# Patient Record
Sex: Female | Born: 1970 | Race: White | Hispanic: No | Smoking: Current every day smoker
Health system: Southern US, Community
[De-identification: ages and names within clinical notes are randomized; demographics above are authoritative.]

## PROBLEM LIST (undated history)

## (undated) DIAGNOSIS — F419 Anxiety disorder, unspecified: Secondary | ICD-10-CM

## (undated) DIAGNOSIS — R112 Nausea with vomiting, unspecified: Secondary | ICD-10-CM

## (undated) DIAGNOSIS — G43909 Migraine, unspecified, not intractable, without status migrainosus: Secondary | ICD-10-CM

## (undated) DIAGNOSIS — F32A Depression, unspecified: Secondary | ICD-10-CM

## (undated) DIAGNOSIS — Z9889 Other specified postprocedural states: Secondary | ICD-10-CM

## (undated) DIAGNOSIS — K760 Fatty (change of) liver, not elsewhere classified: Secondary | ICD-10-CM

## (undated) DIAGNOSIS — F329 Major depressive disorder, single episode, unspecified: Secondary | ICD-10-CM

## (undated) DIAGNOSIS — N920 Excessive and frequent menstruation with regular cycle: Secondary | ICD-10-CM

## (undated) DIAGNOSIS — Z923 Personal history of irradiation: Secondary | ICD-10-CM

## (undated) DIAGNOSIS — D649 Anemia, unspecified: Secondary | ICD-10-CM

## (undated) DIAGNOSIS — T8859XA Other complications of anesthesia, initial encounter: Secondary | ICD-10-CM

## (undated) DIAGNOSIS — C801 Malignant (primary) neoplasm, unspecified: Secondary | ICD-10-CM

## (undated) DIAGNOSIS — G629 Polyneuropathy, unspecified: Secondary | ICD-10-CM

## (undated) DIAGNOSIS — T4145XA Adverse effect of unspecified anesthetic, initial encounter: Secondary | ICD-10-CM

## (undated) DIAGNOSIS — R51 Headache: Secondary | ICD-10-CM

## (undated) HISTORY — DX: Personal history of irradiation: Z92.3

## (undated) HISTORY — PX: CHOLECYSTECTOMY: SHX55

## (undated) HISTORY — DX: Excessive and frequent menstruation with regular cycle: N92.0

## (undated) HISTORY — PX: TUBAL LIGATION: SHX77

## (undated) HISTORY — DX: Anemia, unspecified: D64.9

---

## 1999-12-06 ENCOUNTER — Other Ambulatory Visit: Admission: RE | Admit: 1999-12-06 | Discharge: 1999-12-06 | Payer: Self-pay | Admitting: Family Medicine

## 2000-01-04 ENCOUNTER — Emergency Department (HOSPITAL_COMMUNITY): Admission: EM | Admit: 2000-01-04 | Discharge: 2000-01-05 | Payer: Self-pay | Admitting: Emergency Medicine

## 2001-05-26 ENCOUNTER — Other Ambulatory Visit: Admission: RE | Admit: 2001-05-26 | Discharge: 2001-05-26 | Payer: Self-pay | Admitting: Specialist

## 2008-04-27 ENCOUNTER — Inpatient Hospital Stay (HOSPITAL_COMMUNITY): Admission: AD | Admit: 2008-04-27 | Discharge: 2008-04-28 | Payer: Self-pay | Admitting: Obstetrics & Gynecology

## 2010-06-20 LAB — WET PREP, GENITAL: Clue Cells Wet Prep HPF POC: NONE SEEN

## 2010-06-20 LAB — CBC
HCT: 40 % (ref 36.0–46.0)
Hemoglobin: 13.5 g/dL (ref 12.0–15.0)
MCHC: 33.8 g/dL (ref 30.0–36.0)
MCV: 87.5 fL (ref 78.0–100.0)
RDW: 13.6 % (ref 11.5–15.5)

## 2010-06-20 LAB — GC/CHLAMYDIA PROBE AMP, GENITAL
Chlamydia, DNA Probe: NEGATIVE
GC Probe Amp, Genital: NEGATIVE

## 2010-06-20 LAB — SAMPLE TO BLOOD BANK

## 2012-11-14 ENCOUNTER — Emergency Department (HOSPITAL_BASED_OUTPATIENT_CLINIC_OR_DEPARTMENT_OTHER): Payer: Self-pay

## 2012-11-14 ENCOUNTER — Emergency Department (HOSPITAL_BASED_OUTPATIENT_CLINIC_OR_DEPARTMENT_OTHER)
Admission: EM | Admit: 2012-11-14 | Discharge: 2012-11-14 | Disposition: A | Discharge status: Home / Self Care | Payer: Self-pay | Attending: Emergency Medicine | Admitting: Emergency Medicine

## 2012-11-14 ENCOUNTER — Encounter (HOSPITAL_BASED_OUTPATIENT_CLINIC_OR_DEPARTMENT_OTHER): Payer: Self-pay | Admitting: Emergency Medicine

## 2012-11-14 DIAGNOSIS — N939 Abnormal uterine and vaginal bleeding, unspecified: Secondary | ICD-10-CM | POA: Insufficient documentation

## 2012-11-14 DIAGNOSIS — Z3202 Encounter for pregnancy test, result negative: Secondary | ICD-10-CM | POA: Insufficient documentation

## 2012-11-14 DIAGNOSIS — N949 Unspecified condition associated with female genital organs and menstrual cycle: Secondary | ICD-10-CM | POA: Insufficient documentation

## 2012-11-14 DIAGNOSIS — N926 Irregular menstruation, unspecified: Secondary | ICD-10-CM | POA: Insufficient documentation

## 2012-11-14 LAB — CBC WITH DIFFERENTIAL/PLATELET
Basophils Absolute: 0 10*3/uL (ref 0.0–0.1)
Eosinophils Relative: 3 % (ref 0–5)
Lymphocytes Relative: 28 % (ref 12–46)
Monocytes Relative: 7 % (ref 3–12)
Neutrophils Relative %: 62 % (ref 43–77)
Platelets: 529 10*3/uL — ABNORMAL HIGH (ref 150–400)
RBC: 4.42 MIL/uL (ref 3.87–5.11)
RDW: 17 % — ABNORMAL HIGH (ref 11.5–15.5)
WBC: 13.4 10*3/uL — ABNORMAL HIGH (ref 4.0–10.5)

## 2012-11-14 LAB — PREGNANCY, URINE: Preg Test, Ur: NEGATIVE

## 2012-11-14 MED ORDER — MEGESTROL ACETATE 40 MG PO TABS: ORAL_TABLET | ORAL | Status: DC

## 2012-11-14 MED ORDER — ONDANSETRON HCL 4 MG/2ML IJ SOLN
INTRAMUSCULAR | Status: AC
  Administered 2012-11-14: 4 mg
  Filled 2012-11-14: qty 2

## 2012-11-14 NOTE — ED Provider Notes (Signed)
CSN: [id number]     Arrival date & time 11/14/12  1929 History   First MD Initiated Contact with Patient 11/14/12 2121     Chief Complaint  Patient presents with  . Dysmenorrhea   (Consider location/radiation/quality/duration/timing/severity/associated sxs/prior Treatment) Patient is a 42 y.o. female presenting with vaginal bleeding. The history is provided by the patient. No language interpreter was used.  Vaginal Bleeding Quality:  Bright red Severity:  Moderate Onset quality:  Sudden Timing:  Constant Progression:  Worsening Chronicity:  New Number of pads used:  1 an hour Possible pregnancy: no   Relieved by:  Nothing Worsened by:  Nothing tried Associated symptoms: no abdominal pain   Risk factors: gynecological surgery     No past medical history on file. No past surgical history on file. No family history on file. History  Substance Use Topics  . Smoking status: Never Smoker   . Smokeless tobacco: Not on file  . Alcohol Use: No   OB History   Grav Para Term Preterm Abortions TAB SAB Ect Mult Living                 Review of Systems  Gastrointestinal: Negative for abdominal pain.  Genitourinary: Positive for vaginal bleeding and vaginal pain.  All other systems reviewed and are negative.    Allergies  Review of patient's allergies indicates no known allergies.  Home Medications   Current Outpatient Rx  Name  Route  Sig  Dispense  Refill  . ibuprofen (ADVIL,MOTRIN) 200 MG tablet   Oral   Take 400 mg by mouth every 6 (six) hours as needed for pain.          BP 125/53  Pulse 65  Temp(Src) 99.3 F (37.4 C) (Oral)  Resp 16  Ht 5\' 7"  (1.702 m)  Wt 239 lb (108.41 kg)  BMI 37.42 kg/m2  SpO2 100%  LMP 11/10/2012 Physical Exam  Nursing note and vitals reviewed. Constitutional: She is oriented to person, place, and time. She appears well-developed.  HENT:  Head: Normocephalic and atraumatic.  Eyes: Pupils are equal, round, and reactive to light.   Neck: Normal range of motion.  Cardiovascular: Normal rate and normal heart sounds.   Pulmonary/Chest: Effort normal and breath sounds normal.  Abdominal: Soft. Bowel sounds are normal.  Genitourinary: Vagina normal.  Vaginal bleeding  Moderate,  Uterus firm to palpation  Musculoskeletal: Normal range of motion.  Neurological: She is alert and oriented to person, place, and time. She has normal reflexes.  Skin: Skin is warm.  Psychiatric: She has a normal mood and affect.    ED Course  Procedures (including critical care time) Labs Review Labs Reviewed  CBC WITH DIFFERENTIAL - Abnormal; Notable for the following:    WBC 13.4 (*)    Hemoglobin 9.9 (*)    HCT 32.3 (*)    MCV 73.1 (*)    MCH 22.4 (*)    RDW 17.0 (*)    Platelets 529 (*)    Neutro Abs 8.3 (*)    All other components within normal limits  PREGNANCY, URINE   Imaging Review US Transvaginal Non-ob  11/14/2012   CLINICAL DATA:  Vaginal bleeding.  EXAM: TRANSABDOMINAL AND TRANSVAGINAL ULTRASOUND OF PELVIS  TECHNIQUE: Both transabdominal and transvaginal ultrasound examinations of the pelvis were performed. Transabdominal technique was performed for global imaging of the pelvis including uterus, ovaries, adnexal regions, and pelvic cul-de-sac. It was necessary to proceed with endovaginal exam following the transabdominal exam to visualize the  uterus, endometrium, ovaries and adnexa.  COMPARISON:  04/28/2008  FINDINGS: Uterus  Measurements: 10.4 x 6.1 x 7.1 cm. Mildly prominent. No focal abnormality. Normal echotexture.  Endometrium  Thickness: Markedly thickened, 30 mm mildly heterogeneous echotexture.  Right ovary  Measurements: 3.3 x 2.5 x 2.9 cm. Normal appearance/no adnexal mass.  Left ovary  Measurements: Unable to move visualized due to overlying bowel gas. No adnexal mass seen.  Other findings  No free fluid.  IMPRESSION: Markedly thickened endometrium at 3 mm. Consider further evaluation with endometrial biopsy.    Electronically Signed   By: Charlett Nose M.D.   On: 11/14/2012 22:41   US Pelvis Complete  11/14/2012   CLINICAL DATA:  Vaginal bleeding.  EXAM: TRANSABDOMINAL AND TRANSVAGINAL ULTRASOUND OF PELVIS  TECHNIQUE: Both transabdominal and transvaginal ultrasound examinations of the pelvis were performed. Transabdominal technique was performed for global imaging of the pelvis including uterus, ovaries, adnexal regions, and pelvic cul-de-sac. It was necessary to proceed with endovaginal exam following the transabdominal exam to visualize the uterus, endometrium, ovaries and adnexa.  COMPARISON:  04/28/2008  FINDINGS: Uterus  Measurements: 10.4 x 6.1 x 7.1 cm. Mildly prominent. No focal abnormality. Normal echotexture.  Endometrium  Thickness: Markedly thickened, 30 mm mildly heterogeneous echotexture.  Right ovary  Measurements: 3.3 x 2.5 x 2.9 cm. Normal appearance/no adnexal mass.  Left ovary  Measurements: Unable to move visualized due to overlying bowel gas. No adnexal mass seen.  Other findings  No free fluid.  IMPRESSION: Markedly thickened endometrium at 3 mm. Consider further evaluation with endometrial biopsy.   Electronically Signed   By: Charlett Nose M.D.   On: 11/14/2012 22:41    MDM   1. Abnormal vaginal bleeding    I spoke to dr. Despina Hidden.   Gyn on call.  He advised Megace 40 mg tid x 5 days,  Then bid x 5 days then one a day.   Call gyn clinic for evaluation.   [facility] increases    Elson Areas, PA-C 11/14/12 2301  Lonia Skinner [place], [place] 11/14/12 2311

## 2012-11-14 NOTE — ED Provider Notes (Signed)
Medical screening examination/treatment/procedure(s) were performed by non-physician practitioner and as supervising physician I was immediately available for consultation/collaboration.   Candyce Churn, MD 11/14/12 [phone]

## 2012-11-14 NOTE — ED Notes (Signed)
Pt is nauseated.

## 2012-11-14 NOTE — ED Notes (Signed)
Pt having severely painful menstrual cycles with heavy bleeding.  Pt having blood clots, fatigue, and bloating.  Pt states it has progressively worsened over the last 5 months.

## 2012-11-21 ENCOUNTER — Inpatient Hospital Stay (HOSPITAL_COMMUNITY)
Admission: AD | Admit: 2012-11-21 | Discharge: 2012-11-21 | Disposition: A | Discharge status: Home / Self Care | Payer: Self-pay | Source: Ambulatory Visit | Attending: Obstetrics & Gynecology | Admitting: Obstetrics & Gynecology

## 2012-11-21 ENCOUNTER — Encounter (HOSPITAL_COMMUNITY): Payer: Self-pay | Admitting: *Deleted

## 2012-11-21 DIAGNOSIS — N938 Other specified abnormal uterine and vaginal bleeding: Secondary | ICD-10-CM | POA: Insufficient documentation

## 2012-11-21 DIAGNOSIS — N949 Unspecified condition associated with female genital organs and menstrual cycle: Secondary | ICD-10-CM | POA: Insufficient documentation

## 2012-11-21 DIAGNOSIS — R9389 Abnormal findings on diagnostic imaging of other specified body structures: Secondary | ICD-10-CM | POA: Insufficient documentation

## 2012-11-21 DIAGNOSIS — R109 Unspecified abdominal pain: Secondary | ICD-10-CM | POA: Insufficient documentation

## 2012-11-21 DIAGNOSIS — N920 Excessive and frequent menstruation with regular cycle: Secondary | ICD-10-CM

## 2012-11-21 LAB — CBC
HCT: 26.2 % — ABNORMAL LOW (ref 36.0–46.0)
Hemoglobin: 8.2 g/dL — ABNORMAL LOW (ref 12.0–15.0)
WBC: 9.6 10*3/uL (ref 4.0–10.5)

## 2012-11-21 MED ORDER — OXYCODONE-ACETAMINOPHEN 5-325 MG PO TABS
1.0000 | ORAL_TABLET | Freq: Once | ORAL | Status: AC
  Administered 2012-11-21: 1 via ORAL
  Filled 2012-11-21: qty 1

## 2012-11-21 MED ORDER — MEGESTROL ACETATE 40 MG PO TABS: ORAL_TABLET | ORAL | Status: DC

## 2012-11-21 MED ORDER — OXYCODONE-ACETAMINOPHEN 5-325 MG PO TABS: 1.0000 | ORAL_TABLET | ORAL | Status: DC | PRN

## 2012-11-21 MED ORDER — PROMETHAZINE HCL 25 MG PO TABS: 25.0000 mg | ORAL_TABLET | Freq: Four times a day (QID) | ORAL | Status: DC | PRN

## 2012-11-21 MED ORDER — ONDANSETRON 8 MG PO TBDP
8.0000 mg | ORAL_TABLET | Freq: Once | ORAL | Status: AC
  Administered 2012-11-21: 8 mg via ORAL
  Filled 2012-11-21: qty 1

## 2012-11-21 NOTE — MAU Note (Signed)
Using public restroom when called for triage.

## 2012-11-21 NOTE — MAU Note (Signed)
Pt seen @ [facility] one week ago for bleeding, was given Megace to take for 5 days.  Pt finished med, but has still been bleeding.  Pt states her bleeding is very heavy, passing clots.  Has bled for past 10 days.

## 2012-11-21 NOTE — MAU Provider Note (Signed)
History     CSN: [id number]  Arrival date and time: 11/21/12 1542   First Provider Initiated Contact with Patient 11/21/12 1645      Chief Complaint  Patient presents with  . Vaginal Bleeding   HPI Ms. METTE SOUTHGATE is a 41 y.o. G2P2 who presents to MAU today with complaint of vaginal bleeding x 10 days. The patient states that she was seen at [facility] on 11/14/12 and given Megace TID x 5 days. This slowed the bleeding for 1-2 days, but it has since resumed. She states that she is changing her pad q 1.5-2 hours. She is feeling weak, dizzy, tired and has nausea, headaches and back pain. She is also having lower abdominal cramping rated at 6-7/10. She has taken ibuprofen without relief.    OB History   Grav Para Term Preterm Abortions TAB SAB Ect Mult Living   2 2        2       Past Medical History  Diagnosis Date  . Medical history non-contributory     Past Surgical History  Procedure Laterality Date  . Cesarean section    . Tubal ligation      History reviewed. No pertinent family history.  History  Substance Use Topics  . Smoking status: Current Every Day Smoker -- 0.50 packs/day    Types: Cigarettes  . Smokeless tobacco: Not on file  . Alcohol Use: No    Allergies: No Known Allergies  No prescriptions prior to admission    Review of Systems  Constitutional: Negative for fever and malaise/fatigue.  Gastrointestinal: Positive for nausea and abdominal pain. Negative for vomiting, diarrhea and constipation.  Genitourinary:       + vaginal bleeding  Neurological: Positive for dizziness. Negative for loss of consciousness.   Physical Exam   Blood pressure 131/71, pulse 90, temperature 97.9 F (36.6 C), temperature source Oral, resp. rate 20, last menstrual period 11/12/2012.  Physical Exam  Constitutional: She is oriented to person, place, and time. She appears well-developed and well-nourished. No distress.  HENT:  Head: Normocephalic and atraumatic.   Cardiovascular: Normal rate, regular rhythm and normal heart sounds.   Respiratory: Effort normal and breath sounds normal. No respiratory distress.  GI: Soft. Bowel sounds are normal. She exhibits no distension and no mass. There is no tenderness. There is no rebound and no guarding.  Genitourinary: Uterus is tender. Uterus is not enlarged (exam limited by maternal body habitus). Cervix exhibits no motion tenderness, no discharge and no friability. Right adnexum displays no mass and no tenderness. Left adnexum displays no mass and no tenderness. There is bleeding (small amount of bleeding noted) around the vagina. No vaginal discharge found.  Neurological: She is alert and oriented to person, place, and time.  Skin: Skin is warm and dry. No erythema.  Psychiatric: She has a normal mood and affect.   Results for orders placed during the hospital encounter of 11/21/12 (from the past 24 hour(s))  CBC     Status: Abnormal   Collection Time    11/21/12  6:20 PM      Result Value Range   WBC 9.6  4.0 - 10.5 K/uL   RBC 3.68 (*) 3.87 - 5.11 MIL/uL   Hemoglobin 8.2 (*) 12.0 - 15.0 g/dL   HCT 40.9 (*) 81.1 - 91.4 %   MCV 71.2 (*) 78.0 - 100.0 fL   MCH 22.3 (*) 26.0 - 34.0 pg   MCHC 31.3  30.0 - 36.0 g/dL  RDW 17.2 (*) 11.5 - 15.5 %   Platelets 541 (*) 150 - 400 K/uL    MAU Course  Procedures None  MDM [place] from 11/14/12 shows significant endometrial thickening Patient has appointment in [facility] clinic on 12/25/12. Will send inbasket message to move up date of appointment Discussed patient with Dr. Emelda Fear. Start on Megace TID x 7 days and then daily until clinic follow-up. Will need endometrial biopsy at that appointment.   Assessment and Plan  A: DUB  P: Discharge home Rx for Megace, Percocet and Phenergan given to the patient Patient will be contacted by [facility] clinic with new appointment date/time Bleeding precautions discussed Patient advised to continue taking previously prescribed iron  supplements Patient may return to MAU as needed or if her condition were to change or worsen  Freddi Starr, PA-C  11/21/2012, 7:14 PM

## 2012-11-22 NOTE — MAU Provider Note (Signed)
Attestation of Attending Supervision of Advanced Practitioner: Evaluation and management procedures were performed by the PA/NP/CNM/OB Fellow under my supervision/collaboration. Chart reviewed and agree with management and plan.  Kenesha Moshier V 11/22/2012 12:26 AM

## 2012-11-28 ENCOUNTER — Other Ambulatory Visit (HOSPITAL_COMMUNITY)
Admission: RE | Admit: 2012-11-28 | Discharge: 2012-11-28 | Disposition: A | Discharge status: Home / Self Care | Payer: Self-pay | Source: Ambulatory Visit | Attending: Obstetrics & Gynecology | Admitting: Obstetrics & Gynecology

## 2012-11-28 ENCOUNTER — Ambulatory Visit (INDEPENDENT_AMBULATORY_CARE_PROVIDER_SITE_OTHER): Payer: Self-pay | Admitting: Obstetrics & Gynecology

## 2012-11-28 ENCOUNTER — Encounter: Payer: Self-pay | Admitting: Obstetrics & Gynecology

## 2012-11-28 VITALS — BP 110/64 | HR 69 | Temp 97.4°F | Ht 67.0 in | Wt 248.2 lb

## 2012-11-28 DIAGNOSIS — R9389 Abnormal findings on diagnostic imaging of other specified body structures: Secondary | ICD-10-CM | POA: Insufficient documentation

## 2012-11-28 DIAGNOSIS — N920 Excessive and frequent menstruation with regular cycle: Secondary | ICD-10-CM | POA: Insufficient documentation

## 2012-11-28 DIAGNOSIS — Z01812 Encounter for preprocedural laboratory examination: Secondary | ICD-10-CM

## 2012-11-28 LAB — POCT PREGNANCY, URINE: Preg Test, Ur: NEGATIVE

## 2012-11-28 MED ORDER — AMITRIPTYLINE HCL 25 MG PO TABS: 25.0000 mg | ORAL_TABLET | Freq: Every day | ORAL | Status: DC

## 2012-11-28 NOTE — Progress Notes (Signed)
Here for menorrhagia, states still taking megace , but still bleeding a lot. Also reports no energy, and fighting off depression, and having nightmares and poor sleep.

## 2012-11-28 NOTE — Patient Instructions (Addendum)
Menorrhagia Dysfunctional uterine bleeding is different from a normal menstrual period. When periods are heavy or there is more bleeding than is usual for you, it is called menorrhagia. It may be caused by hormonal imbalance, or physical, metabolic, or other problems. Examination is necessary in order that your caregiver may treat treatable causes. If this is a continuing problem, a D&C may be needed. That means that the cervix (the opening of the uterus or womb) is dilated (stretched larger) and the lining of the uterus is scraped out. The tissue scraped out is then examined under a microscope by a specialist (pathologist) to make sure there is nothing of concern that needs further or more extensive treatment. HOME CARE INSTRUCTIONS   If medications were prescribed, take exactly as directed. Do not change or switch medications without consulting your caregiver.  Long term heavy bleeding may result in iron deficiency. Your caregiver may have prescribed iron pills. They help replace the iron your body lost from heavy bleeding. Take exactly as directed. Iron may cause constipation. If this becomes a problem, increase the bran, fruits, and roughage in your diet.  Do not take aspirin or medicines that contain aspirin one week before or during your menstrual period. Aspirin may make the bleeding worse.  If you need to change your sanitary pad or tampon more than once every 2 hours, stay in bed and rest as much as possible until the bleeding stops.  Eat well-balanced meals. Eat foods high in iron. Examples are leafy green vegetables, meat, liver, eggs, and whole grain breads and cereals. Do not try to lose weight until the abnormal bleeding has stopped and your blood iron level is back to normal. SEEK MEDICAL CARE IF:   You need to change your sanitary pad or tampon more than once an hour.  You develop nausea (feeling sick to your stomach) and vomiting, dizziness, or diarrhea while you are taking your  medicine.  You have any problems that may be related to the medicine you are taking. SEEK IMMEDIATE MEDICAL CARE IF:   You have a fever.  You develop chills.  You develop severe bleeding or start to pass blood clots.  You feel dizzy or faint. MAKE SURE YOU:   Understand these instructions.  Will watch your condition.  Will get help right away if you are not doing well or get worse. Document Released: 02/19/2005 Document Revised: 05/14/2011 Document Reviewed: 10/10/2007 [facility] Patient Information 2014 [place], [place]. Endometrial Ablation Endometrial ablation removes the lining of the uterus (endometrium). It is usually a same day, outpatient treatment. Ablation helps avoid major surgery (such as a hysterectomy). A hysterectomy is removal of the cervix and uterus. Endometrial ablation has less risk and complications, has a shorter recovery period and is less expensive. After endometrial ablation, most women will have little or no menstrual bleeding. You may not keep your fertility. Pregnancy is no longer likely after this procedure but if you are pre-menopausal, you still need to use a reliable method of birth control following the procedure because pregnancy can occur. REASONS TO HAVE THE PROCEDURE MAY INCLUDE:  Heavy periods.  Bleeding that is causing anemia.  Anovulatory bleeding, very irregular, bleeding.  Bleeding submucous fibroids (on the lining inside the uterus) if they are smaller than 3 centimeters. REASONS NOT TO HAVE THE PROCEDURE MAY INCLUDE:  You wish to have more children.  You have a pre-cancerous or cancerous problem. The cause of any abnormal bleeding must be diagnosed before having the procedure.  You  have pain coming from the uterus.  You have a submucus fibroid larger than 3 centimeters.  You recently had a baby.  You recently had an infection in the uterus.  You have a severe retro-flexed, tipped uterus and cannot insert the instrument to do the  ablation.  You had a Cesarean section or deep major surgery on the uterus.  The inner cavity of the uterus is too large for the endometrial ablation instrument. RISKS AND COMPLICATIONS   Perforation of the uterus.  Bleeding.  Infection of the uterus, bladder or vagina.  Injury to surrounding organs.  Cutting the cervix.  An air bubble to the lung (air embolus).  Pregnancy following the procedure.  Failure of the procedure to help the problem requiring hysterectomy.  Decreased ability to diagnose cancer in the lining of the uterus. BEFORE THE PROCEDURE  The lining of the uterus must be tested to make sure there is no pre-cancerous or cancer cells present.  Medications may be given to make the lining of the uterus thinner.  Ultrasound may be used to evaluate the size and look for abnormalities of the uterus.  Future pregnancy is not desired. PROCEDURE  There are different ways to destroy the lining of the uterus.   Resectoscope - radio frequency-alternating electric current is the most common one used.  Cryotherapy - freezing the lining of the uterus.  Heated Free Liquid - heated salt (saline) solution inserted into the uterus.  Microwave - uses high energy microwaves in the uterus.  Thermal Balloon - a catheter with a balloon tip is inserted into the uterus and filled with heated fluid. Your caregiver will talk with you about the method used in this clinic. They will also instruct you on the pros and cons of the procedure. Endometrial ablation is performed along with a procedure called operative hysteroscopy. A narrow viewing tube is inserted through the birth canal (vagina) and through the cervix into the uterus. A tiny camera attached to the viewing tube (hysteroscope) allows the uterine cavity to be shown on a TV monitor during surgery. Your uterus is filled with a harmless liquid to make the procedure easier. The lining of the uterus is then removed. The lining can  also be removed with a resectoscope which allows your surgeon to cut away the lining of the uterus under direct vision. Usually, you will be able to go home within an hour after the procedure. HOME CARE INSTRUCTIONS   Do not drive for 24 hours.  No tampons, douching or intercourse for 2 weeks or until your caregiver approves.  Rest at home for 24 to 48 hours. You may then resume normal activities unless told differently by your caregiver.  Take your temperature two times a day for 4 days, and record it.  Take any medications your caregiver has ordered, as directed.  Use some form of contraception if you are pre-menopausal and do not want to get pregnant. Bleeding after the procedure is normal. It varies from light spotting and mildly watery to bloody discharge for 4 to 6 weeks. You may also have mild cramping. Only take over-the-counter or prescription medicines for pain, discomfort, or fever as directed by your caregiver. Do not use aspirin, as this may aggravate bleeding. Frequent urination during the first 24 hours is normal. You will not know how effective your surgery is until at least 3 months after the surgery. SEEK IMMEDIATE MEDICAL CARE IF:   Bleeding is heavier than a normal menstrual cycle.  An oral  temperature above 102 F (38.9 C) develops.  You have increasing cramps or pains not relieved with medication or develop belly (abdominal) pain which does not seem to be related to the same area of earlier cramping and pain.  You are light headed, weak or have fainting episodes.  You develop pain in the shoulder strap areas.  You have chest or leg pain.  You have abnormal vaginal discharge.  You have painful urination. Document Released: 12/30/2003 Document Revised: 05/14/2011 Document Reviewed: 03/29/2007 [facility] Patient Information 2014 [place], [place]. Levonorgestrel intrauterine device (IUD) What is this medicine? LEVONORGESTREL IUD (LEE voe nor jes trel) is a  contraceptive (birth control) device. The device is placed inside the uterus by a healthcare professional. It is used to prevent pregnancy and can also be used to treat heavy bleeding that occurs during your period. Depending on the device, it can be used for 3 to 5 years. This medicine may be used for other purposes; ask your health care provider or pharmacist if you have questions. What should I tell my health care provider before I take this medicine? They need to know if you have any of these conditions: -abnormal Pap smear -cancer of the breast, uterus, or cervix -diabetes -endometritis -genital or pelvic infection now or in the past -have more than one sexual partner or your partner has more than one partner -heart disease -history of an ectopic or tubal pregnancy -immune system problems -IUD in place -liver disease or tumor -problems with blood clots or take blood-thinners -use intravenous drugs -uterus of unusual shape -vaginal bleeding that has not been explained -an unusual or allergic reaction to levonorgestrel, other hormones, silicone, or polyethylene, medicines, foods, dyes, or preservatives -pregnant or trying to get pregnant -breast-feeding How should I use this medicine? This device is placed inside the uterus by a health care professional. Talk to your pediatrician regarding the use of this medicine in children. Special care may be needed. Overdosage: If you think you have taken too much of this medicine contact a poison control center or emergency room at once. NOTE: This medicine is only for you. Do not share this medicine with others. What if I miss a dose? This does not apply. What may interact with this medicine? Do not take this medicine with any of the following medications: -amprenavir -bosentan -fosamprenavir This medicine may also interact with the following medications: -aprepitant -barbiturate medicines for inducing sleep or treating  seizures -bexarotene -griseofulvin -medicines to treat seizures like carbamazepine, ethotoin, felbamate, oxcarbazepine, phenytoin, topiramate -modafinil -pioglitazone -rifabutin -rifampin -rifapentine -some medicines to treat HIV infection like atazanavir, indinavir, lopinavir, nelfinavir, tipranavir, ritonavir -St. John's wort -warfarin This list may not describe all possible interactions. Give your health care provider a list of all the medicines, herbs, non-prescription drugs, or dietary supplements you use. Also tell them if you smoke, drink alcohol, or use illegal drugs. Some items may interact with your medicine. What should I watch for while using this medicine? Visit your doctor or health care professional for regular check ups. See your doctor if you or your partner has sexual contact with others, becomes HIV positive, or gets a sexual transmitted disease. This product does not protect you against HIV infection (AIDS) or other sexually transmitted diseases. You can check the placement of the IUD yourself by reaching up to the top of your vagina with clean fingers to feel the threads. Do not pull on the threads. It is a good habit to check placement after each menstrual period.  Call your doctor right away if you feel more of the IUD than just the threads or if you cannot feel the threads at all. The IUD may come out by itself. You may become pregnant if the device comes out. If you notice that the IUD has come out use a backup birth control method like condoms and call your health care provider. Using tampons will not change the position of the IUD and are okay to use during your period. What side effects may I notice from receiving this medicine? Side effects that you should report to your doctor or health care professional as soon as possible: -allergic reactions like skin rash, itching or hives, swelling of the face, lips, or tongue -fever, flu-like symptoms -genital sores -high  blood pressure -no menstrual period for 6 weeks during use -pain, swelling, warmth in the leg -pelvic pain or tenderness -severe or sudden headache -signs of pregnancy -stomach cramping -sudden shortness of breath -trouble with balance, talking, or walking -unusual vaginal bleeding, discharge -yellowing of the eyes or skin Side effects that usually do not require medical attention (report to your doctor or health care professional if they continue or are bothersome): -acne -breast pain -change in sex drive or performance -changes in weight -cramping, dizziness, or faintness while the device is being inserted -headache -irregular menstrual bleeding within first 3 to 6 months of use -nausea This list may not describe all possible side effects. Call your doctor for medical advice about side effects. You may report side effects to FDA at 1-800-FDA-1088. Where should I keep my medicine? This does not apply. NOTE: This sheet is a summary. It may not cover all possible information. If you have questions about this medicine, talk to your doctor, pharmacist, or health care provider.  2013, [employer]. (03/22/2011 1:54:04 PM)

## 2012-11-28 NOTE — Progress Notes (Signed)
Patient ID: Sheryl Mathis, female   DOB: [birth year], 42 y.o.   MRN: [id number]  Chief Complaint  Patient presents with  . Dysmenorrhea  . Menorrhagia    HPI Sheryl Mathis is a 42 y.o. female.  [id number] Patient's last menstrual period was 11/12/2012. Increasing menstrual bleeding and cramps over last few months, seen in ED twice and last 2 weeks, now on Megace taper with some improvement but still bleeding. Tearful and bad dreams since taking Megace.  HPI  Past Medical History  Diagnosis Date  . Medical history non-contributory   . Anemia   . Menorrhagia     Past Surgical History  Procedure Laterality Date  . Cesarean section    . Tubal ligation      Family History  Problem Relation Age of Onset  . Diabetes Mother     Social History History  Substance Use Topics  . Smoking status: Current Every Day Smoker -- 0.50 packs/day    Types: Cigarettes  . Smokeless tobacco: Not on file  . Alcohol Use: No    No Known Allergies  Current Outpatient Prescriptions  Medication Sig Dispense Refill  . Ferrous Sulfate (IRON) 28 MG TABS Take 1 tablet by mouth daily. 256 mg of ferrous gluconate      . ibuprofen (ADVIL,MOTRIN) 200 MG tablet Take 400 mg by mouth every 6 (six) hours as needed for pain.      . megestrol (MEGACE) 40 MG tablet Take 1 tab (40 mg) TID x 7 days and then 1 tab (40 mg) daily  60 tablet  0  . Multiple Vitamins-Minerals (ALIVE WOMENS ENERGY) TABS Take 1 tablet by mouth daily.      Marland Kitchen oxyCODONE-acetaminophen (PERCOCET/ROXICET) 5-325 MG per tablet Take 1 tablet by mouth every 4 (four) hours as needed for pain.  20 tablet  0  . promethazine (PHENERGAN) 25 MG tablet Take 1 tablet (25 mg total) by mouth every 6 (six) hours as needed for nausea.  30 tablet  0   No current facility-administered medications for this visit.    Review of Systems Review of Systems  Constitutional: Positive for fever.  Genitourinary: Positive for vaginal bleeding and pelvic pain (cramps).  Negative for dysuria, frequency and vaginal pain.  Psychiatric/Behavioral: Positive for sleep disturbance and dysphoric mood.    Blood pressure 110/64, pulse 69, temperature 97.4 F (36.3 C), height 5\' 7"  (1.702 m), weight 248 lb 3.2 oz (112.583 kg), last menstrual period 11/12/2012.  Physical Exam Physical Exam  Constitutional: She is oriented to person, place, and time. She appears well-developed. No distress.  obese  Pulmonary/Chest: Effort normal. No respiratory distress.  Abdominal: Soft. There is tenderness.  Genitourinary: Vagina normal and uterus normal.  Moderate bleeding, cervix normal, no mass  Neurological: She is alert and oriented to person, place, and time.  Skin: Skin is warm and dry.  Psychiatric: She has a normal mood and affect. Her behavior is normal.   Patient given informed consent, signed copy in the chart, time out was performed. Appropriate time out taken. . The patient was placed in the lithotomy position and the cervix brought into view with sterile speculum.  Portio of cervix cleansed x 2 with betadine swabs.  A tenaculum was placed in the anterior lip of the cervix.  The uterus was sounded for depth of 10 cm. A pipelle was introduced to into the uterus, suction created,  and an endometrial sample was obtained, with blood. All equipment was removed and accounted for.  The patient tolerated the procedure well.    Patient given post procedure instructions. The patient will return in 2 weeks for results. Data Reviewed   AssesCLINICAL DATA: Vaginal bleeding.  EXAM:  TRANSABDOMINAL AND TRANSVAGINAL ULTRASOUND OF PELVIS  TECHNIQUE:  Both transabdominal and transvaginal ultrasound examinations of the  pelvis were performed. Transabdominal technique was performed for  global imaging of the pelvis including uterus, ovaries, adnexal  regions, and pelvic cul-de-sac. It was necessary to proceed with  endovaginal exam following the transabdominal exam to visualize the   uterus, endometrium, ovaries and adnexa.  COMPARISON: 04/28/2008  FINDINGS:  Uterus  Measurements: 10.4 x 6.1 x 7.1 cm. Mildly prominent. No focal  abnormality. Normal echotexture.  Endometrium  Thickness: Markedly thickened, 30 mm mildly heterogeneous  echotexture.  Right ovary  Measurements: 3.3 x 2.5 x 2.9 cm. Normal appearance/no adnexal mass.  Left ovary  Measurements: Unable to move visualized due to overlying bowel gas.  No adnexal mass seen.  Other findings  No free fluid.  IMPRESSION:  Markedly thickened endometrium at 3 mm. Consider further evaluation  with endometrial biopsy.  Electronically Signed  By: Charlett Nose M.D.  On: 11/14/2012 22:41     Thickened endometrium, menorrhagia   Mood side-effect from progestin  Plan    RTC for EMB result, consider Mirena, ablation Amitriptyline 25 mg po hs sleep       ARNOLD,JAMES 11/28/2012, 10:16 AM

## 2012-12-01 ENCOUNTER — Encounter: Payer: Self-pay | Admitting: Obstetrics and Gynecology

## 2012-12-09 ENCOUNTER — Telehealth: Payer: Self-pay | Admitting: *Deleted

## 2012-12-09 NOTE — Telephone Encounter (Addendum)
Message copied by Jill Side on Tue Dec 09, 2012 11:47 AM ------      Message from: Adam Phenix      Created: Fri Dec 05, 2012 11:50 AM       RTC for result, benign endometrium ------ Called pt and left message that Dr. Debroah Loop would like her to call and schedule f/u appt in clinic to discuss test results and formulate plan of care. Please call the appt line to schedule the appt.

## 2012-12-15 ENCOUNTER — Inpatient Hospital Stay (HOSPITAL_COMMUNITY)
Admission: AD | Admit: 2012-12-15 | Discharge: 2012-12-15 | Disposition: A | Discharge status: Home / Self Care | Payer: Self-pay | Source: Ambulatory Visit | Attending: Obstetrics & Gynecology | Admitting: Obstetrics & Gynecology

## 2012-12-15 ENCOUNTER — Encounter (HOSPITAL_COMMUNITY): Payer: Self-pay | Admitting: *Deleted

## 2012-12-15 DIAGNOSIS — N938 Other specified abnormal uterine and vaginal bleeding: Secondary | ICD-10-CM | POA: Insufficient documentation

## 2012-12-15 DIAGNOSIS — R51 Headache: Secondary | ICD-10-CM | POA: Insufficient documentation

## 2012-12-15 DIAGNOSIS — N920 Excessive and frequent menstruation with regular cycle: Secondary | ICD-10-CM

## 2012-12-15 DIAGNOSIS — N949 Unspecified condition associated with female genital organs and menstrual cycle: Secondary | ICD-10-CM | POA: Insufficient documentation

## 2012-12-15 DIAGNOSIS — R112 Nausea with vomiting, unspecified: Secondary | ICD-10-CM | POA: Insufficient documentation

## 2012-12-15 DIAGNOSIS — N970 Female infertility associated with anovulation: Secondary | ICD-10-CM

## 2012-12-15 HISTORY — DX: Major depressive disorder, single episode, unspecified: F32.9

## 2012-12-15 HISTORY — DX: Depression, unspecified: F32.A

## 2012-12-15 HISTORY — DX: Headache: R51

## 2012-12-15 LAB — CBC WITH DIFFERENTIAL/PLATELET
Basophils Absolute: 0.1 10*3/uL (ref 0.0–0.1)
Basophils Relative: 1 % (ref 0–1)
HCT: 25.5 % — ABNORMAL LOW (ref 36.0–46.0)
Hemoglobin: 7.6 g/dL — ABNORMAL LOW (ref 12.0–15.0)
Lymphocytes Relative: 30 % (ref 12–46)
MCHC: 29.8 g/dL — ABNORMAL LOW (ref 30.0–36.0)
Monocytes Absolute: 0.8 10*3/uL (ref 0.1–1.0)
Neutro Abs: 5.9 10*3/uL (ref 1.7–7.7)
Neutrophils Relative %: 58 % (ref 43–77)
RDW: 17.2 % — ABNORMAL HIGH (ref 11.5–15.5)
WBC: 10.2 10*3/uL (ref 4.0–10.5)

## 2012-12-15 LAB — URINALYSIS, ROUTINE W REFLEX MICROSCOPIC
Glucose, UA: NEGATIVE mg/dL
Ketones, ur: NEGATIVE mg/dL
Nitrite: NEGATIVE
Protein, ur: NEGATIVE mg/dL
pH: 6 (ref 5.0–8.0)

## 2012-12-15 LAB — URINE MICROSCOPIC-ADD ON

## 2012-12-15 MED ORDER — BUTALBITAL-APAP-CAFFEINE 50-325-40 MG PO TABS: 1.0000 | ORAL_TABLET | Freq: Four times a day (QID) | ORAL | Status: DC | PRN

## 2012-12-15 MED ORDER — NORETHINDRONE-ETH ESTRADIOL 0.4-35 MG-MCG PO TABS: 1.0000 | ORAL_TABLET | Freq: Every day | ORAL | Status: DC

## 2012-12-15 MED ORDER — IRON 28 MG PO TABS: 1.0000 | ORAL_TABLET | Freq: Two times a day (BID) | ORAL | Status: DC

## 2012-12-15 MED ORDER — BUTALBITAL-APAP-CAFFEINE 50-325-40 MG PO TABS
1.0000 | ORAL_TABLET | Freq: Once | ORAL | Status: AC
  Administered 2012-12-15: 1 via ORAL
  Filled 2012-12-15: qty 1

## 2012-12-15 NOTE — MAU Note (Signed)
"  same as before"; headache, nausea, passing clots, fatigue. meds make her sleepy.  Makes it hard to work. Going on for over 34days.

## 2012-12-15 NOTE — MAU Provider Note (Signed)
History     CSN: [id number]  Arrival date and time: 12/15/12 1435   None     Chief Complaint  Patient presents with  . Headache  . Nausea  . Fatigue  . Vaginal Bleeding   HPI [name] is a 42 y.o female that presents for HA, N/V, and vaginal bleeding. She says she has large clots that come out periodically. She says that she normally goes through in a day is 10. She denies any other discharge. She was prescribed megace for vaginal bleeding on 11/14/12. She says that helped the bleeding some but has not resolved it. She was biopsied in clinic but she has not gotten the results yet. The headaches come on progressively during the day. She is positive for light sensitivity but not sensitive for sound. She complains of nausea and vomiting with the headaches. She denies aura. She takes Advil PRN for the HA but it does not work. She denies previous diagnosis or FH of migraines.    OB History   Grav Para Term Preterm Abortions TAB SAB Ect Mult Living   2 2 2       2       Past Medical History  Diagnosis Date  . Medical history non-contributory   . Anemia   . Menorrhagia   . Headache(784.0)   . Depression     Past Surgical History  Procedure Laterality Date  . Cesarean section    . Tubal ligation      Family History  Problem Relation Age of Onset  . Diabetes Mother     History  Substance Use Topics  . Smoking status: Current Every Day Smoker -- 0.50 packs/day    Types: Cigarettes  . Smokeless tobacco: Not on file  . Alcohol Use: No    Allergies: No Known Allergies  Prescriptions prior to admission  Medication Sig Dispense Refill  . amitriptyline (ELAVIL) 25 MG tablet Take 1 tablet (25 mg total) by mouth at bedtime.  20 tablet  1  . Ferrous Sulfate (IRON) 28 MG TABS Take 1 tablet by mouth daily. 256 mg of ferrous gluconate      . ibuprofen (ADVIL,MOTRIN) 200 MG tablet Take 400 mg by mouth every 6 (six) hours as needed for pain.      . megestrol (MEGACE) 40 MG tablet  Take 1 tab (40 mg) TID x 7 days and then 1 tab (40 mg) daily  60 tablet  0  . Multiple Vitamins-Minerals (ALIVE WOMENS ENERGY) TABS Take 1 tablet by mouth daily.      Marland Kitchen oxyCODONE-acetaminophen (PERCOCET/ROXICET) 5-325 MG per tablet Take 1 tablet by mouth every 4 (four) hours as needed for pain.  20 tablet  0  . promethazine (PHENERGAN) 25 MG tablet Take 1 tablet (25 mg total) by mouth every 6 (six) hours as needed for nausea.  30 tablet  0    Review of Systems  Constitutional: Negative for fever and chills.  HENT: Negative for congestion and sore throat.   Respiratory: Negative for cough, shortness of breath and wheezing.   Cardiovascular: Negative for chest pain and palpitations.  Gastrointestinal: Positive for nausea, vomiting and abdominal pain. Negative for heartburn, diarrhea and constipation.  Genitourinary: Negative for dysuria, urgency and frequency.  Skin: Negative.  Negative for itching and rash.  Neurological: Positive for headaches.   Physical Exam   Blood pressure 128/84, pulse 87, temperature 98.9 F (37.2 C), temperature source Oral, resp. rate 20, height 5' 6.5" (1.689 m), weight 111.585  kg (246 lb), last menstrual period 11/12/2012.  Physical Exam  Constitutional: She is oriented to person, place, and time. She appears well-developed and well-nourished. No distress.  HENT:  Head: Normocephalic and atraumatic.  Cardiovascular: Normal rate, regular rhythm, normal heart sounds and intact distal pulses.   Respiratory: Effort normal and breath sounds normal.  GI: Soft. Bowel sounds are normal.  Neurological: She is alert and oriented to person, place, and time.  Skin: Skin is warm and dry. She is not diaphoretic.  Psychiatric: She has a normal mood and affect.    MAU Course  Procedures   Assessment and Plan   Patient is a 42 y.o female that present for vaginal bleeding and HA. Collect   Bing Plume 12/15/2012, 4:44 PM   CBC    Component Value Date/Time    WBC 10.2 12/15/2012 1703   RBC 3.57* 12/15/2012 1703   HGB 7.6* 12/15/2012 1703   HCT 25.5* 12/15/2012 1703   PLT 367 12/15/2012 1703   MCV 71.4* 12/15/2012 1703   MCH 21.3* 12/15/2012 1703   MCHC 29.8* 12/15/2012 1703   RDW 17.2* 12/15/2012 1703   LYMPHSABS 3.1 12/15/2012 1703   MONOABS 0.8 12/15/2012 1703   EOSABS 0.3 12/15/2012 1703   BASOSABS 0.1 12/15/2012 1703    Pt slowly down trending but hemodynamically stable. Megace not stopping the bleeding. Reviewed EMB, no hyperplasia or malignancy. Will start ovcon35 taper for treatment. Will also give fioricet for headache. Pt states understanding. -ovcon 35 3tabs for 3days 2 tabs for 3days 1 tab daily to complete pack and next pack. May take sugar pills and continue next pack. - fioricet for headaches.  F/u for worsening symptoms. Scheduled to see Dr. Debroah Loop on 30October for definitive treatment.  Tawana Scale, MD OB Fellow

## 2012-12-15 NOTE — MAU Note (Signed)
Pt tearful with unrelieved headache ; took 400mg  advil this morning .

## 2012-12-23 NOTE — MAU Provider Note (Signed)
Pt should not take placebo pills during the first pack cut can take them for the second pack.    Agree with above note.  Brookie Wayment H. 12/23/2012 9:04 AM

## 2012-12-25 ENCOUNTER — Encounter: Payer: Self-pay | Admitting: Medical

## 2012-12-31 ENCOUNTER — Encounter (HOSPITAL_COMMUNITY): Payer: Self-pay | Admitting: *Deleted

## 2012-12-31 ENCOUNTER — Inpatient Hospital Stay (HOSPITAL_COMMUNITY)
Admission: AD | Admit: 2012-12-31 | Discharge: 2013-01-01 | DRG: 744 | Disposition: A | Discharge status: Home / Self Care | Payer: Self-pay | Source: Ambulatory Visit | Attending: Obstetrics & Gynecology | Admitting: Obstetrics & Gynecology

## 2012-12-31 DIAGNOSIS — N926 Irregular menstruation, unspecified: Secondary | ICD-10-CM | POA: Diagnosis present

## 2012-12-31 DIAGNOSIS — N920 Excessive and frequent menstruation with regular cycle: Secondary | ICD-10-CM

## 2012-12-31 DIAGNOSIS — N390 Urinary tract infection, site not specified: Secondary | ICD-10-CM | POA: Diagnosis present

## 2012-12-31 DIAGNOSIS — D500 Iron deficiency anemia secondary to blood loss (chronic): Secondary | ICD-10-CM | POA: Diagnosis present

## 2012-12-31 DIAGNOSIS — R9389 Abnormal findings on diagnostic imaging of other specified body structures: Secondary | ICD-10-CM | POA: Diagnosis present

## 2012-12-31 DIAGNOSIS — E039 Hypothyroidism, unspecified: Secondary | ICD-10-CM | POA: Diagnosis present

## 2012-12-31 DIAGNOSIS — N921 Excessive and frequent menstruation with irregular cycle: Secondary | ICD-10-CM

## 2012-12-31 LAB — CBC
HCT: 22.8 % — ABNORMAL LOW (ref 36.0–46.0)
Hemoglobin: 9 g/dL — ABNORMAL LOW (ref 12.0–15.0)
MCHC: 29.8 g/dL — ABNORMAL LOW (ref 30.0–36.0)
MCV: 74.5 fL — ABNORMAL LOW (ref 78.0–100.0)
MCV: 77 fL — ABNORMAL LOW (ref 78.0–100.0)
Platelets: 370 10*3/uL (ref 150–400)
Platelets: 302 10*3/uL (ref 150–400)
RBC: 3.06 MIL/uL — ABNORMAL LOW (ref 3.87–5.11)
RBC: 3.65 MIL/uL — ABNORMAL LOW (ref 3.87–5.11)
RDW: 19.9 % — ABNORMAL HIGH (ref 11.5–15.5)
WBC: 11.8 10*3/uL — ABNORMAL HIGH (ref 4.0–10.5)
WBC: 8.4 10*3/uL (ref 4.0–10.5)

## 2012-12-31 LAB — URINALYSIS, ROUTINE W REFLEX MICROSCOPIC
Bilirubin Urine: NEGATIVE
Glucose, UA: NEGATIVE mg/dL
Nitrite: POSITIVE — AB
Specific Gravity, Urine: >1.03 — ABNORMAL HIGH (ref 1.005–1.030)
Urobilinogen, UA: 0.2 mg/dL (ref 0.0–1.0)
pH: 7 (ref 5.0–8.0)

## 2012-12-31 LAB — URINE MICROSCOPIC-ADD ON

## 2012-12-31 LAB — TSH: TSH: 7.378 u[IU]/mL — ABNORMAL HIGH (ref 0.350–4.500)

## 2012-12-31 LAB — POCT PREGNANCY, URINE: Preg Test, Ur: NEGATIVE

## 2012-12-31 LAB — PREPARE RBC (CROSSMATCH)

## 2012-12-31 MED ORDER — KETOROLAC TROMETHAMINE 60 MG/2ML IM SOLN
60.0000 mg | Freq: Once | INTRAMUSCULAR | Status: AC
  Administered 2012-12-31: 60 mg via INTRAMUSCULAR
  Filled 2012-12-31: qty 2

## 2012-12-31 MED ORDER — IBUPROFEN 600 MG PO TABS
600.0000 mg | ORAL_TABLET | Freq: Four times a day (QID) | ORAL | Status: DC | PRN
  Administered 2012-12-31 (×3): 600 mg via ORAL
  Filled 2012-12-31 (×3): qty 1

## 2012-12-31 MED ORDER — PROMETHAZINE HCL 25 MG PO TABS
25.0000 mg | ORAL_TABLET | Freq: Four times a day (QID) | ORAL | Status: DC | PRN
  Administered 2013-01-01: 25 mg via ORAL
  Filled 2012-12-31: qty 1

## 2012-12-31 MED ORDER — ACETAMINOPHEN 325 MG PO TABS
650.0000 mg | ORAL_TABLET | Freq: Once | ORAL | Status: AC
  Administered 2012-12-31: 650 mg via ORAL
  Filled 2012-12-31: qty 2

## 2012-12-31 MED ORDER — DIPHENHYDRAMINE HCL 25 MG PO CAPS
25.0000 mg | ORAL_CAPSULE | Freq: Once | ORAL | Status: AC
  Administered 2012-12-31: 25 mg via ORAL
  Filled 2012-12-31: qty 1

## 2012-12-31 MED ORDER — PROMETHAZINE HCL 25 MG/ML IJ SOLN
12.5000 mg | Freq: Four times a day (QID) | INTRAMUSCULAR | Status: DC | PRN
  Administered 2013-01-01: 12.5 mg via INTRAVENOUS
  Filled 2012-12-31: qty 1

## 2012-12-31 MED ORDER — AMITRIPTYLINE HCL 25 MG PO TABS
25.0000 mg | ORAL_TABLET | Freq: Every day | ORAL | Status: DC
  Administered 2013-01-01: 25 mg via ORAL
  Filled 2012-12-31 (×2): qty 1

## 2012-12-31 MED ORDER — INFLUENZA VAC SPLIT QUAD 0.5 ML IM SUSP
0.5000 mL | INTRAMUSCULAR | Status: AC
  Administered 2012-12-31: 0.5 mL via INTRAMUSCULAR
  Filled 2012-12-31: qty 0.5

## 2012-12-31 MED ORDER — OXYCODONE-ACETAMINOPHEN 5-325 MG PO TABS
1.0000 | ORAL_TABLET | ORAL | Status: DC | PRN
  Administered 2012-12-31 (×2): 2 via ORAL
  Administered 2013-01-01: 1 via ORAL
  Filled 2012-12-31 (×2): qty 2
  Filled 2012-12-31: qty 1
  Filled 2012-12-31: qty 2

## 2012-12-31 MED ORDER — PNEUMOCOCCAL VAC POLYVALENT 25 MCG/0.5ML IJ INJ
0.5000 mL | INJECTION | INTRAMUSCULAR | Status: AC
  Administered 2012-12-31: 0.5 mL via INTRAMUSCULAR
  Filled 2012-12-31 (×2): qty 0.5

## 2012-12-31 MED ORDER — ESTROGENS CONJUGATED 25 MG IJ SOLR
25.0000 mg | Freq: Four times a day (QID) | INTRAMUSCULAR | Status: DC
  Administered 2012-12-31 – 2013-01-01 (×2): 25 mg via INTRAVENOUS
  Filled 2012-12-31 (×7): qty 25

## 2012-12-31 MED ORDER — SODIUM CHLORIDE 0.9 % IV SOLN: Freq: Once | INTRAVENOUS | Status: DC

## 2012-12-31 MED ORDER — SODIUM CHLORIDE 0.9 % IV SOLN
INTRAVENOUS | Status: DC
  Administered 2013-01-01: via INTRAVENOUS

## 2012-12-31 MED ORDER — CEFAZOLIN SODIUM 1-5 GM-% IV SOLN
1.0000 g | Freq: Three times a day (TID) | INTRAVENOUS | Status: DC
  Administered 2012-12-31 – 2013-01-01 (×3): 1 g via INTRAVENOUS
  Filled 2012-12-31 (×4): qty 50

## 2012-12-31 MED ORDER — MEDROXYPROGESTERONE ACETATE 10 MG PO TABS
10.0000 mg | ORAL_TABLET | Freq: Two times a day (BID) | ORAL | Status: DC
  Administered 2012-12-31 (×2): 10 mg via ORAL
  Filled 2012-12-31 (×3): qty 1

## 2012-12-31 NOTE — Progress Notes (Signed)
Lysteda unavailable, will start i.v. Premarin 25 mg iv q 6h.

## 2012-12-31 NOTE — Progress Notes (Signed)
Subjective: Patient reports continued passage of large clots with pain.  Toilet bowl has several large clots up to 5 cm, passed in last 2 hours. Still cramping.Marland Kitchen   Has received 3 units prbc currently.  Also , pt having uti sx of frequency.  Urinalysis    Component Value Date/Time   COLORURINE RED* 12/31/2012 0307   APPEARANCEUR CLOUDY* 12/31/2012 0307   LABSPEC >1.030* 12/31/2012 0307   PHURINE 7.0 12/31/2012 0307   GLUCOSEU NEGATIVE 12/31/2012 0307   HGBUR LARGE* 12/31/2012 0307   BILIRUBINUR NEGATIVE 12/31/2012 0307   KETONESUR NEGATIVE 12/31/2012 0307   PROTEINUR >300* 12/31/2012 0307   UROBILINOGEN 0.2 12/31/2012 0307   NITRITE POSITIVE* 12/31/2012 0307   LEUKOCYTESUR TRACE* 12/31/2012 0307       Objective: I have reviewed patient's vital signs, medications, labs, pathology and clots passed. CBC    Component Value Date/Time   WBC 8.4 12/31/2012 2205   RBC 3.65* 12/31/2012 2205   HGB 9.0* 12/31/2012 2205   HCT 28.1* 12/31/2012 2205   PLT 302 12/31/2012 2205   MCV 77.0* 12/31/2012 2205   MCH 24.7* 12/31/2012 2205   MCHC 32.0 12/31/2012 2205   RDW 18.2* 12/31/2012 2205   LYMPHSABS 3.1 12/15/2012 1703   MONOABS 0.8 12/15/2012 1703   EOSABS 0.3 12/15/2012 1703   BASOSABS 0.1 12/15/2012 1703     General: alert, cooperative, mild distress and uncomfortable with cramping Resp: unlabored breathing GI: soft, non-tender; bowel sounds normal; no masses,  no organomegaly Vaginal Bleeding: moderate and heavy   Assessment/Plan:  Persistent menorrhagia, still actively passing clots. UTI, warranting tx.  I feel pt should be kept overnight and given additional agents, will add lysteda, keep pt NPO in am, and consider proceeding with hysteroscopy, D&C, and Endometrial Ablation as previously planned per Dr Debroah Loop. I am concerned that if pt sent home with persistent bleeding at this level, further transfusion to be required. Will place  NPO p MN.    LOS: 0 days     Carolan Avedisian V 12/31/2012, 11:00 PM

## 2012-12-31 NOTE — Progress Notes (Signed)
Ur chart review completed.  

## 2012-12-31 NOTE — MAU Note (Signed)
Pt reports bleeding since 09/12. But today bleeding has increased. Pt has an appointment w/ Dr. Debroah Loop on Thursday.

## 2012-12-31 NOTE — MAU Provider Note (Signed)
Agree with note  James G Arnold, MD . 

## 2012-12-31 NOTE — Progress Notes (Signed)
Admission nutrition screen triggered for weight loss. Patients chart reviewed and assessed  for nutritional risk. Medical record indicates a 20 lb weight gain since 11/12/12. Patient is determined to be at low nutrition  risk.   Elisabeth Cara M.Odis Luster LDN Neonatal Nutrition Support Specialist Pager [phone]

## 2012-12-31 NOTE — MAU Note (Signed)
CRITICAL VALUE ALERT  Critical value received:  Hemoglobin 6.8  Date of notification:  10/29  Time of notification:  0414  Critical value read back:yes  Nurse who received alert:  Judeth Horn [facility]  Dorathy Kinsman CNM notified at [phone]

## 2012-12-31 NOTE — MAU Provider Note (Signed)
Chief Complaint: Vaginal Bleeding   First Provider Initiated Contact with Patient 12/31/12 0413     SUBJECTIVE HPI: Sheryl Mathis is a 42 y.o. [id number] female who presents with heavy vaginal bleeding with clots, low abdominal cramping and passing clots, dizziness, generalized weakness and generalized headaches. Started bleeding 11/14/2012. Seen in ED in MAU 3 times total for same. Seen in [facility] hospital outpatient clinic. Endometrial biopsy negative for malignancy. Has followup visit in clinic with Dr. Debroah Loop tomorrow to discuss definitive management, but had significant worsening of symptoms tonight and felt that she needed emergent care.  Was initially placed on Megace for symptoms. Experienced poor control of bleeding and side effects of depression and interference with sleep. Was placed on OCP taper 2 weeks ago also with little improvement bleeding and has struggled with significant headaches and nausea on the multiple pill days. Has continued on one pill per day and skipped sugar pills as instructed, but bleeding has worsened.   Hemoglobin ~13 when patient not experiencing menorrhagia. Has trended down from 9's-7's over the past 6 weeks.  Past Medical History  Diagnosis Date  . Anemia   . Menorrhagia   . Headache(784.0)   . Depression    OB History  Gravida Para Term Preterm AB SAB TAB Ectopic Multiple Living  2 2 2       2     # Outcome Date GA Lbr Len/2nd Weight Sex Delivery Anes PTL Lv  2 TRM 1995     CS        Comments: breech  1 TRM 1990     CS        Comments: breech     Past Surgical History  Procedure Laterality Date  . Cesarean section    . Tubal ligation     History   Social History  . Marital Status: Legally Separated    Spouse Name: N/A    Number of Children: N/A  . Years of Education: N/A   Occupational History  . Not on file.   Social History Main Topics  . Smoking status: Current Every Day Smoker -- 0.50 packs/day    Types: Cigarettes  . Smokeless  tobacco: Not on file  . Alcohol Use: No  . Drug Use: No  . Sexual Activity: Not Currently    Birth Control/ Protection: Surgical   Other Topics Concern  . Not on file   Social History Narrative  . No narrative on file   No current facility-administered medications on file prior to encounter.   Current Outpatient Prescriptions on File Prior to Encounter  Medication Sig Dispense Refill  . amitriptyline (ELAVIL) 25 MG tablet Take 1 tablet (25 mg total) by mouth at bedtime.  20 tablet  1  . butalbital-acetaminophen-caffeine (FIORICET) 50-325-40 MG per tablet Take 1-2 tablets by mouth every 6 (six) hours as needed for headache.  20 tablet  0  . Ferrous Sulfate (IRON) 28 MG TABS Take 1 tablet (28 mg total) by mouth 2 (two) times daily. 256 mg of ferrous gluconate  30 each  2  . ibuprofen (ADVIL,MOTRIN) 200 MG tablet Take 400 mg by mouth every 6 (six) hours as needed for pain.      . Multiple Vitamins-Minerals (ALIVE WOMENS ENERGY) TABS Take 1 tablet by mouth daily.      . norethindrone-ethinyl estradiol (OVCON-35, 28,) 0.4-35 MG-MCG tablet Take 1 tablet by mouth daily.  1 Package  3  . oxyCODONE-acetaminophen (PERCOCET/ROXICET) 5-325 MG per tablet Take 1 tablet by  mouth every 4 (four) hours as needed for pain.  20 tablet  0  . promethazine (PHENERGAN) 25 MG tablet Take 1 tablet (25 mg total) by mouth every 6 (six) hours as needed for nausea.  30 tablet  0   No Known Allergies  ROS: Pertinent positive items in HPI. In addition, the patient reports vulvar irritation that she attributes to long-term maxi pad use. Negative for fever, chills, syncope, neck stiffness, difficulties with speech or gait urinary complaints or GI complaints. Denies any other history of heavy bleeding.  OBJECTIVE Blood pressure 122/68, pulse 87, temperature 99.1 F (37.3 C), temperature source Oral, resp. rate 18, SpO2 100.00%.  Patient Vitals for the past 24 hrs:  BP Temp Temp src Pulse Resp SpO2  12/31/12 0359  120/66 mmHg - - 98 - -  12/31/12 0358 122/68 mmHg - - 87 - -  12/31/12 0357 127/59 mmHg - - 84 - -  12/31/12 0335 - - - - - 100 %  12/31/12 0326 120/46 mmHg 99.1 F (37.3 C) Oral 89 18 -    GENERAL: Well-developed, well-nourished female in no acute distress. Pale. HEENT: Normocephalic HEART: normal rate and rhythm. RESP: normal effort, clear to auscultation bilaterally. ABDOMEN: Soft, non-tender EXTREMITIES: Nontender, no edema NEURO: Alert and oriented SPECULUM EXAM: NEFG, moderate amount of bright red blood and 5 cm clot noted, cervix clean. No cervical polyps. BIMANUAL: cervix closed; uterus size difficult to assess due to body habitus. Nontender. No adnexal tenderness or masses. No cervical motion tenderness.  LAB RESULTS Results for orders placed during the hospital encounter of 12/31/12 (from the past 24 hour(s))  CBC     Status: Abnormal   Collection Time    12/31/12  3:50 AM      Result Value Range   WBC 11.8 (*) 4.0 - 10.5 K/uL   RBC 3.06 (*) 3.87 - 5.11 MIL/uL   Hemoglobin 6.8 (*) 12.0 - 15.0 g/dL   HCT 28.4 (*) 13.2 - 44.0 %   MCV 74.5 (*) 78.0 - 100.0 fL   MCH 22.2 (*) 26.0 - 34.0 pg   MCHC 29.8 (*) 30.0 - 36.0 g/dL   RDW 10.2 (*) 72.5 - 36.6 %   Platelets 370  150 - 400 K/uL  POCT PREGNANCY, URINE     Status: None   Collection Time    12/31/12  4:39 AM      Result Value Range   Preg Test, Ur NEGATIVE  NEGATIVE    IMAGING 11/14/2012: Ultrasound showed markedly thickened endometrium at 3 mm.  MAU COURSE Discussed emergent versus long-term management of menorrhagia and anemia. Patient stated multiple times that she is feeling worse and worse despite taking Megace, OCPs and iron supplements as directed. Discussed IV fluids and increasing iron supplementation versus transfusion for symptoms of anemia and risks and benefits of each including risk of infection or transfusion reaction from transfusion. Patient requesting transfusion.   Discussed Depo-Provera, Mirena  and ablation for long-term management. Patient very interested in ablation. Encourage patient to discuss this with Dr. Debroah Loop of her appointment tomorrow as this cannot be done as an emergent procedure.  Discussed patient's symptoms, history, hemoglobin and vital signs with Dr. Debroah Loop and informed him of her strong desire for transfusion. Will admit for observation on third floor and administer 3 units of packed red blood cells and start Provera by mouth.  Toradol given for cramping.  ASSESSMENT 1. Menorrhagia with irregular cycle   2. Anemia due to chronic blood loss  3. UTI (urinary tract infection), uncomplicated    PLAN Admit for observation on third floor for transfusion. Provera  Cipro Dr. Debroah Loop to follow. TSH pending.  Dorathy Kinsman, CNM 12/31/2012  4:00 AM

## 2012-12-31 NOTE — H&P (Signed)
Chief Complaint: Vaginal Bleeding   First Provider Initiated Contact with Patient 12/31/12 0413     SUBJECTIVE HPI: Sheryl Mathis is a 42 y.o. [id number] female who presents with heavy vaginal bleeding with clots, low abdominal cramping and passing clots, dizziness, generalized weakness and generalized headaches. Started bleeding 11/14/2012. Seen in ED in MAU 3 times total for same. Seen in [facility] hospital outpatient clinic. Endometrial biopsy negative for malignancy. Has followup visit in clinic with Dr. Debroah Loop tomorrow to discuss definitive management, but had significant worsening of symptoms tonight and felt that she needed emergent care.  Was initially placed on Megace for symptoms. Experienced poor control of bleeding and side effects of depression and interference with sleep. Was placed on OCP taper 2 weeks ago also with little improvement bleeding and has struggled with significant headaches and nausea on the multiple pill days. Has continued on one pill per day and skipped sugar pills as instructed, but bleeding has worsened.   Hemoglobin ~13 when patient not experiencing menorrhagia. Has trended down from 9's-7's over the past 6 weeks.  Past Medical History  Diagnosis Date  . Anemia   . Menorrhagia   . Headache(784.0)   . Depression    OB History  Gravida Para Term Preterm AB SAB TAB Ectopic Multiple Living  2 2 2       2     # Outcome Date GA Lbr Len/2nd Weight Sex Delivery Anes PTL Lv  2 TRM 1995     CS        Comments: breech  1 TRM 1990     CS        Comments: breech     Past Surgical History  Procedure Laterality Date  . Cesarean section    . Tubal ligation     History   Social History  . Marital Status: Legally Separated    Spouse Name: N/A    Number of Children: N/A  . Years of Education: N/A   Occupational History  . Not on file.   Social History Main Topics  . Smoking status: Current Every Day Smoker -- 0.50 packs/day    Types: Cigarettes  . Smokeless  tobacco: Not on file  . Alcohol Use: No  . Drug Use: No  . Sexual Activity: Not Currently    Birth Control/ Protection: Surgical   Other Topics Concern  . Not on file   Social History Narrative  . No narrative on file   No current facility-administered medications on file prior to encounter.   Current Outpatient Prescriptions on File Prior to Encounter  Medication Sig Dispense Refill  . amitriptyline (ELAVIL) 25 MG tablet Take 1 tablet (25 mg total) by mouth at bedtime.  20 tablet  1  . butalbital-acetaminophen-caffeine (FIORICET) 50-325-40 MG per tablet Take 1-2 tablets by mouth every 6 (six) hours as needed for headache.  20 tablet  0  . Ferrous Sulfate (IRON) 28 MG TABS Take 1 tablet (28 mg total) by mouth 2 (two) times daily. 256 mg of ferrous gluconate  30 each  2  . ibuprofen (ADVIL,MOTRIN) 200 MG tablet Take 400 mg by mouth every 6 (six) hours as needed for pain.      . Multiple Vitamins-Minerals (ALIVE WOMENS ENERGY) TABS Take 1 tablet by mouth daily.      . norethindrone-ethinyl estradiol (OVCON-35, 28,) 0.4-35 MG-MCG tablet Take 1 tablet by mouth daily.  1 Package  3  . oxyCODONE-acetaminophen (PERCOCET/ROXICET) 5-325 MG per tablet Take 1 tablet by  mouth every 4 (four) hours as needed for pain.  20 tablet  0  . promethazine (PHENERGAN) 25 MG tablet Take 1 tablet (25 mg total) by mouth every 6 (six) hours as needed for nausea.  30 tablet  0   No Known Allergies  ROS: Pertinent positive items in HPI. In addition, the patient reports vulvar irritation that she attributes to long-term maxi pad use. Negative for fever, chills, syncope, neck stiffness, difficulties with speech or gait urinary complaints or GI complaints. Denies any other history of heavy bleeding.  OBJECTIVE Blood pressure 122/68, pulse 87, temperature 99.1 F (37.3 C), temperature source Oral, resp. rate 18, SpO2 100.00%.  Patient Vitals for the past 24 hrs:  BP Temp Temp src Pulse Resp SpO2  12/31/12 0359  120/66 mmHg - - 98 - -  12/31/12 0358 122/68 mmHg - - 87 - -  12/31/12 0357 127/59 mmHg - - 84 - -  12/31/12 0335 - - - - - 100 %  12/31/12 0326 120/46 mmHg 99.1 F (37.3 C) Oral 89 18 -    GENERAL: Well-developed, well-nourished female in no acute distress. Pale. HEENT: Normocephalic HEART: normal rate and rhythm. RESP: normal effort, clear to auscultation bilaterally. ABDOMEN: Soft, non-tender EXTREMITIES: Nontender, no edema NEURO: Alert and oriented SPECULUM EXAM: NEFG, moderate amount of bright red blood and 5 cm clot noted, cervix clean. No cervical polyps. BIMANUAL: cervix closed; uterus size difficult to assess due to body habitus. Nontender. No adnexal tenderness or masses. No cervical motion tenderness.  LAB RESULTS Results for orders placed during the hospital encounter of 12/31/12 (from the past 24 hour(s))  CBC     Status: Abnormal   Collection Time    12/31/12  3:50 AM      Result Value Range   WBC 11.8 (*) 4.0 - 10.5 K/uL   RBC 3.06 (*) 3.87 - 5.11 MIL/uL   Hemoglobin 6.8 (*) 12.0 - 15.0 g/dL   HCT 86.5 (*) 78.4 - 69.6 %   MCV 74.5 (*) 78.0 - 100.0 fL   MCH 22.2 (*) 26.0 - 34.0 pg   MCHC 29.8 (*) 30.0 - 36.0 g/dL   RDW 29.5 (*) 28.4 - 13.2 %   Platelets 370  150 - 400 K/uL  POCT PREGNANCY, URINE     Status: None   Collection Time    12/31/12  4:39 AM      Result Value Range   Preg Test, Ur NEGATIVE  NEGATIVE    IMAGING 11/14/2012: Ultrasound showed markedly thickened endometrium at 3 mm.  MAU COURSE Discussed emergent versus long-term management of menorrhagia and anemia. Patient stated multiple times that she is feeling worse and worse despite taking Megace, OCPs and iron supplements as directed. Discussed IV fluids and increasing iron supplementation versus transfusion for symptoms of anemia and risks and benefits of each including risk of infection or transfusion reaction from transfusion. Patient requesting transfusion.   Discussed Depo-Provera, Mirena  and ablation for long-term management. Patient very interested in ablation. Encourage patient to discuss this with Dr. Debroah Loop of her appointment tomorrow as this cannot be done as an emergent procedure.  Discussed patient's symptoms, history, hemoglobin and vital signs with Dr. Debroah Loop and informed him of her strong desire for transfusion. Will admit for observation on third floor and administer 3 units of packed red blood cells and start Provera by mouth.  Toradol given for cramping.  ASSESSMENT 1. Menorrhagia with irregular cycle   2. Anemia due to chronic blood loss  3.  Uncomplicated UTI  PLAN Admit for observation on third floor for transfusion. Provera Cipro Dr. Debroah Loop to follow. TSH pending.  Dorathy Kinsman, CNM 12/31/2012  4:00 AM

## 2013-01-01 ENCOUNTER — Inpatient Hospital Stay (HOSPITAL_COMMUNITY): Payer: No Typology Code available for payment source | Admitting: Anesthesiology

## 2013-01-01 ENCOUNTER — Ambulatory Visit: Payer: Self-pay | Admitting: Obstetrics & Gynecology

## 2013-01-01 ENCOUNTER — Encounter (HOSPITAL_COMMUNITY)
Admission: AD | Disposition: A | Discharge status: Home / Self Care | Payer: Self-pay | Source: Ambulatory Visit | Attending: Obstetrics & Gynecology

## 2013-01-01 ENCOUNTER — Encounter (HOSPITAL_COMMUNITY): Payer: Self-pay | Admitting: Registered Nurse

## 2013-01-01 ENCOUNTER — Encounter (HOSPITAL_COMMUNITY): Payer: Self-pay | Admitting: Anesthesiology

## 2013-01-01 DIAGNOSIS — R9389 Abnormal findings on diagnostic imaging of other specified body structures: Secondary | ICD-10-CM

## 2013-01-01 DIAGNOSIS — N920 Excessive and frequent menstruation with regular cycle: Principal | ICD-10-CM

## 2013-01-01 HISTORY — PX: DILITATION & CURRETTAGE/HYSTROSCOPY WITH HYDROTHERMAL ABLATION: SHX5570

## 2013-01-01 LAB — CBC
HCT: 25.4 % — ABNORMAL LOW (ref 36.0–46.0)
Hemoglobin: 8.2 g/dL — ABNORMAL LOW (ref 12.0–15.0)
MCH: 25.8 pg — ABNORMAL LOW (ref 26.0–34.0)
MCHC: 33.4 g/dL (ref 30.0–36.0)
MCV: 76.7 fL — ABNORMAL LOW (ref 78.0–100.0)
MCV: 77.1 fL — ABNORMAL LOW (ref 78.0–100.0)
Platelets: 324 10*3/uL (ref 150–400)
RDW: 18.1 % — ABNORMAL HIGH (ref 11.5–15.5)
WBC: 10.7 10*3/uL — ABNORMAL HIGH (ref 4.0–10.5)

## 2013-01-01 LAB — BASIC METABOLIC PANEL
BUN: 12 mg/dL (ref 6–23)
Chloride: 105 mEq/L (ref 96–112)
GFR calc Af Amer: >90 mL/min (ref >90)
Glucose, Bld: 93 mg/dL (ref 70–99)
Potassium: 4.2 mEq/L (ref 3.5–5.1)
Sodium: 135 mEq/L (ref 135–145)

## 2013-01-01 SURGERY — DILATATION & CURETTAGE/HYSTEROSCOPY WITH HYDROTHERMAL ABLATION
Anesthesia: General | Site: Vagina | Wound class: Clean Contaminated

## 2013-01-01 MED ORDER — SODIUM CHLORIDE 0.9 % IR SOLN
Status: DC | PRN
  Administered 2013-01-01: 3000 mL

## 2013-01-01 MED ORDER — DEXAMETHASONE SODIUM PHOSPHATE 10 MG/ML IJ SOLN
INTRAMUSCULAR | Status: DC | PRN
  Administered 2013-01-01: 10 mg via INTRAVENOUS

## 2013-01-01 MED ORDER — IBUPROFEN 600 MG PO TABS: 600.0000 mg | ORAL_TABLET | Freq: Four times a day (QID) | ORAL | Status: DC | PRN

## 2013-01-01 MED ORDER — KETOROLAC TROMETHAMINE 30 MG/ML IJ SOLN
INTRAMUSCULAR | Status: DC | PRN
  Administered 2013-01-01: 30 mg via INTRAVENOUS

## 2013-01-01 MED ORDER — ONDANSETRON HCL 4 MG/2ML IJ SOLN
INTRAMUSCULAR | Status: DC | PRN
  Administered 2013-01-01: 4 mg via INTRAVENOUS

## 2013-01-01 MED ORDER — DEXAMETHASONE SODIUM PHOSPHATE 10 MG/ML IJ SOLN
INTRAMUSCULAR | Status: AC
  Filled 2013-01-01: qty 1

## 2013-01-01 MED ORDER — FENTANYL CITRATE 0.05 MG/ML IJ SOLN
25.0000 ug | INTRAMUSCULAR | Status: DC | PRN
  Administered 2013-01-01: 50 ug via INTRAVENOUS

## 2013-01-01 MED ORDER — LACTATED RINGERS IV SOLN
INTRAVENOUS | Status: DC
  Administered 2013-01-01: 125 mL/h via INTRAVENOUS
  Administered 2013-01-01 (×2): via INTRAVENOUS

## 2013-01-01 MED ORDER — ONDANSETRON HCL 4 MG/2ML IJ SOLN
INTRAMUSCULAR | Status: AC
  Filled 2013-01-01: qty 2

## 2013-01-01 MED ORDER — DOCUSATE SODIUM 100 MG PO CAPS: 100.0000 mg | ORAL_CAPSULE | Freq: Two times a day (BID) | ORAL | Status: DC | PRN

## 2013-01-01 MED ORDER — PROPOFOL 10 MG/ML IV EMUL
INTRAVENOUS | Status: AC
  Filled 2013-01-01: qty 20

## 2013-01-01 MED ORDER — FENTANYL CITRATE 0.05 MG/ML IJ SOLN
INTRAMUSCULAR | Status: DC | PRN
  Administered 2013-01-01 (×2): 50 ug via INTRAVENOUS

## 2013-01-01 MED ORDER — LIDOCAINE HCL (CARDIAC) 20 MG/ML IV SOLN
INTRAVENOUS | Status: DC | PRN
  Administered 2013-01-01: 80 mg via INTRAVENOUS

## 2013-01-01 MED ORDER — BUPIVACAINE HCL (PF) 0.5 % IJ SOLN
INTRAMUSCULAR | Status: AC
  Filled 2013-01-01: qty 30

## 2013-01-01 MED ORDER — MEPERIDINE HCL 25 MG/ML IJ SOLN: 6.2500 mg | INTRAMUSCULAR | Status: DC | PRN

## 2013-01-01 MED ORDER — MIDAZOLAM HCL 2 MG/2ML IJ SOLN
INTRAMUSCULAR | Status: AC
  Filled 2013-01-01: qty 2

## 2013-01-01 MED ORDER — BUPIVACAINE HCL 0.5 % IJ SOLN
INTRAMUSCULAR | Status: DC | PRN
  Administered 2013-01-01: 30 mL

## 2013-01-01 MED ORDER — FENTANYL CITRATE 0.05 MG/ML IJ SOLN
INTRAMUSCULAR | Status: AC
  Filled 2013-01-01: qty 2

## 2013-01-01 MED ORDER — DEXTROSE 5 % IV SOLN
INTRAVENOUS | Status: DC | PRN
  Administered 2013-01-01: 250 mL

## 2013-01-01 MED ORDER — PROPOFOL 10 MG/ML IV BOLUS
INTRAVENOUS | Status: DC | PRN
  Administered 2013-01-01: 200 mg via INTRAVENOUS

## 2013-01-01 MED ORDER — PROMETHAZINE HCL 25 MG/ML IJ SOLN: 6.2500 mg | INTRAMUSCULAR | Status: DC | PRN

## 2013-01-01 MED ORDER — KETOROLAC TROMETHAMINE 30 MG/ML IJ SOLN: 15.0000 mg | Freq: Once | INTRAMUSCULAR | Status: DC | PRN

## 2013-01-01 MED ORDER — OXYCODONE-ACETAMINOPHEN 5-325 MG PO TABS: 1.0000 | ORAL_TABLET | ORAL | Status: DC | PRN

## 2013-01-01 MED ORDER — KETOROLAC TROMETHAMINE 30 MG/ML IJ SOLN
INTRAMUSCULAR | Status: AC
  Filled 2013-01-01: qty 1

## 2013-01-01 MED ORDER — MIDAZOLAM HCL 2 MG/2ML IJ SOLN: 0.5000 mg | Freq: Once | INTRAMUSCULAR | Status: DC | PRN

## 2013-01-01 MED ORDER — LIDOCAINE HCL (CARDIAC) 20 MG/ML IV SOLN
INTRAVENOUS | Status: AC
  Filled 2013-01-01: qty 5

## 2013-01-01 MED ORDER — MIDAZOLAM HCL 5 MG/5ML IJ SOLN
INTRAMUSCULAR | Status: DC | PRN
  Administered 2013-01-01: 2 mg via INTRAVENOUS

## 2013-01-01 SURGICAL SUPPLY — 15 items
CATH ROBINSON RED A/P 16FR (CATHETERS) ×2 IMPLANT
CLOTH BEACON ORANGE TIMEOUT ST (SAFETY) ×2 IMPLANT
CONTAINER PREFILL 10% NBF 60ML (FORM) ×4 IMPLANT
DRAPE HYSTEROSCOPY (DRAPE) ×2 IMPLANT
DRESSING TELFA 8X3 (GAUZE/BANDAGES/DRESSINGS) ×2 IMPLANT
GLOVE BIO SURGEON STRL SZ7 (GLOVE) ×2 IMPLANT
GOWN STRL REIN XL XLG (GOWN DISPOSABLE) ×6 IMPLANT
NEEDLE SPNL 20GX3.5 QUINCKE YW (NEEDLE) ×2 IMPLANT
NEEDLE SPNL 22GX3.5 QUINCKE BK (NEEDLE) ×2 IMPLANT
PACK VAGINAL MINOR WOMEN LF (CUSTOM PROCEDURE TRAY) ×2 IMPLANT
PAD OB MATERNITY 4.3X12.25 (PERSONAL CARE ITEMS) ×2 IMPLANT
SET GENESYS HTA PROCERVA (MISCELLANEOUS) ×2 IMPLANT
SYR CONTROL 10ML LL (SYRINGE) ×2 IMPLANT
TOWEL OR 17X24 6PK STRL BLUE (TOWEL DISPOSABLE) ×4 IMPLANT
WATER STERILE IRR 1000ML POUR (IV SOLUTION) ×2 IMPLANT

## 2013-01-01 NOTE — Preoperative (Signed)
Beta Blockers   Reason not to administer Beta Blockers:Not Applicable 

## 2013-01-01 NOTE — Interval H&P Note (Signed)
History and Physical Interval Note 01/01/2013  1:08 PM  Sheryl Mathis  has presented today for surgery, with the diagnosis of abnormal uterine bleeding  The various methods of treatment have been discussed with the patient and family. After consideration of risks, benefits and other options for treatment, the patient has consented to: DILATATION & CURETTAGE/HYSTEROSCOPY WITH HYDROTHERMAL ABLATION  as a surgical intervention.  The patient's history has been reviewed, patient examined, no change in status, stable for surgery.  I have reviewed the patient's chart and labs.  Questions were answered to the patient's satisfaction.  To OR when ready.  Jaynie Collins, MD, FACOG Attending Obstetrician & Gynecologist Faculty Practice, [facility] [place]

## 2013-01-01 NOTE — Anesthesia Postprocedure Evaluation (Signed)
  Anesthesia Post Note  Patient: Sheryl Mathis  Procedure(s) Performed: Procedure(s) (LRB): DILATATION & CURETTAGE/HYSTEROSCOPY WITH ATTEMPTED HYDROTHERMAL ABLATION: Change over to THERMACHOICE ABLATION @ 1505 (N/A)  Anesthesia type: GA  Patient location: PACU  Post pain: Pain level controlled  Post assessment: Post-op Vital signs reviewed  Last Vitals:  Filed Vitals:   01/01/13 1645  BP:   Pulse:   Temp:   Resp: 24    Post vital signs: Reviewed  Level of consciousness: sedated  Complications: No apparent anesthesia complications

## 2013-01-01 NOTE — Op Note (Signed)
PREOPERATIVE DIAGNOSIS:  Abnormal uterine bleeding POSTOPERATIVE DIAGNOSIS: The same PROCEDURE: Dilation and Curettage, Hysteroscopy, Failed Hydrothermal andThermachoice Endometrial Ablation SURGEON:  Dr. Jaynie Collins  INDICATIONS: 42 y.o. [id number] here for scheduled surgery for abnormal uterine bleeding. Risks of surgery were discussed with the patient including but not limited to: bleeding; infection which may require antibiotics; injury to uterus leading to risk of injury to surrounding intraperitoneal organs and discontinuation of endometrial ablation, burn injury to vagina or other organs, need for additional procedures including laparoscopy or laparotomy, and other postoperative/anesthesia complications. Patient was informed that there is a high likelihood of success of controlling her symptoms; however about 5% of patients may require further intervention.  Written informed consent was obtained.    FINDINGS:  A 10 week size uterus.  Diffuse proliferative endometrium.  Normal ostia bilaterally.  No uterine perforation noted on hysteroscopy.    ANESTHESIA:   General, paracervical block with 30 ml of 0.5% Marcaine INTRAVENOUS FLUIDS:  600 ml of LR ESTIMATED BLOOD LOSS:  25 ml  SPECIMENS: Endometrial curettings sent to pathology COMPLICATIONS:  Likely occult uterine rupture not visualized on hysteroscopy; two endometrial ablation modalities could not proceed.  PROCEDURE DETAILS:  The patient was then taken to the operating room where general anesthesia was administered and was found to be adequate.  After an adequate timeout was performed, she was placed in the dorsal lithotomy position and examined; then prepped and draped in the sterile manner.   Her bladder was catheterized for clear, yellow urine. A speculum was then placed in the patient's vagina and a single tooth tenaculum was applied to the anterior lip of the cervix.   A paracervical block using 30 ml of 0.5% Marcaine was administered.   The cervix was sounded to 9 cm and dilated manually with metal dilators to accommodate the hydrothermal ablation hysteroscopic apparatus.  Once the cervix was dilated, a sharp curettage was then performed to obtain a moderate amount of endometrial curettings.  The hysteroscope was inserted under direct visualization using normal saline as a suspension medium.  The uterine cavity was carefully examined, both ostia were recognized, and diffusely proliferative endometrium was noted.   The hydrothermal ablation was attempted to be carried out but did not proceed due to possible uterine rupture.  Daignostic hysteroscopy was done and no rupture was visualized. However, the hydrothermal abaltion procedure still did not proceed even after placed tenaculum on the posterior lip of cervix and occluding the cervical canal.  The procedure was aborted and the decision was made to try another endometrial ablation ablation modality.  The Thermachoice apparatus was primed as per protocol, and the ballon was inserted into the endometrial cavity.  The balloon was inflated with D5W as instructed but the ablation did not proceed after inflation.  This raised concern for an occult uterine perforation, so endometrial ablation was aborted.  No other complications were observed.  The tenaculums were removed from the cervix, and the vaginal speculum was removed after noting good hemostasis.  The patient tolerated the procedure well and was taken to the recovery area awake, extubated and in stable condition.  Patient and her family will be informed of failure of endometrial ablation.  A repeat endometrial ablation procedure can be attempted in about 4 weeks, or patient will be given an option to proceed with definitive surgical management with hysterectomy.  Her uterus and cervix exhibited good descensus; she is a great candidate for vaginal hysterectomy if desired.    Jaynie Collins, MD, Evern Core  Attending Obstetrician &  Gynecologist Faculty Practice, [facility] [place]

## 2013-01-01 NOTE — H&P (View-Only) (Signed)
Subjective: Patient reports bleeding has improved since  [name]. Premarin started. C/o nausea, receiving [name] phenergan with premarin [name]. .    Objective: I have reviewed patient's vital signs, intake and output and labs. CBC    Component Value Date/Time   WBC 10.7* 01/01/2013 0530   RBC 3.31* 01/01/2013 0530   HGB 8.2* 01/01/2013 0530   HCT 25.4* 01/01/2013 0530   PLT 266 01/01/2013 0530   MCV 76.7* 01/01/2013 0530   MCH 24.8* 01/01/2013 0530   MCHC 32.3 01/01/2013 0530   RDW 18.2* 01/01/2013 0530   LYMPHSABS 3.1 12/15/2012 1703   MONOABS 0.8 12/15/2012 1703   EOSABS 0.3 12/15/2012 1703   BASOSABS 0.1 12/15/2012 1703    tsh elevated at 7.378  General: alert and no distress GI: soft, non-tender; bowel sounds normal; no masses,  no organomegaly Extremities: Homans sign is negative, no sign of DVT Vaginal Bleeding: minimal Pathology of Endometrial Biopsy 9/26:  No evidence of malignancy or hyperplasia, Fragments of endometrioid type tissue consistent with hormonal effect  Assessment/Plan: Menorrhagia with irregular cycle, Anemia due to menorrhagia, partially compensated s/p 3 units prbc UTI Hypothyroidism   Plan: Transfuse 4th unit prbc         NPO, will attempt to proceed with hysteroscopy d&C and endometrial ablation later today, Dr Macon Large aware of case. Patient informed that if bleeding controlled and OR schedule remains busy today, may need to reschedule D&C. Keep NPO for now.     LOS: 1 day    Sheryl Mathis 01/01/2013, 8:43 AM

## 2013-01-01 NOTE — Transfer of Care (Signed)
Immediate Anesthesia Transfer of Care Note  Patient: Sheryl Mathis  Procedure(s) Performed: Procedure(s): DILATATION & CURETTAGE/HYSTEROSCOPY WITH ATTEMPTED HYDROTHERMAL ABLATION: Change over to THERMACHOICE ABLATION @ 1505 (N/A)  Patient Location: PACU  Anesthesia Type:General  Level of Consciousness: awake, alert  and oriented  Airway & Oxygen Therapy: Patient Spontanous Breathing and Patient connected to nasal cannula oxygen  Post-op Assessment: Report given to PACU RN, Post -op Vital signs reviewed and stable and Patient moving all extremities  Post vital signs: Reviewed and stable  Complications: No apparent anesthesia complications

## 2013-01-01 NOTE — Discharge Summary (Signed)
Gynecology Physician Discharge Summary  Patient ID: Sheryl Mathis MRN: [id number] DOB/AGE: [birth year] 42 y.o.  Admit date: 12/31/2012 Discharge date: 01/01/2013  Preoperative Diagnoses: Menorrhagia; 3 cm thickened endometrium  Procedure(s) (LRB): DILATATION & CURETTAGE/HYSTEROSCOPY WITH FAILED ATTEMPTS OF HYDROTHERMAL ABLATION ANDTHERMACHOICE ABLATION  Consults: None  Significant Diagnostic Studies:   Recent Labs Lab 12/31/12 2205 01/01/13 0530 01/01/13 1808  WBC 8.4 10.7* 14.1*  HGB 9.0* 8.2* 10.6*  HCT 28.1* 25.4* 31.7*  PLT 302 266 324     Hospital Course:  TATIANNA Mathis is a 42 y.o. [id number]  admitted for bleeding on 12/31/12.  Initial Hgb was 6.8, she received a total of 4 units of pRBCs, discharge Hgb was 10.6.  She was treated with IV Premarin and scheduled for endometrial ablation.  She underwent the procedures as mentioned above, her operation was complicated by possible occult uterine perforation which prohibited endometrial ablation. For further details about surgery, please refer to the operative report. Patient had an uncomplicated postoperative course. By time of discharge, her pain was controlled on oral pain medications; she was ambulating, voiding without difficulty, tolerating regular diet and passing flatus. She was deemed stable for discharge to home.   Of note, patient desires hysterectomy, this will be scheduled soon and patient will be contacted with the scheduling information.  Discharged Condition: Stable  Disposition: 01-Home or Self Care     Medication List    STOP taking these medications       ALIVE WOMENS ENERGY Tabs      TAKE these medications       amitriptyline 25 MG tablet  Commonly known as:  ELAVIL  Take 1 tablet (25 mg total) by mouth at bedtime.     butalbital-acetaminophen-caffeine 50-325-40 MG per tablet  Commonly known as:  FIORICET  Take 1-2 tablets by mouth every 6 (six) hours as needed for headache.     docusate sodium 100  MG capsule  Commonly known as:  COLACE  Take 1 capsule (100 mg total) by mouth 2 (two) times daily as needed for constipation.     ibuprofen 600 MG tablet  Commonly known as:  ADVIL,MOTRIN  Take 1 tablet (600 mg total) by mouth every 6 (six) hours as needed.     Iron 28 MG Tabs  Take 1 tablet (28 mg total) by mouth 2 (two) times daily. 256 mg of ferrous gluconate     norethindrone-ethinyl estradiol 0.4-35 MG-MCG tablet  Commonly known as:  OVCON-35 (28)  Take 1 tablet by mouth daily.     oxyCODONE-acetaminophen 5-325 MG per tablet  Commonly known as:  PERCOCET/ROXICET  Take 1-2 tablets by mouth every 4 (four) hours as needed for pain.     promethazine 25 MG tablet  Commonly known as:  PHENERGAN  Take 1 tablet (25 mg total) by mouth every 6 (six) hours as needed for nausea.       Follow-up Information   Follow up with Tereso Newcomer, MD On 01/15/2013. (3pm at the [facility])    Specialty:  Obstetrics and Gynecology   Contact information:   [address] [place] [address] [phone]       Signed:  Jaynie Collins, MD, FACOG Attending Obstetrician & Gynecologist Faculty Practice, [facility] [place]

## 2013-01-01 NOTE — Transfer of Care (Deleted)
Immediate Anesthesia Transfer of Care Note  Patient: Sheryl Mathis  Procedure(s) Performed: Procedure(s): DILATATION & CURETTAGE/HYSTEROSCOPY WITH ATTEMPTED HYDROTHERMAL ABLATION: Change over to THERMACHOICE ABLATION @ 1505 (N/A)  Patient Location: PACU  Anesthesia Type:MAC  Level of Consciousness: sedated  Airway & Oxygen Therapy: Patient Spontanous Breathing and Patient connected to nasal cannula oxygen  Post-op Assessment: Report given to PACU RN and Post -op Vital signs reviewed and stable  Post vital signs: Reviewed and stable  Complications: No apparent anesthesia complications

## 2013-01-01 NOTE — Anesthesia Preprocedure Evaluation (Signed)
Anesthesia Evaluation  Patient identified by MRN, date of birth, ID band Patient awake    Reviewed: Allergy & Precautions, H&P , Patient's Chart, lab work & pertinent test results, reviewed documented beta blocker date and time   History of Anesthesia Complications Negative for: history of anesthetic complications  Airway Mallampati: III TM Distance: >3 FB Neck ROM: full    Dental no notable dental hx.    Pulmonary neg pulmonary ROS,  breath sounds clear to auscultation  Pulmonary exam normal       Cardiovascular Exercise Tolerance: Good negative cardio ROS  Rhythm:regular Rate:Normal     Neuro/Psych  Headaches, PSYCHIATRIC DISORDERS negative neurological ROS  negative psych ROS   GI/Hepatic negative GI ROS, Neg liver ROS,   Endo/Other  negative endocrine ROSMorbid obesity  Renal/GU negative Renal ROS     Musculoskeletal   Abdominal   Peds  Hematology negative hematology ROS (+) anemia ,   Anesthesia Other Findings   Reproductive/Obstetrics negative OB ROS                           Anesthesia Physical Anesthesia Plan  ASA: III  Anesthesia Plan: General LMA   Post-op Pain Management:    Induction:   Airway Management Planned:   Additional Equipment:   Intra-op Plan:   Post-operative Plan:   Informed Consent: I have reviewed the patients History and Physical, chart, labs and discussed the procedure including the risks, benefits and alternatives for the proposed anesthesia with the patient or authorized representative who has indicated his/her understanding and acceptance.   Dental Advisory Given  Plan Discussed with: CRNA, Surgeon and Anesthesiologist  Anesthesia Plan Comments:         Anesthesia Quick Evaluation

## 2013-01-01 NOTE — Progress Notes (Signed)
Subjective: Patient reports bleeding has improved since  Sheryl Mathis. Premarin started. C/o nausea, receiving Sheryl Mathis phenergan with premarin Sheryl Mathis. .    Objective: I have reviewed patient's vital signs, intake and output and labs. CBC    Component Value Date/Time   WBC 10.7* 01/01/2013 0530   RBC 3.31* 01/01/2013 0530   HGB 8.2* 01/01/2013 0530   HCT 25.4* 01/01/2013 0530   PLT 266 01/01/2013 0530   MCV 76.7* 01/01/2013 0530   MCH 24.8* 01/01/2013 0530   MCHC 32.3 01/01/2013 0530   RDW 18.2* 01/01/2013 0530   LYMPHSABS 3.1 12/15/2012 1703   MONOABS 0.8 12/15/2012 1703   EOSABS 0.3 12/15/2012 1703   BASOSABS 0.1 12/15/2012 1703    tsh elevated at 7.378  General: alert and no distress GI: soft, non-tender; bowel sounds normal; no masses,  no organomegaly Extremities: Homans sign is negative, no sign of DVT Vaginal Bleeding: minimal Pathology of Endometrial Biopsy 9/26:  No evidence of malignancy or hyperplasia, Fragments of endometrioid type tissue consistent with hormonal effect  Assessment/Plan: Menorrhagia with irregular cycle, Anemia due to menorrhagia, partially compensated s/p 3 units prbc UTI Hypothyroidism   Plan: Transfuse 4th unit prbc         NPO, will attempt to proceed with hysteroscopy d&C and endometrial ablation later today, Dr Anyanwu aware of case. Patient informed that if bleeding controlled and OR schedule remains busy today, may need to reschedule D&C. Keep NPO for now.     LOS: 1 day    Sheryl Mathis 01/01/2013, 8:43 AM     

## 2013-01-02 ENCOUNTER — Telehealth: Payer: Self-pay | Admitting: *Deleted

## 2013-01-02 ENCOUNTER — Encounter (HOSPITAL_COMMUNITY): Payer: Self-pay | Admitting: Obstetrics & Gynecology

## 2013-01-02 DIAGNOSIS — N390 Urinary tract infection, site not specified: Secondary | ICD-10-CM

## 2013-01-02 LAB — TYPE AND SCREEN
ABO/RH(D): A NEG
Antibody Screen: NEGATIVE
Unit division: 0
Unit division: 0
Unit division: 0

## 2013-01-02 NOTE — Telephone Encounter (Signed)
Patient called and stated that she was discharged from the hospital last night and Dr. Macon Large told her that she would send in an rx for uti but did not. Patient would like rx to be sent to her pharmacy. Will route note to Dr. Macon Large for order.

## 2013-01-05 ENCOUNTER — Telehealth: Payer: Self-pay

## 2013-01-05 MED ORDER — CIPROFLOXACIN HCL 500 MG PO TABS: 500.0000 mg | ORAL_TABLET | Freq: Two times a day (BID) | ORAL | Status: DC

## 2013-01-05 NOTE — Telephone Encounter (Signed)
Message copied by Faythe Casa on Mon Jan 05, 2013  4:46 PM ------      Message from: Jaynie Collins A      Created: Mon Jan 05, 2013  3:15 PM       Benign endometrial biopsy. Please call to inform patient of results. ------

## 2013-01-05 NOTE — Telephone Encounter (Signed)
Ciprofloxacin prescribed. Please call and tell patient to pick up prescription.

## 2013-01-05 NOTE — Telephone Encounter (Signed)
Called pt and left message that the procedure she had came back normal and if she has any questions she give the clinics a call.

## 2013-01-05 NOTE — Telephone Encounter (Signed)
Pt informed

## 2013-01-13 ENCOUNTER — Telehealth: Payer: Self-pay

## 2013-01-13 DIAGNOSIS — B373 Candidiasis of vulva and vagina: Secondary | ICD-10-CM

## 2013-01-13 DIAGNOSIS — B379 Candidiasis, unspecified: Secondary | ICD-10-CM

## 2013-01-13 MED ORDER — FLUCONAZOLE 150 MG PO TABS: 150.0000 mg | ORAL_TABLET | Freq: Every day | ORAL | Status: DC

## 2013-01-13 NOTE — Telephone Encounter (Signed)
Pt. Called nurse line on 01/13/13 at 1:13pm stating she had been taking cipro and has now broken out with a yeast infection. Describes symptoms of white, itchy discharge as well as white bumps in mouth and tongue and stated she knew it was thrush because she had her dentist (whom had come into her work) take a look at it. Pt. Was requesting medication for yeast infection.   Called pt. And stated I would order Diflucan 150mg  tab for her; pt. Requested medication be sent to [facility] pharmacy on [address] in [place]. Prescription sent. Pt. Verbalized understanding and gratitude and stated she had o other needs or questions.

## 2013-01-15 ENCOUNTER — Ambulatory Visit (INDEPENDENT_AMBULATORY_CARE_PROVIDER_SITE_OTHER): Payer: Self-pay | Admitting: Obstetrics & Gynecology

## 2013-01-15 ENCOUNTER — Encounter (HOSPITAL_COMMUNITY)
Admission: RE | Admit: 2013-01-15 | Discharge: 2013-01-15 | Disposition: A | Discharge status: Home / Self Care | Payer: No Typology Code available for payment source | Source: Ambulatory Visit | Attending: Obstetrics & Gynecology | Admitting: Obstetrics & Gynecology

## 2013-01-15 ENCOUNTER — Encounter: Payer: Self-pay | Admitting: Obstetrics & Gynecology

## 2013-01-15 ENCOUNTER — Encounter (HOSPITAL_COMMUNITY): Payer: Self-pay

## 2013-01-15 VITALS — BP 161/95 | Temp 97.3°F | Resp 65 | Ht 67.0 in | Wt 248.6 lb

## 2013-01-15 DIAGNOSIS — Z01818 Encounter for other preprocedural examination: Secondary | ICD-10-CM

## 2013-01-15 DIAGNOSIS — N920 Excessive and frequent menstruation with regular cycle: Secondary | ICD-10-CM

## 2013-01-15 DIAGNOSIS — R9389 Abnormal findings on diagnostic imaging of other specified body structures: Secondary | ICD-10-CM

## 2013-01-15 DIAGNOSIS — F419 Anxiety disorder, unspecified: Secondary | ICD-10-CM

## 2013-01-15 DIAGNOSIS — Z01812 Encounter for preprocedural laboratory examination: Secondary | ICD-10-CM

## 2013-01-15 DIAGNOSIS — F411 Generalized anxiety disorder: Secondary | ICD-10-CM

## 2013-01-15 LAB — CBC
HCT: 32.3 % — ABNORMAL LOW (ref 36.0–46.0)
MCHC: 31.9 g/dL (ref 30.0–36.0)
RDW: 17 % — ABNORMAL HIGH (ref 11.5–15.5)

## 2013-01-15 MED ORDER — LORAZEPAM 1 MG PO TABS: 1.0000 mg | ORAL_TABLET | Freq: Four times a day (QID) | ORAL | Status: DC | PRN

## 2013-01-15 MED ORDER — AMITRIPTYLINE HCL 25 MG PO TABS: 25.0000 mg | ORAL_TABLET | Freq: Every day | ORAL | Status: DC

## 2013-01-15 NOTE — Patient Instructions (Signed)
20 Sheryl Mathis  01/15/2013   Your procedure is scheduled on:  01/22/13  Enter through the Main Entrance of [facility] at 8 AM.  Pick up the phone at the desk and dial [phone].   Call this number if you have problems the morning of surgery: [phone]   Remember:   Do not eat food:After Midnight.  Do not drink clear liquids: After Midnight.  Take these medicines the morning of surgery with A SIP OF WATER: NA   Do not wear jewelry, make-up or nail polish.  Do not wear lotions, powders, or perfumes. You may wear deodorant.  Do not shave 48 hours prior to surgery.  Do not bring valuables to the hospital.  [facility] is not   responsible for any belongings or valuables brought to the hospital.  Contacts, dentures or bridgework may not be worn into surgery.  Leave suitcase in the car. After surgery it may be brought to your room.  For patients admitted to the hospital, checkout time is 11:00 AM the day of              discharge.   Patients discharged the day of surgery will not be allowed to drive             home.  Name and phone number of your driver: NA  Special Instructions:   Shower using CHG 2 nights before surgery and the night before surgery.  If you shower the day of surgery use CHG.  Use special wash - you have one bottle of CHG for all showers.  You should use approximately 1/3 of the bottle for each shower.   Please read over the following fact sheets that you were given:   Surgical Site Infection Prevention

## 2013-01-15 NOTE — Patient Instructions (Signed)
Hysterectomy Information  A hysterectomy is a procedure where your uterus is surgically removed. It will no longer be possible to have menstrual periods or to become pregnant. The tubes and ovaries can be removed (bilateral salpingo-oopherectomy) during this surgery as well.  REASONS FOR A HYSTERECTOMY  Persistent, abnormal bleeding.  Lasting (chronic) pelvic pain or infection.  The lining of the uterus (endometrium) starts growing outside the uterus (endometriosis).  The endometrium starts growing in the muscle of the uterus (adenomyosis).  The uterus falls down into the vagina (pelvic organ prolapse).  Symptomatic uterine fibroids.  Precancerous cells.  Cervical cancer or uterine cancer. TYPES OF HYSTERECTOMIES  Supracervical hysterectomy. This type removes the top part of the uterus, but not the cervix.  Total hysterectomy. This type removes the uterus and cervix.  Radical hysterectomy. This type removes the uterus, cervix, and the fibrous tissue that holds the uterus in place in the pelvis (parametrium). WAYS A HYSTERECTOMY CAN BE PERFORMED  Abdominal hysterectomy. A large surgical cut (incision) is made in the abdomen. The uterus is removed through this incision.  Vaginal hysterectomy. An incision is made in the vagina. The uterus is removed through this incision. There are no abdominal incisions.  Conventional laparoscopic hysterectomy. A thin, lighted tube with a camera (laparoscope) is inserted into 3 or 4 small incisions in the abdomen. The uterus is cut into small pieces. The small pieces are removed through the incisions, or they are removed through the vagina.  Laparoscopic assisted vaginal hysterectomy (LAVH). Three or four small incisions are made in the abdomen. Part of the surgery is performed laparoscopically and part vaginally. The uterus is removed through the vagina.  Robot-assisted laparoscopic hysterectomy. A laparoscope is inserted into 3 or 4 small  incisions in the abdomen. A computer-controlled device is used to give the surgeon a 3D image. This allows for more precise movements of surgical instruments. The uterus is cut into small pieces and removed through the incisions or removed through the vagina. RISKS OF HYSTERECTOMY   Bleeding and risk of blood transfusion. Tell your caregiver if you do not want to receive any blood products.  Blood clots in the legs or lung.  Infection.  Injury to surrounding organs.  Anesthesia problems or side effects.  Conversion to an abdominal hysterectomy. WHAT TO EXPECT AFTER A HYSTERECTOMY  You will be given pain medicine.  You will need to have someone with you for the first 3 to 5 days after you go home.  You will need to follow up with your surgeon in 2 to 4 weeks after surgery to evaluate your progress.  You may have early menopause symptoms like hot flashes, night sweats, and insomnia.  If you had a hysterectomy for a problem that was not a cancer or a condition that could lead to cancer, then you no longer need Pap tests. However, even if you no longer need a Pap test, a regular exam is a good idea to make sure no other problems are starting. Document Released: 08/15/2000 Document Revised: 05/14/2011 Document Reviewed: 09/30/2010 Exit[employer] Patient Information 2014 [employer].  

## 2013-01-16 NOTE — Progress Notes (Signed)
GYNECOLOGY CLINIC ENCOUNTER NOTE  History:  41 y.o. G2P2002 (LTCS x 2) here today for preoperative visit; she is scheduled for TVH on 01/22/13.  Patient still reports having abnormal bleeding.  Also feels depressed and desires refill of her Elavil; denies any HI or SI.  Of note, patient underwent a hysteroscopy, D&C and failed attempt of endometrial ablation on 01/01/13 which led to her deciding on proceeding with definitive surgical management.    The following portions of the patient's history were reviewed and updated as appropriate: allergies, current medications, past family history, past medical history, past social history, past surgical history and problem list. Normal pap smears as per patient.  Review of Systems:  Pertinent items are noted in HPI.  Objective:  Physical Exam BP 161/95  Temp(Src) 97.3 F (36.3 C)  Resp 65  Ht 5' 7" (1.702 m)  Wt 248 lb 9.6 oz (112.764 kg)  BMI 38.93 kg/m2  LMP 11/03/2012 Gen: NAD Abd: Soft, nontender and nondistended Pelvic: Deferred  01/01/13 Endometrium, curettage - FRAGMENTS SUGGESTIVE OF ENDOMETRIOID TYPE POLYP(S) WITH DECIDUALIZATION OF THE STROMA, CONSISTENT WITH HORMONE EFFECT. - BENIGN ENDOCERVICAL TYPE MUCOSA. - THERE IS NO EVIDENCE OF HYPERPLASIA OR MALIGNANCY.  Assessment & Plan:  Elavil refilled as per patient's request.   The risks of TVH were discussed in detail with the patient including but not limited to: bleeding which may require transfusion or reoperation; infection which may require antibiotics; injury to bowel, bladder, ureters or other surrounding organs; need for additional procedures including laparotomy; thromboembolic phenomenon, incisional problems and other postoperative/anesthesia complications.  Patient was also advised that she will remain in house for 1 night and expected recovery time after a hysterectomy is 6-8 weeks.  Likelihood of success in alleviating the patient's symptoms was discussed. Routine  postoperative instructions will be reviewed with the patient and her family in detail after surgery.  She was told that she should have nothing to eat or drink after midnight on the day prior to surgery, and also arrive to the hospital 1 1/2 hours prior to her time of surgery.  Patient has her preoperative visit in Short Stay today.  In the meantime, she will continue Megace as prescribed; bleeding precautions were reviewed. Printed patient education handouts about the procedure was given to the patient to review at home.   Kenneth Lax, MD, FACOG Attending Obstetrician & Gynecologist Faculty Practice, [facility] 

## 2013-01-19 ENCOUNTER — Encounter (HOSPITAL_COMMUNITY): Payer: Self-pay

## 2013-01-21 MED ORDER — CEFAZOLIN SODIUM-DEXTROSE 2-3 GM-% IV SOLR: 2.0000 g | INTRAVENOUS | Status: DC

## 2013-01-22 ENCOUNTER — Encounter (HOSPITAL_COMMUNITY)
Admission: RE | Disposition: A | Discharge status: Home / Self Care | Payer: Self-pay | Source: Ambulatory Visit | Attending: Obstetrics & Gynecology

## 2013-01-22 ENCOUNTER — Encounter (HOSPITAL_COMMUNITY): Payer: MEDICAID | Admitting: Anesthesiology

## 2013-01-22 ENCOUNTER — Ambulatory Visit: Payer: Self-pay | Admitting: Obstetrics & Gynecology

## 2013-01-22 ENCOUNTER — Encounter (HOSPITAL_COMMUNITY): Payer: Self-pay | Admitting: Anesthesiology

## 2013-01-22 ENCOUNTER — Observation Stay (HOSPITAL_COMMUNITY)
Admission: RE | Admit: 2013-01-22 | Discharge: 2013-01-23 | Disposition: A | Discharge status: Home / Self Care | Payer: MEDICAID | Source: Ambulatory Visit | Attending: Obstetrics & Gynecology | Admitting: Obstetrics & Gynecology

## 2013-01-22 ENCOUNTER — Other Ambulatory Visit (HOSPITAL_COMMUNITY): Payer: Self-pay

## 2013-01-22 ENCOUNTER — Ambulatory Visit (HOSPITAL_COMMUNITY): Payer: No Typology Code available for payment source | Admitting: Anesthesiology

## 2013-01-22 DIAGNOSIS — Z90710 Acquired absence of both cervix and uterus: Secondary | ICD-10-CM | POA: Diagnosis not present

## 2013-01-22 DIAGNOSIS — N920 Excessive and frequent menstruation with regular cycle: Secondary | ICD-10-CM

## 2013-01-22 DIAGNOSIS — N949 Unspecified condition associated with female genital organs and menstrual cycle: Secondary | ICD-10-CM | POA: Insufficient documentation

## 2013-01-22 DIAGNOSIS — N938 Other specified abnormal uterine and vaginal bleeding: Principal | ICD-10-CM | POA: Insufficient documentation

## 2013-01-22 HISTORY — PX: LAPAROSCOPIC ASSISTED VAGINAL HYSTERECTOMY: SHX5398

## 2013-01-22 LAB — TYPE AND SCREEN: Antibody Screen: NEGATIVE

## 2013-01-22 SURGERY — HYSTERECTOMY, VAGINAL, LAPAROSCOPY-ASSISTED
Anesthesia: General | Site: Abdomen | Wound class: Clean Contaminated

## 2013-01-22 MED ORDER — MIDAZOLAM HCL 2 MG/2ML IJ SOLN
INTRAMUSCULAR | Status: AC
  Filled 2013-01-22: qty 2

## 2013-01-22 MED ORDER — FENTANYL CITRATE 0.05 MG/ML IJ SOLN
INTRAMUSCULAR | Status: DC | PRN
  Administered 2013-01-22: 100 ug via INTRAVENOUS

## 2013-01-22 MED ORDER — OXYCODONE-ACETAMINOPHEN 5-325 MG PO TABS
1.0000 | ORAL_TABLET | ORAL | Status: DC | PRN
  Administered 2013-01-22 – 2013-01-23 (×4): 2 via ORAL
  Filled 2013-01-22 (×4): qty 2

## 2013-01-22 MED ORDER — CEFAZOLIN SODIUM-DEXTROSE 2-3 GM-% IV SOLR
INTRAVENOUS | Status: AC
  Filled 2013-01-22: qty 50

## 2013-01-22 MED ORDER — ATROPINE SULFATE 0.4 MG/ML IJ SOLN
INTRAMUSCULAR | Status: AC
  Filled 2013-01-22: qty 1

## 2013-01-22 MED ORDER — NEOSTIGMINE METHYLSULFATE 1 MG/ML IJ SOLN
INTRAMUSCULAR | Status: DC | PRN
  Administered 2013-01-22: 3 mg via INTRAVENOUS

## 2013-01-22 MED ORDER — HYDROMORPHONE HCL PF 1 MG/ML IJ SOLN
INTRAMUSCULAR | Status: AC
  Filled 2013-01-22: qty 1

## 2013-01-22 MED ORDER — DOCUSATE SODIUM 100 MG PO CAPS
100.0000 mg | ORAL_CAPSULE | Freq: Two times a day (BID) | ORAL | Status: DC | PRN
  Administered 2013-01-23: 100 mg via ORAL
  Filled 2013-01-22: qty 1

## 2013-01-22 MED ORDER — LIDOCAINE HCL (CARDIAC) 20 MG/ML IV SOLN
INTRAVENOUS | Status: AC
  Filled 2013-01-22: qty 5

## 2013-01-22 MED ORDER — ESTRADIOL 0.1 MG/GM VA CREA
TOPICAL_CREAM | VAGINAL | Status: AC
  Filled 2013-01-22: qty 42.5

## 2013-01-22 MED ORDER — DEXAMETHASONE SODIUM PHOSPHATE 10 MG/ML IJ SOLN
INTRAMUSCULAR | Status: AC
  Filled 2013-01-22: qty 1

## 2013-01-22 MED ORDER — LACTATED RINGERS IV SOLN
INTRAVENOUS | Status: DC
  Administered 2013-01-22 (×3): via INTRAVENOUS

## 2013-01-22 MED ORDER — FENTANYL CITRATE 0.05 MG/ML IJ SOLN
INTRAMUSCULAR | Status: DC | PRN
  Administered 2013-01-22 (×2): 50 ug via INTRAVENOUS
  Administered 2013-01-22: 100 ug via INTRAVENOUS

## 2013-01-22 MED ORDER — AMITRIPTYLINE HCL 25 MG PO TABS
25.0000 mg | ORAL_TABLET | Freq: Every day | ORAL | Status: DC
  Administered 2013-01-22: 25 mg via ORAL
  Filled 2013-01-22 (×2): qty 1

## 2013-01-22 MED ORDER — GLYCOPYRROLATE 0.2 MG/ML IJ SOLN
INTRAMUSCULAR | Status: AC
  Filled 2013-01-22: qty 2

## 2013-01-22 MED ORDER — SIMETHICONE 80 MG PO CHEW
80.0000 mg | CHEWABLE_TABLET | Freq: Four times a day (QID) | ORAL | Status: DC | PRN
  Filled 2013-01-22: qty 1

## 2013-01-22 MED ORDER — ONDANSETRON HCL 4 MG/2ML IJ SOLN
INTRAMUSCULAR | Status: AC
  Filled 2013-01-22: qty 2

## 2013-01-22 MED ORDER — ONDANSETRON HCL 4 MG/2ML IJ SOLN
INTRAMUSCULAR | Status: DC | PRN
  Administered 2013-01-22: 4 mg via INTRAVENOUS

## 2013-01-22 MED ORDER — ROCURONIUM BROMIDE 100 MG/10ML IV SOLN
INTRAVENOUS | Status: DC | PRN
  Administered 2013-01-22 (×9): 10 mg via INTRAVENOUS
  Administered 2013-01-22: 50 mg via INTRAVENOUS

## 2013-01-22 MED ORDER — KETOROLAC TROMETHAMINE 30 MG/ML IJ SOLN
INTRAMUSCULAR | Status: DC | PRN
  Administered 2013-01-22: 30 mg via INTRAVENOUS

## 2013-01-22 MED ORDER — ONDANSETRON HCL 4 MG/2ML IJ SOLN
4.0000 mg | Freq: Once | INTRAMUSCULAR | Status: AC
  Administered 2013-01-22: 4 mg via INTRAVENOUS

## 2013-01-22 MED ORDER — BUPIVACAINE HCL (PF) 0.5 % IJ SOLN
INTRAMUSCULAR | Status: DC | PRN
  Administered 2013-01-22: 10 mL

## 2013-01-22 MED ORDER — ONDANSETRON HCL 4 MG/2ML IJ SOLN
4.0000 mg | Freq: Four times a day (QID) | INTRAMUSCULAR | Status: DC | PRN
  Administered 2013-01-22 – 2013-01-23 (×2): 4 mg via INTRAVENOUS
  Filled 2013-01-22 (×2): qty 2

## 2013-01-22 MED ORDER — KETOROLAC TROMETHAMINE 30 MG/ML IJ SOLN: 30.0000 mg | Freq: Once | INTRAMUSCULAR | Status: DC

## 2013-01-22 MED ORDER — ROCURONIUM BROMIDE 100 MG/10ML IV SOLN
INTRAVENOUS | Status: AC
  Filled 2013-01-22: qty 1

## 2013-01-22 MED ORDER — BUPIVACAINE-EPINEPHRINE 0.5% -1:200000 IJ SOLN
INTRAMUSCULAR | Status: DC | PRN
  Administered 2013-01-22: 20 mL

## 2013-01-22 MED ORDER — PROPOFOL 10 MG/ML IV EMUL
INTRAVENOUS | Status: AC
  Filled 2013-01-22: qty 20

## 2013-01-22 MED ORDER — BUPIVACAINE-EPINEPHRINE (PF) 0.5% -1:200000 IJ SOLN
INTRAMUSCULAR | Status: AC
  Filled 2013-01-22: qty 10

## 2013-01-22 MED ORDER — DOCUSATE SODIUM 100 MG PO CAPS
100.0000 mg | ORAL_CAPSULE | Freq: Two times a day (BID) | ORAL | Status: DC
  Filled 2013-01-22 (×5): qty 1

## 2013-01-22 MED ORDER — LIDOCAINE HCL (CARDIAC) 20 MG/ML IV SOLN
INTRAVENOUS | Status: DC | PRN
  Administered 2013-01-22: 80 mg via INTRAVENOUS

## 2013-01-22 MED ORDER — NEOSTIGMINE METHYLSULFATE 1 MG/ML IJ SOLN
INTRAMUSCULAR | Status: AC
  Filled 2013-01-22: qty 1

## 2013-01-22 MED ORDER — ONDANSETRON HCL 4 MG PO TABS: 4.0000 mg | ORAL_TABLET | Freq: Four times a day (QID) | ORAL | Status: DC | PRN

## 2013-01-22 MED ORDER — MENTHOL 3 MG MT LOZG
1.0000 | LOZENGE | OROMUCOSAL | Status: DC | PRN
  Administered 2013-01-23: 3 mg via ORAL
  Filled 2013-01-22 (×2): qty 9

## 2013-01-22 MED ORDER — ATROPINE SULFATE 0.4 MG/ML IJ SOLN
INTRAMUSCULAR | Status: DC | PRN
  Administered 2013-01-22: 0.4 mg via INTRAVENOUS

## 2013-01-22 MED ORDER — HYDROMORPHONE HCL PF 1 MG/ML IJ SOLN
INTRAMUSCULAR | Status: DC | PRN
  Administered 2013-01-22 (×2): 1 mg via INTRAVENOUS

## 2013-01-22 MED ORDER — LACTATED RINGERS IV SOLN: INTRAVENOUS | Status: DC

## 2013-01-22 MED ORDER — LACTATED RINGERS IR SOLN
Status: DC | PRN
  Administered 2013-01-22 (×2): 3000 mL

## 2013-01-22 MED ORDER — IBUPROFEN 600 MG PO TABS
600.0000 mg | ORAL_TABLET | Freq: Four times a day (QID) | ORAL | Status: DC | PRN
  Administered 2013-01-23 (×2): 600 mg via ORAL
  Filled 2013-01-22 (×2): qty 1

## 2013-01-22 MED ORDER — PANTOPRAZOLE SODIUM 40 MG PO TBEC
40.0000 mg | DELAYED_RELEASE_TABLET | Freq: Every day | ORAL | Status: DC
  Filled 2013-01-22 (×2): qty 1

## 2013-01-22 MED ORDER — KETOROLAC TROMETHAMINE 30 MG/ML IJ SOLN
INTRAMUSCULAR | Status: AC
  Filled 2013-01-22: qty 1

## 2013-01-22 MED ORDER — CEFAZOLIN SODIUM-DEXTROSE 2-3 GM-% IV SOLR
2.0000 g | INTRAVENOUS | Status: AC
  Administered 2013-01-22: 2 g via INTRAVENOUS

## 2013-01-22 MED ORDER — GLYCOPYRROLATE 0.2 MG/ML IJ SOLN
INTRAMUSCULAR | Status: DC | PRN
  Administered 2013-01-22: .4 mg via INTRAVENOUS

## 2013-01-22 MED ORDER — HYDROMORPHONE HCL PF 1 MG/ML IJ SOLN
INTRAMUSCULAR | Status: AC
  Administered 2013-01-22: 0.5 mg via INTRAVENOUS
  Filled 2013-01-22: qty 1

## 2013-01-22 MED ORDER — FENTANYL CITRATE 0.05 MG/ML IJ SOLN
INTRAMUSCULAR | Status: AC
  Filled 2013-01-22: qty 5

## 2013-01-22 MED ORDER — FENTANYL CITRATE 0.05 MG/ML IJ SOLN
INTRAMUSCULAR | Status: AC
  Filled 2013-01-22: qty 2

## 2013-01-22 MED ORDER — BUPIVACAINE HCL (PF) 0.5 % IJ SOLN
INTRAMUSCULAR | Status: AC
  Filled 2013-01-22: qty 30

## 2013-01-22 MED ORDER — DEXAMETHASONE SODIUM PHOSPHATE 10 MG/ML IJ SOLN
INTRAMUSCULAR | Status: DC | PRN
  Administered 2013-01-22: 5 mg via INTRAVENOUS

## 2013-01-22 MED ORDER — HYDROMORPHONE HCL PF 1 MG/ML IJ SOLN
0.2000 mg | INTRAMUSCULAR | Status: DC | PRN
  Administered 2013-01-22: 0.6 mg via INTRAVENOUS
  Filled 2013-01-22: qty 1

## 2013-01-22 MED ORDER — PROPOFOL 10 MG/ML IV BOLUS
INTRAVENOUS | Status: DC | PRN
  Administered 2013-01-22: 200 mg via INTRAVENOUS

## 2013-01-22 MED ORDER — LORAZEPAM 1 MG PO TABS: 1.0000 mg | ORAL_TABLET | Freq: Four times a day (QID) | ORAL | Status: DC | PRN

## 2013-01-22 MED ORDER — SODIUM CHLORIDE 0.9 % IJ SOLN
INTRAMUSCULAR | Status: AC
  Filled 2013-01-22: qty 50

## 2013-01-22 MED ORDER — LACTATED RINGERS IV SOLN
INTRAVENOUS | Status: DC
  Administered 2013-01-22 – 2013-01-23 (×2): via INTRAVENOUS

## 2013-01-22 MED ORDER — HYDROMORPHONE HCL PF 1 MG/ML IJ SOLN
0.2500 mg | INTRAMUSCULAR | Status: DC | PRN
  Administered 2013-01-22 (×6): 0.5 mg via INTRAVENOUS

## 2013-01-22 MED ORDER — ZOLPIDEM TARTRATE 5 MG PO TABS: 5.0000 mg | ORAL_TABLET | Freq: Every evening | ORAL | Status: DC | PRN

## 2013-01-22 SURGICAL SUPPLY — 43 items
APPLICATOR COTTON TIP 6IN STRL (MISCELLANEOUS) ×2 IMPLANT
CABLE HIGH FREQUENCY MONO STRZ (ELECTRODE) IMPLANT
CANISTER SUCT 3000ML (MISCELLANEOUS) ×2 IMPLANT
CHLORAPREP W/TINT 26ML (MISCELLANEOUS) ×2 IMPLANT
CLOTH BEACON ORANGE TIMEOUT ST (SAFETY) ×2 IMPLANT
CONT PATH 16OZ SNAP LID 3702 (MISCELLANEOUS) ×2 IMPLANT
COUNTER NEEDLE 1200 MAGNETIC (NEEDLE) IMPLANT
COVER TABLE BACK 60X90 (DRAPES) ×2 IMPLANT
DECANTER SPIKE VIAL GLASS SM (MISCELLANEOUS) ×4 IMPLANT
DERMABOND ADVANCED (GAUZE/BANDAGES/DRESSINGS) ×1
DERMABOND ADVANCED .7 DNX12 (GAUZE/BANDAGES/DRESSINGS) ×1 IMPLANT
ELECT REM PT RETURN 9FT ADLT (ELECTROSURGICAL)
ELECTRODE REM PT RTRN 9FT ADLT (ELECTROSURGICAL) IMPLANT
EVACUATOR SMOKE 8.L (FILTER) ×2 IMPLANT
GLOVE BIO SURGEON STRL SZ7 (GLOVE) ×2 IMPLANT
GLOVE BIOGEL PI IND STRL 6.5 (GLOVE) ×1 IMPLANT
GLOVE BIOGEL PI IND STRL 7.0 (GLOVE) ×1 IMPLANT
GLOVE BIOGEL PI INDICATOR 6.5 (GLOVE) ×1
GLOVE BIOGEL PI INDICATOR 7.0 (GLOVE) ×1
GOWN STRL REIN XL XLG (GOWN DISPOSABLE) ×8 IMPLANT
NEEDLE HYPO 22GX1.5 SAFETY (NEEDLE) IMPLANT
NEEDLE INSUFFLATION 120MM (ENDOMECHANICALS) ×2 IMPLANT
NEEDLE MAYO .5 CIRCLE (NEEDLE) ×2 IMPLANT
NS IRRIG 1000ML POUR BTL (IV SOLUTION) ×2 IMPLANT
PACK LAVH (CUSTOM PROCEDURE TRAY) ×2 IMPLANT
PROTECTOR NERVE ULNAR (MISCELLANEOUS) ×2 IMPLANT
SCALPEL HARMONIC ACE (MISCELLANEOUS) IMPLANT
SEALER TISSUE G2 CVD JAW 35 (ENDOMECHANICALS) IMPLANT
SEALER TISSUE G2 CVD JAW 45CM (ENDOMECHANICALS) IMPLANT
SET IRRIG TUBING LAPAROSCOPIC (IRRIGATION / IRRIGATOR) IMPLANT
SUT VIC AB 0 CT1 18XCR BRD8 (SUTURE) ×3 IMPLANT
SUT VIC AB 0 CT1 36 (SUTURE) ×2 IMPLANT
SUT VIC AB 0 CT1 8-18 (SUTURE) ×3
SUT VICRYL 0 ENDOLOOP (SUTURE) IMPLANT
SUT VICRYL 0 TIES 12 18 (SUTURE) ×2 IMPLANT
SUT VICRYL 0 UR6 27IN ABS (SUTURE) ×4 IMPLANT
SUT VICRYL 4-0 PS2 18IN ABS (SUTURE) ×4 IMPLANT
TOWEL OR 17X24 6PK STRL BLUE (TOWEL DISPOSABLE) ×6 IMPLANT
TRAY FOLEY CATH 14FR (SET/KITS/TRAYS/PACK) ×2 IMPLANT
TROCAR XCEL NON-BLD 11X100MML (ENDOMECHANICALS) IMPLANT
TROCAR XCEL NON-BLD 5MMX100MML (ENDOMECHANICALS) ×4 IMPLANT
WARMER LAPAROSCOPE (MISCELLANEOUS) ×2 IMPLANT
WATER STERILE IRR 1000ML POUR (IV SOLUTION) ×2 IMPLANT

## 2013-01-22 NOTE — Transfer of Care (Signed)
Immediate Anesthesia Transfer of Care Note  Patient: Sheryl Mathis  Procedure(s) Performed: Procedure(s): LAPAROSCOPIC ASSISTED VAGINAL HYSTERECTOMY with Bilateral Fallopian Tubes and Ovaries (N/A)  Patient Location: PACU  Anesthesia Type:General  Level of Consciousness: awake, alert  and oriented  Airway & Oxygen Therapy: Patient Spontanous Breathing and Patient connected to nasal cannula oxygen  Post-op Assessment: Report given to PACU RN and Post -op Vital signs reviewed and stable  Post vital signs: stable  Complications: No apparent anesthesia complications

## 2013-01-22 NOTE — Anesthesia Preprocedure Evaluation (Signed)
Anesthesia Evaluation  Patient identified by MRN, date of birth, ID band Patient awake    Reviewed: Allergy & Precautions, H&P , Patient's Chart, lab work & pertinent test results, reviewed documented beta blocker date and time   Airway Mallampati: II TM Distance: >3 FB Neck ROM: full    Dental no notable dental hx.    Pulmonary Current Smoker,  breath sounds clear to auscultation  Pulmonary exam normal       Cardiovascular Rhythm:regular Rate:Normal     Neuro/Psych    GI/Hepatic   Endo/Other  Morbid obesity  Renal/GU      Musculoskeletal   Abdominal   Peds  Hematology   Anesthesia Other Findings   Reproductive/Obstetrics                           Anesthesia Physical Anesthesia Plan  ASA: III  Anesthesia Plan: General   Post-op Pain Management:    Induction: Intravenous  Airway Management Planned: Oral ETT  Additional Equipment:   Intra-op Plan:   Post-operative Plan: Extubation in OR  Informed Consent: I have reviewed the patients History and Physical, chart, labs and discussed the procedure including the risks, benefits and alternatives for the proposed anesthesia with the patient or authorized representative who has indicated his/her understanding and acceptance.   Dental Advisory Given and Dental advisory given  Plan Discussed with: CRNA and Surgeon  Anesthesia Plan Comments: (  Discussed general anesthesia, including possible nausea, instrumentation of airway, sore throat,pulmonary aspiration, etc. I asked if the were any outstanding questions, or  concerns before we proceeded. )        Anesthesia Quick Evaluation  

## 2013-01-22 NOTE — Interval H&P Note (Signed)
History and Physical Interval Note  Sheryl Mathis  has presented today for surgery, with the diagnosis of menorrhagia  The various methods of treatment have been discussed with the patient and family. After consideration of risks, benefits and other options for treatment, the patient has consented to LAPAROSCOPIC ASSISTED VAGINAL HYSTERECTOMY as a surgical intervention.   Laparoscopy assistance is part of the surgery given her history of two previous cesarean sections; this is needed for evaluation of possible adhesions from prior surgeries.  The patient's history has been reviewed, patient examined, no change in status, stable for surgery.  I have reviewed the patient's chart and labs.  Questions were answered to the patient's satisfaction.  To OR when ready.   Tereso Newcomer, MD 01/22/2013 9:43 AM

## 2013-01-22 NOTE — H&P (View-Only) (Signed)
GYNECOLOGY CLINIC ENCOUNTER NOTE  History:  42 y.o. G2P2002 (LTCS x 2) here today for preoperative visit; she is scheduled for [facility] on 01/22/13.  Patient still reports having abnormal bleeding.  Also feels depressed and desires refill of her Elavil; denies any HI or SI.  Of note, patient underwent a hysteroscopy, D&C and failed attempt of endometrial ablation on 01/01/13 which led to her deciding on proceeding with definitive surgical management.    The following portions of the patient's history were reviewed and updated as appropriate: allergies, current medications, past family history, past medical history, past social history, past surgical history and problem list. Normal pap smears as per patient.  Review of Systems:  Pertinent items are noted in HPI.  Objective:  Physical Exam BP 161/95  Temp(Src) 97.3 F (36.3 C)  Resp 65  Ht 5\' 7"  (1.702 m)  Wt 248 lb 9.6 oz (112.764 kg)  BMI 38.93 kg/m2  LMP 11/03/2012 Gen: NAD Abd: Soft, nontender and nondistended Pelvic: Deferred  01/01/13 Endometrium, curettage - FRAGMENTS SUGGESTIVE OF ENDOMETRIOID TYPE POLYP(S) WITH DECIDUALIZATION OF THE STROMA, CONSISTENT WITH HORMONE EFFECT. - BENIGN ENDOCERVICAL TYPE MUCOSA. - THERE IS NO EVIDENCE OF HYPERPLASIA OR MALIGNANCY.  Assessment & Plan:  Elavil refilled as per patient's request.   The risks of TVH were discussed in detail with the patient including but not limited to: bleeding which may require transfusion or reoperation; infection which may require antibiotics; injury to bowel, bladder, ureters or other surrounding organs; need for additional procedures including laparotomy; thromboembolic phenomenon, incisional problems and other postoperative/anesthesia complications.  Patient was also advised that she will remain in house for 1 night and expected recovery time after a hysterectomy is 6-8 weeks.  Likelihood of success in alleviating the patient's symptoms was discussed. Routine  postoperative instructions will be reviewed with the patient and her family in detail after surgery.  She was told that she should have nothing to eat or drink after midnight on the day prior to surgery, and also arrive to the hospital 1 1/2 hours prior to her time of surgery.  Patient has her preoperative visit in Short Stay today.  In the meantime, she will continue Megace as prescribed; bleeding precautions were reviewed. Printed patient education handouts about the procedure was given to the patient to review at home.   Jaynie Collins, MD, FACOG Attending Obstetrician & Gynecologist Faculty Practice, [facility] [place]

## 2013-01-22 NOTE — Op Note (Signed)
Sheryl Mathis PROCEDURE DATE: 01/22/2013   PREOPERATIVE DIAGNOSIS: Abnormal uterine bleeding POSTOPERATIVE DIAGNOSIS: The same PROCEDURE: Laparoscopic assisted vaginal hysterectomy, bilateral salpingectomy, lysis of adhesions SURGEON:  Dr. Jaynie Collins ASSISTANT: Dr. Willodean Rosenthal    INDICATIONS: 42 y.o. [id number] with aforementioned preoprative diagnosis here today for definitive surgical management.   Risks of surgery were discussed with the patient including but not limited to: bleeding which may require transfusion or reoperation; infection which may require antibiotics; injury to bowel, bladder, ureters or other surrounding organs; need for additional procedures including laparotomy; thromboembolic phenomenon, incisional problems and other postoperative/anesthesia complications. Written informed consent was obtained.    FINDINGS:  10 week sized globular uterus, normal adnexa bilaterally.  Broad adhesions involving the lower uterine segment, vesicouterine peritoneum and anterior abdominal wall which were lysed using the Harmonic device and blunt methods, and resulted in significant blood loss. No injury to bladder was noted. No evidence of any other abdominal/pelvic abnormality.  Normal upper abdomen.  ANESTHESIA:    General INTRAVENOUS FLUIDS: 2200 ml ESTIMATED BLOOD LOSS: 300 ml SPECIMENS: Uterus, cervix, bilateral fallopian tubes. COMPLICATIONS: None immediate  PROCEDURE IN DETAIL:  The patient received intravenous antibiotics and had sequential compression devices applied to her lower extremities while in the preoperative area.  She was then taken to the operating room where general anesthesia was administered and was found to be adequate.  She was placed in the dorsal lithotomy position, and was prepped and draped in a sterile manner.  A Foley catheter was inserted into her bladder and attached to constant drainage and a uterine manipulator was then advanced into the uterus .   After an adequate timeout was performed, attention was then turned to the patient's abdomen where a 5-mm skin incision was made in the umbilical fold.  The Veress needle was carefully introduced into the peritoneal cavity through the abdominal wall.  Intraperitoneal placement was confirmed by drop in intraabdominal pressure with insufflation of carbon dioxide gas.  Adequate pneumoperitoneum was obtained, and the 5-mm trocar and sleeve were then advanced without difficulty into the abdomen where intraabdominal placement was confirmed by the laparoscope. A survey of the patient's pelvis and abdomen revealed the findings as above.   Bilateral 5-mm lower quadrant ports were then placed under direct visualization.  The pelvis was then carefully examined.  The round ligaments were then clamped and transected with the Harmonic device.  The fallopian tubes were freed from the underlying mesosalpinx and the uterine attachments.  The fallopian tubes were then removed from the abdomen.   Attention was then turned to the broad adhesions as noted above.  These were painstakingly lysed using the Harmonic device and blunt dissection.  There was significant bleeding noted; no bladder injury was noted.  The bladder flap was successfully created. The broad ligaments and uterine vessels were then serially clamped, cut, and suture ligated on both sides using the Harmonic device.  Excellent hemostasis was noted, and the decision was made to leave the trocars in place and proceed with completing the hysterectomy via the vaginal route .  Attention was then turned to her pelvis.  A weighted speculum was then placed in the vagina, and the anterior and posterior lips of the cervix were grasped bilaterally with tenaculums.  The cervix was then injected circumferentially with 0.5% Marcaine solution.  The cervix was then circumferentially incised, and the anterior cul-de-sac was entered sharply without difficulty and a retractor was placed.   The same procedure was performed posteriorly and the  posterior cul-de-sac was entered sharply without difficulty.  A long weighted speculum was inserted into the posterior cul-de-sac.  The Heaney clamp was then used to clamp the uterosacral ligaments on either side.  They were then cut and sutured ligated with 0 Vicryl, and were held with a tag for later identification. Of note, all sutures used in this case were 0 Vicryl unless otherwise noted.   The cardinal ligaments were then clamped, cut and ligated bilaterally.  The uterus was noted to be freed from all ligaments and was then delivered and sent to pathology.  Excellent hemostasis was noted at this point.  The uterus was noted to be freed from all ligaments and was then delivered and sent to pathology.  Of note, some morcellation was done to be able to deliver the uterus vaginally after there hysterectomy was completed.   After completion of the hysterectomy, all pedicles from the uterosacral ligament to the cornua were examined hemostasis was confirmed.  The uterosacral pedicles were then affixed to the ipsilateral vaginal cuff angles using 0 Vicryl sutures.  The vaginal cuff was then closed in a running locked fashion with 0 Vicryl with care given to incorporate the uterosacral pedicles bilaterally.  All instruments were then removed from the pelvis, and a vaginal packing was placed.   Attention was then returned to her abdomen which was insufflated again with carbon dioxide gas.  The laparoscope was used to survey the operative site, and it was found to be hemostatic.   No intraoperative injury to other surrounding organs was noted.  The abdomen was desufflated and all instruments were then removed from the patient's abdomen.  All skin incisions were closed with 4-0 Vicryl stitches and Dermabond. The patient tolerated the procedures well.  All instruments, needles, and sponge counts were correct x 2. The patient was taken to the recovery room awake,  extubated and in stable condition.   Jaynie Collins, MD, FACOG Attending Obstetrician & Gynecologist Faculty Practice, [facility] [place]

## 2013-01-22 NOTE — Anesthesia Postprocedure Evaluation (Signed)
  Anesthesia Post-op Note  Patient: Sheryl Mathis  Procedure(s) Performed: Procedure(s): LAPAROSCOPIC ASSISTED VAGINAL HYSTERECTOMY with Bilateral Fallopian Tubes and Ovaries (N/A)  Patient Location: PACU and Women's Unit  Anesthesia Type:General  Level of Consciousness: awake, alert , oriented and patient cooperative  Airway and Oxygen Therapy: Patient Spontanous Breathing  Post-op Pain: mild  Post-op Assessment: Post-op Vital signs reviewed, Patient's Cardiovascular Status Stable, Respiratory Function Stable and NAUSEA AND VOMITING PRESENT  Post-op Vital Signs: Reviewed and stable  Complications: No apparent anesthesia complications

## 2013-01-22 NOTE — Anesthesia Postprocedure Evaluation (Signed)
  Anesthesia Post-op Note  Patient: Sheryl Mathis  Procedure(s) Performed: Procedure(s): LAPAROSCOPIC ASSISTED VAGINAL HYSTERECTOMY with Bilateral Fallopian Tubes and Ovaries (N/A) Patient is awake and responsive. Pain and nausea are reasonably well controlled. Vital signs are stable and clinically acceptable. Oxygen saturation is clinically acceptable. There are no apparent anesthetic complications at this time. Patient is ready for discharge.

## 2013-01-23 ENCOUNTER — Encounter (HOSPITAL_COMMUNITY): Payer: Self-pay | Admitting: Obstetrics & Gynecology

## 2013-01-23 LAB — CBC
MCHC: 32.6 g/dL (ref 30.0–36.0)
MCV: 78.1 fL (ref 78.0–100.0)
Platelets: 372 10*3/uL (ref 150–400)
RDW: 16.5 % — ABNORMAL HIGH (ref 11.5–15.5)
WBC: 10.9 10*3/uL — ABNORMAL HIGH (ref 4.0–10.5)

## 2013-01-23 MED ORDER — AMITRIPTYLINE HCL 25 MG PO TABS: 25.0000 mg | ORAL_TABLET | Freq: Every day | ORAL | Status: DC

## 2013-01-23 MED ORDER — IBUPROFEN 600 MG PO TABS: 600.0000 mg | ORAL_TABLET | Freq: Four times a day (QID) | ORAL | Status: DC | PRN

## 2013-01-23 MED ORDER — DSS 100 MG PO CAPS: 100.0000 mg | ORAL_CAPSULE | Freq: Two times a day (BID) | ORAL | Status: DC | PRN

## 2013-01-23 MED ORDER — METOCLOPRAMIDE HCL 10 MG PO TABS: 10.0000 mg | ORAL_TABLET | Freq: Four times a day (QID) | ORAL | Status: DC | PRN

## 2013-01-23 MED ORDER — OXYCODONE-ACETAMINOPHEN 5-325 MG PO TABS: 1.0000 | ORAL_TABLET | Freq: Four times a day (QID) | ORAL | Status: DC | PRN

## 2013-01-23 NOTE — Discharge Summary (Signed)
Gynecology Physician Discharge Summary  Patient ID: [name] MRN: [id number] DOB/AGE: [birth year] 42 y.o.  Admit date: 01/22/2013 Discharge date: 01/23/2013  Preoperative Diagnoses: Abnormal uterine bleeding  Procedures:  Laparoscopic assisted vaginal hysterectomy, bilateral salpingectomy, lysis of adhesions  Results for orders placed during the hospital encounter of 01/22/13 (from the past 336 hour(s))  PREGNANCY, URINE   Collection Time    01/22/13  8:50 AM      Result Value Range   Preg Test, Ur NEGATIVE  NEGATIVE  TYPE AND SCREEN   Collection Time    01/22/13  9:00 AM      Result Value Range   ABO/RH(D) A NEG     Antibody Screen NEG     Sample Expiration 01/25/2013    CBC   Collection Time    01/23/13  5:20 AM      Result Value Range   WBC 10.9 (*) 4.0 - 10.5 K/uL   RBC 3.06 (*) 3.87 - 5.11 MIL/uL   Hemoglobin 7.8 (*) 12.0 - 15.0 g/dL   HCT 40.9 (*) 81.1 - 91.4 %   MCV 78.1  78.0 - 100.0 fL   MCH 25.5 (*) 26.0 - 34.0 pg   MCHC 32.6  30.0 - 36.0 g/dL   RDW 78.2 (*) 95.6 - 21.3 %   Platelets 372  150 - 400 K/uL  Results for orders placed during the hospital encounter of 01/15/13 (from the past 336 hour(s))  CBC   Collection Time    01/15/13  3:09 PM      Result Value Range   WBC 8.9  4.0 - 10.5 K/uL   RBC 4.11  3.87 - 5.11 MIL/uL   Hemoglobin 10.3 (*) 12.0 - 15.0 g/dL   HCT 08.6 (*) 57.8 - 46.9 %   MCV 78.6  78.0 - 100.0 fL   MCH 25.1 (*) 26.0 - 34.0 pg   MCHC 31.9  30.0 - 36.0 g/dL   RDW 62.9 (*) 52.8 - 41.3 %   Platelets 466 (*) 150 - 400 K/uL    Hospital Course:  Sheryl Mathis is a 42 y.o. [id number]  admitted for scheduled surgery.  She underwent the procedures as mentioned above, her operation was uncomplicated. For further details about surgery, please refer to the operative report. Patient had an uncomplicated postoperative course. By time of discharge on POD#1, her pain was controlled on oral pain medications; she was ambulating, voiding without  difficulty, tolerating regular diet and passing flatus. She was deemed stable for discharge to home.  Of note, patient was advised to take iron supplements for her asymptomatic anemia.  Discharge Exam: Blood pressure 100/69, pulse 74, temperature 97.8 F (36.6 C), temperature source Oral, resp. rate 16, height 5\' 7"  (1.702 m), weight 248 lb (112.492 kg), SpO2 99.00%. General appearance: alert and no distress Resp: clear to auscultation bilaterally Cardio: RRR GI: soft, non-tender; bowel sounds normal; no masses,  no organomegaly.   Incisions: C/D/I with Dermabond in place Pelvic: scant blood on pad Extremities: extremities normal, atraumatic, no cyanosis or edema and Homans sign is negative, no sign of DVT Discharged Condition: Stable  Disposition: 01-Home or Self Care       Future Appointments Provider Department Dept Phone   02/23/2013 11:00 AM Tereso Newcomer, MD [facility] [phone]       Medication List    STOP taking these medications       ciprofloxacin 500 MG tablet  Commonly known as:  CIPRO  fluconazole 150 MG tablet  Commonly known as:  DIFLUCAN     megestrol 40 MG tablet  Commonly known as:  MEGACE     promethazine 25 MG tablet  Commonly known as:  PHENERGAN      TAKE these medications       amitriptyline 25 MG tablet  Commonly known as:  ELAVIL  Take 1 tablet (25 mg total) by mouth at bedtime.     amitriptyline 25 MG tablet  Commonly known as:  ELAVIL  Take 1 tablet (25 mg total) by mouth at bedtime.     butalbital-acetaminophen-caffeine 50-325-40 MG per tablet  Commonly known as:  FIORICET  Take 1-2 tablets by mouth every 6 (six) hours as needed for headache.     docusate sodium 100 MG capsule  Commonly known as:  COLACE  Take 1 capsule (100 mg total) by mouth 2 (two) times daily as needed for constipation.     DSS 100 MG Caps  Take 100 mg by mouth 2 (two) times daily as needed for mild constipation or moderate constipation.      ibuprofen 600 MG tablet  Commonly known as:  ADVIL,MOTRIN  Take 1 tablet (600 mg total) by mouth every 6 (six) hours as needed.     ibuprofen 600 MG tablet  Commonly known as:  ADVIL,MOTRIN  Take 1 tablet (600 mg total) by mouth every 6 (six) hours as needed (mild pain).     Iron 28 MG Tabs  Take 1 tablet (28 mg total) by mouth 2 (two) times daily. 256 mg of ferrous gluconate     LORazepam 1 MG tablet  Commonly known as:  ATIVAN  Take 1 tablet (1 mg total) by mouth every 6 (six) hours as needed for anxiety.     metoCLOPramide 10 MG tablet  Commonly known as:  REGLAN  Take 1 tablet (10 mg total) by mouth every 6 (six) hours as needed for nausea or vomiting.     oxyCODONE-acetaminophen 5-325 MG per tablet  Commonly known as:  PERCOCET/ROXICET  Take 1-2 tablets by mouth every 4 (four) hours as needed for pain.     oxyCODONE-acetaminophen 5-325 MG per tablet  Commonly known as:  PERCOCET/ROXICET  Take 1-2 tablets by mouth every 6 (six) hours as needed for moderate pain or severe pain (moderate to severe pain (when tolerating fluids)).       Follow-up Information   Follow up with Tereso Newcomer, MD On 02/23/2013. (11am for postoperative appointment.  Call clinic or go to [facility] for any postoperative concerns.)    Specialty:  Obstetrics and Gynecology   Contact information:   [address] [place] [address] [phone]       Signed:  Jaynie Collins, MD, FACOG Attending Obstetrician & Gynecologist Faculty Practice, [facility] [place]

## 2013-01-23 NOTE — Progress Notes (Signed)
Patient's vaginal packing removed. Scant drainage, no clots present. Patient tolerated well. 

## 2013-01-23 NOTE — Progress Notes (Signed)
Pt is discharged in the care of R.N. Per  Wheelchair., Discharge instructions  With Rx were given to pt. Questions were   Asked and answered Abdominal incisions are clean and dry. Stable.No new orders/

## 2013-01-26 ENCOUNTER — Encounter: Payer: Self-pay | Admitting: *Deleted

## 2013-01-26 NOTE — OR Nursing (Signed)
PROCEDURE AS RECORDED BY O.R. R.N. INCORRECT FOR SURGERY ON 01/22/13. PRODEDURE SHOULD READ  LAPAROSCOPIC ASSISTED VAGINAL HYSTERECTOMY WITH BILATERAL SALPINGECTOMY AND LYSIS OF ADHESIONS. CORRECTED ON 01/26/13 BY Thalia Bloodgood RN.  UNABLE TO OPEN CHART AT THIS TIME TO CORRECT . ADDENDUM WRITTEN.  Ester Rink  RN

## 2013-02-02 ENCOUNTER — Encounter: Payer: Self-pay | Admitting: Obstetrics & Gynecology

## 2013-02-02 ENCOUNTER — Telehealth: Payer: Self-pay | Admitting: Obstetrics & Gynecology

## 2013-02-02 ENCOUNTER — Ambulatory Visit (INDEPENDENT_AMBULATORY_CARE_PROVIDER_SITE_OTHER): Payer: Self-pay | Admitting: Obstetrics & Gynecology

## 2013-02-02 DIAGNOSIS — Z90710 Acquired absence of both cervix and uterus: Secondary | ICD-10-CM

## 2013-02-02 DIAGNOSIS — C541 Malignant neoplasm of endometrium: Secondary | ICD-10-CM

## 2013-02-02 DIAGNOSIS — Z712 Person consulting for explanation of examination or test findings: Secondary | ICD-10-CM

## 2013-02-02 DIAGNOSIS — Z7189 Other specified counseling: Secondary | ICD-10-CM

## 2013-02-02 DIAGNOSIS — Z8542 Personal history of malignant neoplasm of other parts of uterus: Secondary | ICD-10-CM | POA: Insufficient documentation

## 2013-02-02 DIAGNOSIS — C549 Malignant neoplasm of corpus uteri, unspecified: Secondary | ICD-10-CM

## 2013-02-02 NOTE — Patient Instructions (Signed)
Appointment with Dr. Nelly Rout, GYN Oncology at [facility] on 02/12/13 at 12:15pm; arrive at 11:45 am Appointment with Dr. Jaynie Collins at [facility] on 02/23/13 at 11am

## 2013-02-02 NOTE — Progress Notes (Signed)
GYNECOLOGY CLINIC ENCOUNTER NOTE  42 y.o. [id number] here today for discussion of pathology results after recent LAVH, BS on 01/22/13.  Patient has no postoperative concerns.  She is accompanied by her boyfriend.  Physical exam was deferred.  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 - CERVIX: BENIGN SQUAMOUS MUCOSA AND ENDOCERVICAL MUCOSA, NO DYSPLASIA OR MALIGNANCY. - FALLOPIAN TUBE: BENIGN FALLOPIAN TUBAL TISSUE, NO EVIDENCE OF ENDOMETRIOSIS, ATYPIA OR MALIGNANCY.  Diagnosis discussed with patient and her boyfriend.  Appropriate support was given to them.  She was informed of an upcoming appointment with Dr. Laurette Schimke (Gynecologic Oncologist) at the [facility] on 02/12/13 at 12:15 pm; she will need to arrive 30 minutes early for the new patient encounter.  Patient understands that Dr. Nelly Rout will discuss any further surgery/treatment options with her but I will continue to follow along.  All questions were answered.  Will follow up on Dr. Forrestine Him recommendations.  Patient has a postoperative appointment with me here in clinic on 02/23/13 at 11 am.  Jaynie Collins, MD, FACOG Attending Obstetrician & Gynecologist Faculty Practice, [facility] of [place]

## 2013-02-02 NOTE — Telephone Encounter (Signed)
Faculty Practice OB/GYN Attending Phone Call Documentation  I placed a call to Sheryl Mathis and informed her of need for a clinic appointment to discuss pathology results.  Patient said she will be coming with her boyfriend after 1 pm today. Clinic staff notified.     Jaynie Collins, MD, FACOG Attending Obstetrician & Gynecologist Faculty Practice, [facility] of [place]

## 2013-02-12 ENCOUNTER — Ambulatory Visit: Payer: No Typology Code available for payment source | Attending: Gynecologic Oncology | Admitting: Gynecologic Oncology

## 2013-02-12 ENCOUNTER — Encounter: Payer: Self-pay | Admitting: Gynecologic Oncology

## 2013-02-12 VITALS — BP 148/84 | HR 85 | Temp 98.7°F | Resp 16 | Ht 67.24 in | Wt 247.3 lb

## 2013-02-12 DIAGNOSIS — Z90710 Acquired absence of both cervix and uterus: Secondary | ICD-10-CM | POA: Insufficient documentation

## 2013-02-12 DIAGNOSIS — Z79899 Other long term (current) drug therapy: Secondary | ICD-10-CM | POA: Insufficient documentation

## 2013-02-12 DIAGNOSIS — C549 Malignant neoplasm of corpus uteri, unspecified: Secondary | ICD-10-CM | POA: Insufficient documentation

## 2013-02-12 DIAGNOSIS — C541 Malignant neoplasm of endometrium: Secondary | ICD-10-CM

## 2013-02-12 NOTE — Patient Instructions (Addendum)
Uterine Cancer Uterine cancer is an abnormal growth of tissue (tumor) in the uterus that is cancerous (malignant). Unlike noncancerous (benign) tumors, malignant tumors can spread to other parts of your body. The wall of the uterus has two layers of tissue. The inner layer is the endometrium. The outer layer of muscle tissue is the myometrium. The most common type of uterine cancer begins in the endometrium. This is called endometrial cancer. Cancer that begins in the myometrium is called uterine sarcoma, which is very rare.  RISK FACTORS  Although the exact cause of uterine cancer is unknown, there are a number of risk factors that can increase your chances of getting uterine cancer. They include:  Your age. Uterine cancer occurs mostly in women older than 50 years.   Having an enlarged endometrium (endometrial hyperplasia).   Using hormone therapy.   Obesity.   Taking the drug tamoxifen.   White race.   Infertility.   Never being pregnant.   Beginning menstrual periods at an age younger than 12 years.   Having menstrual periods at an age older than 52 years.   Personal history of ovarian, intestinal, or colorectal cancer.   Having a family history of uterine cancer.   Having a family history of hereditary nonpolyposis colon cancer (HNPCC).   Having diabetes, high blood pressure, thyroid disease, or gallbladder disease.   Long-term use of high-dose birth control pills.   Exposure to radiation.   Smoking.  SIGNS AND SYMPTOMS   Abnormal vaginal bleeding or discharge. Bleeding may start as a watery, blood-streaked flow that gradually contains more blood.   Any vaginal bleeding after menopause.   Difficult or painful urination.   Pain during intercourse.   Pain in the pelvic area.  Mass in the vagina.  Pain or fullness in the abdomen.  Frequent urination.  Bleeding between periods.  Growth of the stomach.   Unexplained weight loss.   Uterine cancer usually occurs after menopause. However, it may also occur around the time that menopause begins. Abnormal vaginal bleeding is the most common symptom of uterine cancer. Women should not assume that abnormal vaginal bleeding is part of menopause. DIAGNOSIS  Your health care provider will ask about your medical history. He or she may also perform a number of procedures, such as:  A physical and pelvic exam. Your health care provider will feel your pelvis for any lumps.   Blood and urine tests.   X-rays.   Imaging tests, such as CT scans, ultrasonography, or MRIs.   A hysteroscopy to view the inside of your uterus.   A Pap test to sample cells from the cervix and upper vagina to check for abnormal cells.   Taking a tissue sample (biopsy) from the uterine lining to look for cancer cells.   A dilation and curettage (D&C). This involves stretching (dilation) the cervix and scraping (curettage) the inside lining of the uterus to get a tissue sample. The sample is examined under a microscope to look for cancer cells.  Your cancer will be staged to determine its severity and extent. Staging is a careful attempt to find out the size of the tumor, whether the cancer has spread, and if so, to what parts of the body. You may need to have more tests to determine the stage of your cancer. The test results will help determine what treatment plan is best for you. Cancer stages include:   Stage I The cancer is only found in the uterus.  Stage II  The cancer has spread to the cervix.  Stage III The cancer has spread outside the uterus, but not outside the pelvis. The cancer may have spread to the lymph nodes in the pelvis.  Stage IV The cancer has spread to other parts of the body, such as the bladder or rectum. TREATMENT  Most women with uterine cancer are treated with surgery. This includes removing the uterus, cervix, fallopian tubes, and ovaries (total hysterectomy). Your  lymph nodes near the tumor may also be removed. Some women have radiation, chemotherapy, or hormonal therapy. Other women have a combination of these therapies. HOME CARE INSTRUCTIONS   Only take over-the-counter or prescription medicines as directed by your health care provider.   Maintain a healthy diet.  Exercise regularly.   If you have diabetes, high blood pressure, thyroid disease, or gallbladder disease, follow your health care provider's instructions to keep it under control.   Do not smoke.   Consider joining a support group. This may help you learn to cope with the stress of having uterine cancer.   Seek advice to help you manage treatment side effects.   Keep all follow-up appointments as directed by your health care provider.  SEEK MEDICAL CARE IF:  You have increased stomach or pelvic pain.  You cannot urinate.  You have abnormal bleeding. Document Released: 02/19/2005 Document Revised: 10/22/2012 Document Reviewed: 08/08/2012 [facility] Patient Information 2014 [place], [place].  CT Scan A CT scan (computerized axial tomography, CAT scan) is a specialized, non-invasive X-ray picture. It uses X-rays and a computer to make pictures of different areas of your body. They can offer more detailed information than a regular X-ray exam. The CT scan provides data about internal organs, soft tissue structures, blood vessels, as well as bones. CT scans have many benefits which may include fast, painless, and accurate details of a patient's anatomy. The CT scanner is a large machine that has an opening through which your body moves through to take the pictures. A CT scan takes several minutes.  LET YOUR CAREGIVER KNOW IF:  You are pregnant or might be pregnant.  You have an allergy to any medicine.  You are allergic to any foods.  You are allergic to iodine or X-ray dye.  You have high blood pressure, kidney problems, diabetes, or asthma.  You are taking medicine for  diabetes.  You have had a previous CT scan. It is important to limit unnecessary testing. RISKS AND COMPLICATIONS  There are a few risks associated with CT scans. The risks may include:  An allergic reaction to contrast material.  Loose bowel movements after drinking the contrast material.  Cancer from excessive exposure to radiation. BEFORE THE PROCEDURE   You may need to drink a liquid or have IV contrast material injected to complete the exam. The IV contrast is needed to check the blood supply of the area being studied. The IV contrast is generally very safe.  Your caregiver may check lab tests before giving IV contrast. PROCEDURE   You will lie on a moving table.  You must remain still during the exam.  It may be necessary for you to hold your breath for 5 to 10 seconds on occasion, as instructed by the technician doing the scan.  Multiple pictures will be made during a typical CT scan. AFTER THE PROCEDURE   Nursing mothers should not breastfeed their babies for 24 hours after having a CT scan.  Patients taking certain medicines, such as metformin, may be asked to  have a follow-up blood test before going back on their medicine after receiving IV contrast.  Your results will be sent to a lab for examination. Finding out the results of your test Not all test results are available during your visit. If your test results are not back during the visit, make an appointment with your caregiver to find out the results. Do not assume everything is normal if you have not heard from your caregiver or the medical facility. It is important for you to follow up on all of your test results. Document Released: 03/29/2004 Document Revised: 05/14/2011 Document Reviewed: 09/17/2008 [facility] Patient Information 2014 [place], [place].

## 2013-02-12 NOTE — Progress Notes (Signed)
Consult Note: Gyn-Onc  Consult was requested by Dr. Maia Petties the evaluation of Sheryl Mathis 42 y.o. Mathis  CC:  Chief Complaint  Patient presents with  . Endometrial cancer    New Consult    Assessment/Plan:  Sheryl Mathis  is a 42 y.o.  year old status post laparoscopic hysterectomy unilateral salpingectomy for abnormal uterine bleeding with 2 prior endometrial sampling that were negative for malignancy. The specimen was morcellated into 4 pieces to facilitate removal from the vagina. 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 Of note only one fallopian tube was present in the final pathology.    I've recommended minimally invasive robotic bilateral oophorectomy unilateral salpingectomy pelvic and periaortic lymph node dissection to better clarify the appropriate adjuvant therapy.  Given the extensive angiolymphatic invasion  I have ordered a CT of the abdomen and pelvis to evaluate for adenopathy or intraperitoneal disease.   The patient will followup in 2-3 weeks to discuss the CT findings and to finalize the operative procedure that will be performed.   HPI: Sheryl Mathis  is a 42 y.o.G2 P2 last normal menstrual period in 11/03/2012. She reports abnormal uterine bleeding that began approximately 6 months ago. It became very heavy in September 2014 when she presented for evaluation and was initially placed on Megace.  12/01/12 ECC - BENIGN ENDOCERVICAL MUCOSA, - DETACHED FRAGMENTS OF SQUAMOUS EPITHELIUM; NEGATIVE FOR INTRAEPITHELIAL LESION OR MALIGANNCY. Endometrium, biopsy - FRAGMENTS OF ENDOMETRIOID-TYPE POLYP(S) WITH DECIDUALIZATION OF THE STROMA, CONSISTENT WITH HORMONE EFFECT. - THERE IS NO EVIDENCE OF HYPERPLASIA OR MALIGNANCY.  01/02/2013 Endometrium, curettage - FRAGMENTS SUGGESTIVE OF ENDOMETRIOID TYPE POLYP(S) WITH DECIDUALIZATION OF THE STROMA, CONSISTENT WITH HORMONE  EFFECT. - BENIGN ENDOCERVICAL TYPE MUCOSA. - THERE IS NO EVIDENCE OF HYPERPLASIA OR MALIGNANCY.  On 01/22/2013 she underwent a total laparoscopic hysterectomy unilateral salpingectomy with "some morcellation was done to be able to deliver the uterus vaginally after the hysterectomy was completed"  Uterus and bilateral fallopian tubes, with cervix ( 4 sections) Specimen: Uterus, cervix, one fallopian tube Procedure: Hysterectomy and salpingectomy Lymph node sampling performed: No Specimen integrity: Fragmented, please see gross description for details. Maximum tumor size: Estimated size 6.5 cm, grossly Histologic type: Invasive endometrioid carcinoma Grade: FIGO Grade II Myometrial invasion: 0.9 cm where myometrium is 2.4 cm in thickness Cervical stromal involvement: No Lymph - vascular invasion: Present, extensively FIGO Stage (based on pathologic findings, needs clinical correlation): At least IA Comment:  Sections show an invasive endometrioid carcinoma involving the upper portion of the uterus, estimated 6.5 cm in greatest dimension. There is extensive angiolymphatic invasion present. IHC Expression Result: MLH1: Preserved nuclear expression (greater 50% tumor expression) MSH2: Preserved nuclear expression (greater 50% tumor expression) MSH6: Preserved nuclear expression (greater 50% tumor expression) PMS2: Preserved nuclear expression (greater 50% tumor expression) * Internal control demonstrates intact nuclear expression  Sheryl Mathis feels well she denies vaginal bleeding she states that her appetite is good there is some fatigue she denies weight loss or been no changes in her bowel or bladder routines. She denies a family history of malignancy.  Current Meds:  Outpatient Encounter Prescriptions as of 02/12/2013  Medication Sig  . amitriptyline (ELAVIL) 25 MG tablet Take 1 tablet (25 mg total) by mouth at bedtime.  . butalbital-acetaminophen-caffeine (FIORICET) 50-325-40 MG per tablet  Take 1-2 tablets by mouth every 6 (six) hours as needed for headache.  . docusate sodium (COLACE) 100 MG capsule  Take 1 capsule (100 mg total) by mouth 2 (two) times daily as needed for constipation.  . Ferrous Sulfate (IRON) 28 MG TABS Take 1 tablet (28 mg total) by mouth 2 (two) times daily. 256 mg of ferrous gluconate  . ibuprofen (ADVIL,MOTRIN) 600 MG tablet Take 1 tablet (600 mg total) by mouth every 6 (six) hours as needed.  Marland Kitchen LORazepam (ATIVAN) 1 MG tablet Take 1 tablet (1 mg total) by mouth every 6 (six) hours as needed for anxiety.  . metoCLOPramide (REGLAN) 10 MG tablet Take 1 tablet (10 mg total) by mouth every 6 (six) hours as needed for nausea or vomiting.  Marland Kitchen oxyCODONE-acetaminophen (PERCOCET/ROXICET) 5-325 MG per tablet Take 1-2 tablets by mouth every 4 (four) hours as needed for pain.  . [DISCONTINUED] amitriptyline (ELAVIL) 25 MG tablet Take 1 tablet (25 mg total) by mouth at bedtime.  . [DISCONTINUED] docusate sodium 100 MG CAPS Take 100 mg by mouth 2 (two) times daily as needed for mild constipation or moderate constipation.  . [DISCONTINUED] ibuprofen (ADVIL,MOTRIN) 600 MG tablet Take 1 tablet (600 mg total) by mouth every 6 (six) hours as needed (mild pain).  . [DISCONTINUED] oxyCODONE-acetaminophen (PERCOCET/ROXICET) 5-325 MG per tablet Take 1-2 tablets by mouth every 6 (six) hours as needed for moderate pain or severe pain (moderate to severe pain (when tolerating fluids)).    Allergy: No Known Allergies  Social Hx:   History   Social History  . Marital Status: Legally Separated    Spouse Name: N/A    Number of Children: N/A  . Years of Education: N/A   Occupational History  . Not on file.   Social History Main Topics  . Smoking status: Current Every Day Smoker -- 0.50 packs/day    Types: Cigarettes  . Smokeless tobacco: Not on file  . Alcohol Use: No  . Drug Use: No  . Sexual Activity: Not Currently    Birth Control/ Protection: Surgical   Other Topics  Concern  . Not on file   Social History Narrative  . No narrative on file    Past Surgical Hx:  Past Surgical History  Procedure Laterality Date  . Cesarean section      X2  . Tubal ligation    . Dilitation & currettage/hystroscopy with hydrothermal ablation N/A 01/01/2013    Procedure: DILATATION & CURETTAGE/HYSTEROSCOPY WITH ATTEMPTED HYDROTHERMAL ABLATION: Change over to THERMACHOICE ABLATION @ 1505;  Surgeon: Tereso Newcomer, MD;  Location: [facility];  Service: Gynecology;  Laterality: N/A;  . Laparoscopic assisted vaginal hysterectomy N/A 01/22/2013    Procedure: LAPAROSCOPIC ASSISTED VAGINAL HYSTERECTOMY with Bilateral Fallopian Tubes and Ovaries;  Surgeon: Tereso Newcomer, MD;  Location: [facility];  Service: Gynecology;  Laterality: N/A;    Past Medical Hx:  Past Medical History  Diagnosis Date  . Anemia   . Menorrhagia   . Headache(784.0)   . Depression     Past Gynecological History: Gravida 2 per 2 menarche at age 44 with regular menses until 6 months ago. Reports oral contraceptive pill use for 14 years in the remote past Patient's last menstrual period was 11/03/2012.  Family Hx:  Family History  Problem Relation Age of Onset  . Diabetes Mother     Review of Systems:  Constitutional  Feels well,  Cardiovascular  No chest pain, shortness of breath, or edema  Pulmonary  No cough or wheeze.  Gastro Intestinal  No nausea, vomitting, or diarrhoea. No bright red blood per rectum, no abdominal pain,  change in bowel movement, or constipation.  Genito Urinary  No frequency, urgency, dysuria Musculo Skeletal  No myalgia, arthralgia, joint swelling or pain  Neurologic  No weakness, numbness, change in gait,  Psychology  No depression, anxiety, insomnia.   Vitals:  Blood pressure 148/84, pulse 85, temperature 98.7 F (37.1 C), temperature source Oral, resp. rate 16, height 5' 7.24" (1.708 m), weight 247 lb 4.8 oz (112.175 kg), last menstrual period  11/03/2012.  Physical Exam: WD in NAD Neck  Supple NROM, without any enlargements.  Lymph Node Survey No cervical supraclavicular or inguinal adenopathy Cardiovascular  Pulse normal rate, regularity and rhythm. S1 and S2 normal.  Lungs  Clear to auscultation bilateraly. Good air movement.  Skin  No rash/lesions/breakdown  Psychiatry  Alert and oriented to person, place, and time  Abdomen  Normoactive bowel sounds, abdomen soft, non-tender and obese. Surgical  sites intact without evidence of hernia or masses.  Back No CVA tenderness Genito Urinary  Vulva/vagina: Normal external Mathis genitalia.  No lesions. No discharge or bleeding.  Bladder/urethra:  No lesions or masses  Vagina:No bleeding or discharge, midportion of cuff open. Peritoneum intact.  Adnexa: No palpable masses. Rectal  Good tone, no masses no cul de sac nodularity.  Extremities  No bilateral cyanosis, clubbing or edema.   Laurette Schimke, MD, PhD 02/12/2013, 12:52 PM

## 2013-02-16 ENCOUNTER — Other Ambulatory Visit (HOSPITAL_COMMUNITY): Payer: Self-pay

## 2013-02-18 ENCOUNTER — Ambulatory Visit (HOSPITAL_COMMUNITY)
Admission: RE | Admit: 2013-02-18 | Discharge: 2013-02-18 | Disposition: A | Discharge status: Home / Self Care | Payer: No Typology Code available for payment source | Source: Ambulatory Visit | Attending: Gynecologic Oncology | Admitting: Gynecologic Oncology

## 2013-02-18 ENCOUNTER — Encounter (HOSPITAL_COMMUNITY): Payer: Self-pay

## 2013-02-18 DIAGNOSIS — Z923 Personal history of irradiation: Secondary | ICD-10-CM | POA: Insufficient documentation

## 2013-02-18 DIAGNOSIS — Z90710 Acquired absence of both cervix and uterus: Secondary | ICD-10-CM | POA: Insufficient documentation

## 2013-02-18 DIAGNOSIS — I700 Atherosclerosis of aorta: Secondary | ICD-10-CM | POA: Insufficient documentation

## 2013-02-18 DIAGNOSIS — K8020 Calculus of gallbladder without cholecystitis without obstruction: Secondary | ICD-10-CM | POA: Insufficient documentation

## 2013-02-18 DIAGNOSIS — C541 Malignant neoplasm of endometrium: Secondary | ICD-10-CM

## 2013-02-18 DIAGNOSIS — C549 Malignant neoplasm of corpus uteri, unspecified: Secondary | ICD-10-CM | POA: Insufficient documentation

## 2013-02-18 MED ORDER — IOHEXOL 300 MG/ML  SOLN
100.0000 mL | Freq: Once | INTRAMUSCULAR | Status: AC | PRN
  Administered 2013-02-18: 100 mL via INTRAVENOUS

## 2013-02-23 ENCOUNTER — Encounter: Payer: Self-pay | Admitting: Obstetrics & Gynecology

## 2013-02-23 ENCOUNTER — Ambulatory Visit (INDEPENDENT_AMBULATORY_CARE_PROVIDER_SITE_OTHER): Payer: Self-pay | Admitting: Obstetrics & Gynecology

## 2013-02-23 VITALS — BP 126/84 | HR 70 | Temp 97.0°F | Ht 67.0 in | Wt 253.0 lb

## 2013-02-23 DIAGNOSIS — F411 Generalized anxiety disorder: Secondary | ICD-10-CM

## 2013-02-23 DIAGNOSIS — Z01812 Encounter for preprocedural laboratory examination: Secondary | ICD-10-CM

## 2013-02-23 DIAGNOSIS — N920 Excessive and frequent menstruation with regular cycle: Secondary | ICD-10-CM

## 2013-02-23 DIAGNOSIS — Z01818 Encounter for other preprocedural examination: Secondary | ICD-10-CM

## 2013-02-23 DIAGNOSIS — R9389 Abnormal findings on diagnostic imaging of other specified body structures: Secondary | ICD-10-CM

## 2013-02-23 DIAGNOSIS — Z09 Encounter for follow-up examination after completed treatment for conditions other than malignant neoplasm: Secondary | ICD-10-CM

## 2013-02-23 DIAGNOSIS — F419 Anxiety disorder, unspecified: Secondary | ICD-10-CM

## 2013-02-23 MED ORDER — AMITRIPTYLINE HCL 50 MG PO TABS: 50.0000 mg | ORAL_TABLET | Freq: Every day | ORAL | Status: DC

## 2013-02-23 MED ORDER — ALPRAZOLAM 1 MG PO TABS: 1.0000 mg | ORAL_TABLET | Freq: Three times a day (TID) | ORAL | Status: DC | PRN

## 2013-02-23 NOTE — Patient Instructions (Signed)
Return to clinic for any scheduled appointments or for any gynecologic concerns as needed.   

## 2013-02-24 NOTE — Progress Notes (Signed)
   GYNECOLOGY CLINIC ENCOUNTER NOTE  History:  42 y.o. [id number] here today for postoperative examination s/p LAVH, BS on 01/22/13. 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 Patient has been seen by Dr. Laurette Schimke (GYN Oncology) and the plan is to perform a robotic bilateral oophorectomy with pelvic and periaortic lymph node dissection to better clarify the appropriate adjuvant therapy. Patient is doing well, reports having a lot of support from her family, friends and community.  Denies any postoperative pain, bleeding or other concerns.  The following portions of the patient's history were reviewed and updated as appropriate: allergies, current medications, past family history, past medical history, past social history, past surgical history and problem list.  Review of Systems:  Pertinent items are noted in HPI.  Objective:  Physical Exam BP 126/84  Pulse 70  Temp(Src) 97 F (36.1 C) (Oral)  Ht 5\' 7"  (1.702 m)  Wt 253 lb (114.76 kg)  BMI 39.62 kg/m2  LMP 11/03/2012 Gen: NAD Abd: Soft, nontender and nondistended, well-healed laparoscopic incisions Pelvic: Normal appearing external genitalia; normal appearing vaginal mucosa and cervix. Vaginal cuff is healing well, sutures still visible.  Labs and Imaging  02/18/2013   CT ABDOMEN AND PELVIS WITH CONTRAST CLINICAL DATA:  Endometrial cancer diagnosed in 2014. Radiation therapy complete. Hysterectomy 01/22/2013.   TECHNIQUE: Multidetector CT imaging of the abdomen and pelvis was performed using the standard protocol following bolus administration of intravenous contrast.  CONTRAST:  OMNIPAQUE IOHEXOL 300 MG/ML  SOLN  COMPARISON:  11/14/2012 pelvic ultrasound  FINDINGS: Lower Chest: Clear lung bases. Normal heart size without pericardial or pleural effusion.  Abdomen/Pelvis: Normal liver, spleen, stomach, pancreas. Cholelithiasis without acute  cholecystitis or biliary ductal dilatation. Normal adrenal glands and left kidney. There is mild prominence of the right renal pelvis and proximal right ureter, without obstructive cause identified.  Age advanced aortic atherosclerosis. No retroperitoneal or retrocrural adenopathy. Normal colon, appendix, and terminal ileum. Normal small bowel without abdominal ascites. No evidence of omental or peritoneal disease. No pelvic adenopathy. Residual normal appearing left ovary on image 72 and right ovary on image 76. No adnexal mass. There is mild pelvic fascia thickening and trace fluid which is presumably postoperative.  Bones/Musculoskeletal:  No acute osseous abnormality.  IMPRESSION: 1. Status post hysterectomy, without evidence of residual/recurrent or metastatic disease. 2. Cholelithiasis.   Electronically Signed   By: Jeronimo Greaves M.D.   On: 02/18/2013 16:02    Assessment & Plan:  Patient has a stable postoperative examination; pelvic rest recommended for four more weeks She will follow up with Dr. Nelly Rout regarding management of her endometrioid carcinoma, but I will follow along closely and will see her for surveillance clinic visits Appropriate support given to patient.    Jaynie Collins, MD, FACOG Attending Obstetrician & Gynecologist Faculty Practice, [facility] [facility]

## 2013-03-12 ENCOUNTER — Encounter: Payer: Self-pay | Admitting: Gynecologic Oncology

## 2013-03-12 ENCOUNTER — Ambulatory Visit: Payer: No Typology Code available for payment source | Attending: Gynecologic Oncology | Admitting: Gynecologic Oncology

## 2013-03-12 VITALS — BP 146/85 | HR 78 | Temp 98.6°F | Wt 253.6 lb

## 2013-03-12 DIAGNOSIS — D649 Anemia, unspecified: Secondary | ICD-10-CM | POA: Insufficient documentation

## 2013-03-12 DIAGNOSIS — C541 Malignant neoplasm of endometrium: Secondary | ICD-10-CM

## 2013-03-12 DIAGNOSIS — Z79899 Other long term (current) drug therapy: Secondary | ICD-10-CM | POA: Insufficient documentation

## 2013-03-12 DIAGNOSIS — F329 Major depressive disorder, single episode, unspecified: Secondary | ICD-10-CM | POA: Insufficient documentation

## 2013-03-12 DIAGNOSIS — Z90710 Acquired absence of both cervix and uterus: Secondary | ICD-10-CM | POA: Insufficient documentation

## 2013-03-12 DIAGNOSIS — C549 Malignant neoplasm of corpus uteri, unspecified: Secondary | ICD-10-CM | POA: Insufficient documentation

## 2013-03-12 DIAGNOSIS — F3289 Other specified depressive episodes: Secondary | ICD-10-CM | POA: Insufficient documentation

## 2013-03-12 NOTE — Progress Notes (Signed)
Consult Note: Gyn-Onc  Consult was requested by Dr. Dellie Burns the evaluation of Sheryl Mathis 43 y.o. female  CC:  Chief Complaint  Patient presents with  . Endo ca    Follow up     Assessment/Plan:  Sheryl Mathis  is a 43 y.o.  year old status post laparoscopic hysterectomy bilateral salpingectomy for abnormal uterine bleeding with 2 prior endometrial sampling that were negative for malignancy. The specimen was morcellated into 4 pieces to facilitate removal from the vagina. 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 Of note only one fallopian tube was present in the final pathology.  CT imaging obtained in December of 2050 was unremarkable for evidence of intraperitoneal disease or adenopathy.  Given the extensive angiolymphatic invasion I've recommended minimally invasive robotic bilateral oophorectomy pelvic and possible periaortic lymph node dissection to better clarify the appropriate adjuvant therapy.   The risks of the procedure discussed were that of infection bleeding damage to surrounding structures nerve damage visual changes facial edema prolonged hospitalization and reoperation.  The patient understands that the procedure will be completed by Dr. Ned Clines on 03/31/2013.    HPI: Sheryl. Sheryl Mathis  is a 43 y.o.G2 P2 last normal menstrual period in 11/03/2012. She reports abnormal uterine bleeding that began approximately 6 months ago. It became very heavy in September 2014 when she presented for evaluation and was initially placed on Megace.  12/01/12 ECC - BENIGN ENDOCERVICAL MUCOSA, - DETACHED FRAGMENTS OF SQUAMOUS EPITHELIUM; NEGATIVE FOR INTRAEPITHELIAL LESION OR MALIGANNCY. Endometrium, biopsy - FRAGMENTS OF ENDOMETRIOID-TYPE POLYP(S) WITH DECIDUALIZATION OF THE STROMA, CONSISTENT WITH HORMONE EFFECT. - THERE IS NO EVIDENCE OF HYPERPLASIA OR MALIGNANCY. Repeat biopsy on  01/02/2013 Endometrium, curettage - FRAGMENTS SUGGESTIVE OF ENDOMETRIOID TYPE POLYP(S) WITH DECIDUALIZATION OF THE STROMA, CONSISTENT WITH HORMONE EFFECT. - BENIGN ENDOCERVICAL TYPE MUCOSA. - THERE IS NO EVIDENCE OF HYPERPLASIA OR MALIGNANCY.  On 01/22/2013 she underwent a total laparoscopic hysterectomy unilateral salpingectomy with "some  vaginal morcellation to deliver the uterus vaginally after the hysterectomy was completed"  Uterus and bilateral fallopian tubes, with cervix ( 4 sections) Specimen: Uterus, cervix, one fallopian tube Procedure: Hysterectomy and salpingectomy Lymph node sampling performed: No Specimen integrity: Fragmented, please see gross description for details. Maximum tumor size: Estimated size 6.5 cm, grossly Histologic type: Invasive endometrioid carcinoma Grade: FIGO Grade II Myometrial invasion: 0.9 cm where myometrium is 2.4 cm in thickness Cervical stromal involvement: No Lymph - vascular invasion: Present, extensively FIGO Stage (based on pathologic findings, needs clinical correlation): At least IA Comment:  Sections show an invasive endometrioid carcinoma involving the upper portion of the uterus, estimated 6.5 cm in greatest dimension. There is extensive angiolymphatic invasion present. IHC Expression Result: MLH1: Preserved nuclear expression (greater 50% tumor expression) MSH2: Preserved nuclear expression (greater 50% tumor expression) MSH6: Preserved nuclear expression (greater 50% tumor expression) PMS2: Preserved nuclear expression (greater 50% tumor expression) * Internal control demonstrates intact nuclear expression  Sheryl Mathis feels well she denies vaginal bleeding she states that her appetite is good there is some fatigue she denies weight loss or been no changes in her bowel or bladder routines. She denies a family history of malignancy.  Discussion with Dr. Harolyn Rutherford confirmed that there was vaginal not intra-abdominal morcellation and that both  fallopian tubes were removed, although only one was in the pathology specimen.  CT 12/17/2015to assess for nodal or intraperioneal disease notable for no retroperitoneal or retrocrural adenopathy.  Normal colon, appendix, and terminal ileum. Normal small bowel without abdominal ascites. No evidence of omental  or peritoneal disease. No pelvic adenopathy. Residual normal appearing left ovary on image 72 and right ovary on image 76. No adnexal mass. There is mild pelvic fascia thickening and trace fluid which is presumably postoperative.    Current Meds:  Outpatient Encounter Prescriptions as of 03/12/2013  Medication Sig  . ALPRAZolam (XANAX) 1 MG tablet Take 1 tablet (1 mg total) by mouth 3 (three) times daily as needed for anxiety.  Marland Kitchen amitriptyline (ELAVIL) 50 MG tablet Take 1 tablet (50 mg total) by mouth at bedtime.  . butalbital-acetaminophen-caffeine (FIORICET) 50-325-40 MG per tablet Take 1-2 tablets by mouth every 6 (six) hours as needed for headache.  . docusate sodium (COLACE) 100 MG capsule Take 1 capsule (100 mg total) by mouth 2 (two) times daily as needed for constipation.  . Ferrous Sulfate (IRON) 28 MG TABS Take 1 tablet (28 mg total) by mouth 2 (two) times daily. 256 mg of ferrous gluconate  . ibuprofen (ADVIL,MOTRIN) 600 MG tablet Take 1 tablet (600 mg total) by mouth every 6 (six) hours as needed.  Marland Kitchen LORazepam (ATIVAN) 1 MG tablet Take 1 tablet (1 mg total) by mouth every 6 (six) hours as needed for anxiety.  . metoCLOPramide (REGLAN) 10 MG tablet Take 1 tablet (10 mg total) by mouth every 6 (six) hours as needed for nausea or vomiting.  Marland Kitchen oxyCODONE-acetaminophen (PERCOCET/ROXICET) 5-325 MG per tablet Take 1-2 tablets by mouth every 4 (four) hours as needed for pain.    Allergy: No Known Allergies  Social Hx:   History   Social History  . Marital Status: Legally Separated    Spouse Name: N/A    Number of Children: N/A  . Years of Education: N/A   Occupational History  .  Not on file.   Social History Main Topics  . Smoking status: Current Every Day Smoker -- 0.50 packs/day    Types: Cigarettes  . Smokeless tobacco: Not on file  . Alcohol Use: No  . Drug Use: No  . Sexual Activity: Not Currently    Birth Control/ Protection: Surgical   Other Topics Concern  . Not on file   Social History Narrative  . No narrative on file    Past Surgical Hx:  Past Surgical History  Procedure Laterality Date  . Cesarean section      X2  . Tubal ligation    . Dilitation & currettage/hystroscopy with hydrothermal ablation N/A 01/01/2013    Procedure: DILATATION & CURETTAGE/HYSTEROSCOPY WITH ATTEMPTED HYDROTHERMAL ABLATION: Change over to [place] @ 1505;  Surgeon: Osborne Oman, MD;  Location: [facility];  Service: Gynecology;  Laterality: N/A;  . Laparoscopic assisted vaginal hysterectomy N/A 01/22/2013    Procedure: LAPAROSCOPIC ASSISTED VAGINAL HYSTERECTOMY with Bilateral Fallopian Tubes and Ovaries;  Surgeon: Osborne Oman, MD;  Location: [facility];  Service: Gynecology;  Laterality: N/A;    Past Medical Hx:  Past Medical History  Diagnosis Date  . Anemia   . Menorrhagia   . Headache(784.0)   . Depression     Past Gynecological History: Gravida 2 per 2 menarche at age 55 with regular menses until 6 months ago. Reports oral contraceptive pill use for 14 years in the remote past Patient's last menstrual period was 11/03/2012.  Family Hx:  Family History  Problem Relation Age of Onset  . Diabetes Mother     Review of Systems:  Constitutional  Feels  well,  Cardiovascular  No chest pain, shortness of breath, or edema  Pulmonary  No cough or wheeze.  Gastro Intestinal  No nausea, vomitting, or diarrhoea. No bright red blood per rectum, no abdominal pain, change in bowel movement, or constipation.  Genito Urinary  No frequency, urgency, dysuria Musculo Skeletal  No myalgia, arthralgia, joint swelling or pain  Neurologic  No weakness,  numbness, change in gait,  Psychology  No depression, anxiety, insomnia.   Vitals:  Blood pressure 146/85, pulse 78, temperature 98.6 F (37 C), temperature source Oral, weight 253 lb 9.6 oz (115.032 kg), last menstrual period 11/03/2012, SpO2 99.00%.  Physical Exam: WD in NAD Neck  Supple NROM, without any enlargements.  Lymph Node Survey No cervical supraclavicular or inguinal adenopathy Cardiovascular  Pulse normal rate, regularity and rhythm. Lungs  Clear to auscultation bilateraly. Good air movement.  Skin  No rash/lesions/breakdown  Psychiatry  Alert and oriented to person, place, and time  Abdomen  Normoactive bowel sounds, abdomen soft, non-tender and obese. Gonzalez surgical  sites intact without evidence of hernia or masses.  Back No CVA tenderness Genito Urinary  Vulva/vagina: Normal external female genitalia.  No lesions. No discharge or bleeding.  Bladder/urethra:  No lesions or masses  Vagina:No bleeding or discharge,   Adnexa: No palpable masses. Rectal  Good tone, no masses no cul de sac nodularity.  Extremities  No bilateral cyanosis, clubbing or edema.   Janie Morning, MD, PhD 03/12/2013, 5:54 PM

## 2013-03-12 NOTE — Patient Instructions (Signed)
CT results summary IMPRESSION:  1. Status post hysterectomy, without evidence of residual/recurrent or metastatic disease.  2. Cholelithiasis.  RBPLND PALNS washings with Dr. Alycia Rossetti 03/31/2013.

## 2013-03-24 ENCOUNTER — Encounter (HOSPITAL_COMMUNITY): Payer: Self-pay | Admitting: Pharmacy Technician

## 2013-03-26 ENCOUNTER — Ambulatory Visit (HOSPITAL_COMMUNITY)
Admission: RE | Admit: 2013-03-26 | Discharge: 2013-03-26 | Disposition: A | Discharge status: Home / Self Care | Payer: No Typology Code available for payment source | Source: Ambulatory Visit | Attending: Gynecologic Oncology | Admitting: Gynecologic Oncology

## 2013-03-26 ENCOUNTER — Encounter (HOSPITAL_COMMUNITY)
Admission: RE | Admit: 2013-03-26 | Discharge: 2013-03-26 | Disposition: A | Discharge status: Home / Self Care | Payer: No Typology Code available for payment source | Source: Ambulatory Visit | Attending: Gynecologic Oncology | Admitting: Gynecologic Oncology

## 2013-03-26 ENCOUNTER — Encounter (HOSPITAL_COMMUNITY): Payer: Self-pay

## 2013-03-26 DIAGNOSIS — M47814 Spondylosis without myelopathy or radiculopathy, thoracic region: Secondary | ICD-10-CM | POA: Insufficient documentation

## 2013-03-26 DIAGNOSIS — C549 Malignant neoplasm of corpus uteri, unspecified: Secondary | ICD-10-CM | POA: Insufficient documentation

## 2013-03-26 HISTORY — DX: Anxiety disorder, unspecified: F41.9

## 2013-03-26 HISTORY — DX: Malignant (primary) neoplasm, unspecified: C80.1

## 2013-03-26 HISTORY — DX: Adverse effect of unspecified anesthetic, initial encounter: T41.45XA

## 2013-03-26 HISTORY — DX: Other complications of anesthesia, initial encounter: T88.59XA

## 2013-03-26 LAB — COMPREHENSIVE METABOLIC PANEL
ALBUMIN: 3.6 g/dL (ref 3.5–5.2)
ALT: 30 U/L (ref 0–35)
AST: 27 U/L (ref 0–37)
Alkaline Phosphatase: 75 U/L (ref 39–117)
BUN: 11 mg/dL (ref 6–23)
CO2: 27 mEq/L (ref 19–32)
CREATININE: 0.47 mg/dL — AB (ref 0.50–1.10)
Calcium: 9.8 mg/dL (ref 8.4–10.5)
Chloride: 99 mEq/L (ref 96–112)
GFR calc Af Amer: >90 mL/min (ref >90)
GFR calc non Af Amer: >90 mL/min (ref >90)
Glucose, Bld: 74 mg/dL (ref 70–99)
Potassium: 4.2 mEq/L (ref 3.7–5.3)
Sodium: 137 mEq/L (ref 137–147)
Total Bilirubin: <0.2 mg/dL — ABNORMAL LOW (ref 0.3–1.2)
Total Protein: 8.4 g/dL — ABNORMAL HIGH (ref 6.0–8.3)

## 2013-03-26 LAB — URINALYSIS, ROUTINE W REFLEX MICROSCOPIC
Bilirubin Urine: NEGATIVE
GLUCOSE, UA: 500 mg/dL — AB
Hgb urine dipstick: NEGATIVE
Ketones, ur: NEGATIVE mg/dL
LEUKOCYTES UA: NEGATIVE
Nitrite: NEGATIVE
PH: 6.5 (ref 5.0–8.0)
Protein, ur: NEGATIVE mg/dL
SPECIFIC GRAVITY, URINE: 1.016 (ref 1.005–1.030)
Urobilinogen, UA: 0.2 mg/dL (ref 0.0–1.0)

## 2013-03-26 LAB — CBC WITH DIFFERENTIAL/PLATELET
BASOS PCT: 1 % (ref 0–1)
Basophils Absolute: 0.1 10*3/uL (ref 0.0–0.1)
EOS ABS: 0.3 10*3/uL (ref 0.0–0.7)
Eosinophils Relative: 3 % (ref 0–5)
HEMATOCRIT: 37.3 % (ref 36.0–46.0)
HEMOGLOBIN: 12.1 g/dL (ref 12.0–15.0)
Lymphocytes Relative: 36 % (ref 12–46)
Lymphs Abs: 2.9 10*3/uL (ref 0.7–4.0)
MCH: 24.5 pg — ABNORMAL LOW (ref 26.0–34.0)
MCHC: 32.4 g/dL (ref 30.0–36.0)
MCV: 75.5 fL — ABNORMAL LOW (ref 78.0–100.0)
MONOS PCT: 9 % (ref 3–12)
Monocytes Absolute: 0.7 10*3/uL (ref 0.1–1.0)
NEUTROS PCT: 51 % (ref 43–77)
Neutro Abs: 4.2 10*3/uL (ref 1.7–7.7)
Platelets: 403 10*3/uL — ABNORMAL HIGH (ref 150–400)
RBC: 4.94 MIL/uL (ref 3.87–5.11)
RDW: 15.6 % — AB (ref 11.5–15.5)
WBC: 8.2 10*3/uL (ref 4.0–10.5)

## 2013-03-26 NOTE — Patient Instructions (Addendum)
YOUR SURGERY IS SCHEDULED AT [facility]  ON:  Tuesday  1/27  REPORT TO  SHORT STAY CENTER AT:  8:00 AM      PHONE # FOR SHORT STAY IS [phone]  CLEAR LIQUID DIET ALL DAY Monday - THE DAY BEFORE YOUR SURGERY.  SEE LIST OF CLEAR LIQUIDS PROVIDED.  DO NOT EAT OR DRINK ANYTHING AFTER MIDNIGHT THE NIGHT BEFORE YOUR SURGERY.  YOU MAY BRUSH YOUR TEETH, RINSE OUT YOUR MOUTH--BUT NO WATER, NO FOOD, NO CHEWING GUM, NO MINTS, NO CANDIES, NO CHEWING TOBACCO.  PLEASE TAKE THE FOLLOWING MEDICATIONS THE AM OF YOUR SURGERY WITH A FEW SIPS OF WATER:  XANAX   DO NOT BRING VALUABLES, MONEY, CREDIT CARDS.  DO NOT WEAR JEWELRY, MAKE-UP, NAIL POLISH AND NO METAL PINS OR CLIPS IN YOUR HAIR. CONTACT LENS, DENTURES / PARTIALS, GLASSES SHOULD NOT BE WORN TO SURGERY AND IN MOST CASES-HEARING AIDS WILL NEED TO BE REMOVED.  BRING YOUR GLASSES CASE, ANY EQUIPMENT NEEDED FOR YOUR CONTACT LENS. FOR PATIENTS ADMITTED TO THE HOSPITAL--CHECK OUT TIME THE DAY OF DISCHARGE IS 11:00 AM.  ALL INPATIENT ROOMS ARE PRIVATE - WITH BATHROOM, TELEPHONE, TELEVISION AND WIFI INTERNET.                                                    PLEASE READ OVER ANY  FACT SHEETS THAT YOU WERE GIVEN: BLOOD TRANSFUSION INFORMATION, INCENTIVE SPIROMETER INFORMATION. REMEMBER TO DO DEEP BREATHING, COUGHING, TURNING WHILE IN BED AND MOVING LEGS UP AND DOWN  FAILURE TO FOLLOW THESE INSTRUCTIONS MAY RESULT IN THE CANCELLATION OF YOUR SURGERY. PLEASE BE AWARE THAT YOU MAY NEED ADDITIONAL BLOOD DRAWN DAY OF YOUR SURGERY  PATIENT SIGNATURE_________________________________  PT NOTIFIED OF SURGERY TIME CHANGE TO 11:30 AM AND SHE SHOULD ARRIVE TO SHORT STAY TOMORROW AT 9:00 AM.

## 2013-03-26 NOTE — Pre-Procedure Instructions (Signed)
CXR WAS DONE TODAY - PREOP

## 2013-03-30 ENCOUNTER — Telehealth: Payer: Self-pay | Admitting: *Deleted

## 2013-03-30 NOTE — Telephone Encounter (Signed)
Call to pt with reminder clear liquids today and NPO after midnight. Pt verbalized understanding, no concerns at this time.

## 2013-03-31 ENCOUNTER — Encounter (HOSPITAL_COMMUNITY)
Admission: RE | Disposition: A | Discharge status: Home / Self Care | Payer: Self-pay | Source: Ambulatory Visit | Attending: Obstetrics & Gynecology

## 2013-03-31 ENCOUNTER — Encounter (HOSPITAL_COMMUNITY): Payer: Self-pay | Admitting: Anesthesiology

## 2013-03-31 ENCOUNTER — Ambulatory Visit (HOSPITAL_COMMUNITY): Payer: No Typology Code available for payment source | Admitting: Anesthesiology

## 2013-03-31 ENCOUNTER — Ambulatory Visit (HOSPITAL_COMMUNITY)
Admission: RE | Admit: 2013-03-31 | Discharge: 2013-04-01 | Disposition: A | Discharge status: Home / Self Care | Payer: No Typology Code available for payment source | Source: Ambulatory Visit | Attending: Obstetrics & Gynecology | Admitting: Obstetrics & Gynecology

## 2013-03-31 DIAGNOSIS — C541 Malignant neoplasm of endometrium: Secondary | ICD-10-CM

## 2013-03-31 DIAGNOSIS — E669 Obesity, unspecified: Secondary | ICD-10-CM | POA: Insufficient documentation

## 2013-03-31 DIAGNOSIS — F3289 Other specified depressive episodes: Secondary | ICD-10-CM | POA: Insufficient documentation

## 2013-03-31 DIAGNOSIS — C55 Malignant neoplasm of uterus, part unspecified: Secondary | ICD-10-CM | POA: Diagnosis present

## 2013-03-31 DIAGNOSIS — C549 Malignant neoplasm of corpus uteri, unspecified: Secondary | ICD-10-CM | POA: Insufficient documentation

## 2013-03-31 DIAGNOSIS — F17200 Nicotine dependence, unspecified, uncomplicated: Secondary | ICD-10-CM | POA: Insufficient documentation

## 2013-03-31 DIAGNOSIS — Z9851 Tubal ligation status: Secondary | ICD-10-CM | POA: Insufficient documentation

## 2013-03-31 DIAGNOSIS — D649 Anemia, unspecified: Secondary | ICD-10-CM | POA: Insufficient documentation

## 2013-03-31 DIAGNOSIS — Z79899 Other long term (current) drug therapy: Secondary | ICD-10-CM | POA: Insufficient documentation

## 2013-03-31 DIAGNOSIS — Z90710 Acquired absence of both cervix and uterus: Secondary | ICD-10-CM | POA: Insufficient documentation

## 2013-03-31 DIAGNOSIS — Z8542 Personal history of malignant neoplasm of other parts of uterus: Secondary | ICD-10-CM | POA: Diagnosis present

## 2013-03-31 DIAGNOSIS — N920 Excessive and frequent menstruation with regular cycle: Secondary | ICD-10-CM | POA: Insufficient documentation

## 2013-03-31 DIAGNOSIS — F329 Major depressive disorder, single episode, unspecified: Secondary | ICD-10-CM | POA: Insufficient documentation

## 2013-03-31 HISTORY — PX: ROBOTIC ASSISTED TOTAL HYSTERECTOMY WITH BILATERAL SALPINGO OOPHERECTOMY: SHX6086

## 2013-03-31 LAB — ABO/RH: ABO/RH(D): A NEG

## 2013-03-31 LAB — TYPE AND SCREEN
ABO/RH(D): A NEG
ANTIBODY SCREEN: NEGATIVE

## 2013-03-31 SURGERY — ROBOTIC ASSISTED TOTAL HYSTERECTOMY WITH BILATERAL SALPINGO OOPHORECTOMY
Anesthesia: General | Laterality: Bilateral

## 2013-03-31 MED ORDER — ONDANSETRON HCL 4 MG PO TABS: 4.0000 mg | ORAL_TABLET | Freq: Four times a day (QID) | ORAL | Status: DC | PRN

## 2013-03-31 MED ORDER — GLYCOPYRROLATE 0.2 MG/ML IJ SOLN
INTRAMUSCULAR | Status: DC | PRN
  Administered 2013-03-31: .8 mg via INTRAVENOUS

## 2013-03-31 MED ORDER — OXYCODONE-ACETAMINOPHEN 5-325 MG PO TABS
1.0000 | ORAL_TABLET | ORAL | Status: DC | PRN
  Administered 2013-03-31 – 2013-04-01 (×3): 2 via ORAL
  Filled 2013-03-31 (×3): qty 2

## 2013-03-31 MED ORDER — KCL IN DEXTROSE-NACL 20-5-0.45 MEQ/L-%-% IV SOLN
INTRAVENOUS | Status: DC
  Administered 2013-03-31: 20:00:00 via INTRAVENOUS
  Administered 2013-04-01: 75 mL/h via INTRAVENOUS
  Filled 2013-03-31 (×3): qty 1000

## 2013-03-31 MED ORDER — PROMETHAZINE HCL 25 MG/ML IJ SOLN: 6.2500 mg | INTRAMUSCULAR | Status: DC | PRN

## 2013-03-31 MED ORDER — NEOSTIGMINE METHYLSULFATE 1 MG/ML IJ SOLN
INTRAMUSCULAR | Status: AC
  Filled 2013-03-31: qty 10

## 2013-03-31 MED ORDER — GLYCOPYRROLATE 0.2 MG/ML IJ SOLN
INTRAMUSCULAR | Status: AC
  Filled 2013-03-31: qty 4

## 2013-03-31 MED ORDER — LIDOCAINE HCL (CARDIAC) 20 MG/ML IV SOLN
INTRAVENOUS | Status: DC | PRN
  Administered 2013-03-31: 50 mg via INTRAVENOUS

## 2013-03-31 MED ORDER — STERILE WATER FOR IRRIGATION IR SOLN
Status: DC | PRN
  Administered 2013-03-31: 1000 mL

## 2013-03-31 MED ORDER — KETOROLAC TROMETHAMINE 30 MG/ML IJ SOLN
15.0000 mg | Freq: Four times a day (QID) | INTRAMUSCULAR | Status: DC
  Administered 2013-03-31 – 2013-04-01 (×3): 15 mg via INTRAVENOUS
  Filled 2013-03-31 (×4): qty 1

## 2013-03-31 MED ORDER — MIDAZOLAM HCL 2 MG/2ML IJ SOLN
0.5000 mg | INTRAMUSCULAR | Status: DC
  Administered 2013-03-31: 0.5 mg via INTRAVENOUS

## 2013-03-31 MED ORDER — LACTATED RINGERS IV SOLN
INTRAVENOUS | Status: DC | PRN
  Administered 2013-03-31 (×2): via INTRAVENOUS

## 2013-03-31 MED ORDER — DEXAMETHASONE SODIUM PHOSPHATE 10 MG/ML IJ SOLN
INTRAMUSCULAR | Status: DC | PRN
  Administered 2013-03-31: 10 mg via INTRAVENOUS

## 2013-03-31 MED ORDER — PROPOFOL 10 MG/ML IV BOLUS
INTRAVENOUS | Status: AC
  Filled 2013-03-31: qty 20

## 2013-03-31 MED ORDER — ONDANSETRON HCL 4 MG/2ML IJ SOLN
4.0000 mg | Freq: Four times a day (QID) | INTRAMUSCULAR | Status: DC | PRN
  Administered 2013-03-31: 4 mg via INTRAVENOUS
  Filled 2013-03-31: qty 2

## 2013-03-31 MED ORDER — DEXAMETHASONE SODIUM PHOSPHATE 10 MG/ML IJ SOLN
INTRAMUSCULAR | Status: AC
  Filled 2013-03-31: qty 1

## 2013-03-31 MED ORDER — HYDROMORPHONE HCL PF 1 MG/ML IJ SOLN
0.2500 mg | INTRAMUSCULAR | Status: DC | PRN
  Administered 2013-03-31 (×4): 0.5 mg via INTRAVENOUS

## 2013-03-31 MED ORDER — EPHEDRINE SULFATE 50 MG/ML IJ SOLN
INTRAMUSCULAR | Status: DC | PRN
  Administered 2013-03-31: 15 mg via INTRAVENOUS

## 2013-03-31 MED ORDER — SUCCINYLCHOLINE CHLORIDE 20 MG/ML IJ SOLN
INTRAMUSCULAR | Status: DC | PRN
  Administered 2013-03-31: 140 mg via INTRAVENOUS

## 2013-03-31 MED ORDER — SCOPOLAMINE 1 MG/3DAYS TD PT72
MEDICATED_PATCH | TRANSDERMAL | Status: AC
  Filled 2013-03-31: qty 1

## 2013-03-31 MED ORDER — HYDROMORPHONE HCL PF 1 MG/ML IJ SOLN
INTRAMUSCULAR | Status: AC
  Filled 2013-03-31: qty 1

## 2013-03-31 MED ORDER — FENTANYL CITRATE 0.05 MG/ML IJ SOLN
INTRAMUSCULAR | Status: DC | PRN
  Administered 2013-03-31 (×2): 100 ug via INTRAVENOUS
  Administered 2013-03-31 (×3): 50 ug via INTRAVENOUS

## 2013-03-31 MED ORDER — MIDAZOLAM HCL 5 MG/5ML IJ SOLN
INTRAMUSCULAR | Status: DC | PRN
  Administered 2013-03-31: 2 mg via INTRAVENOUS

## 2013-03-31 MED ORDER — AMITRIPTYLINE HCL 50 MG PO TABS
50.0000 mg | ORAL_TABLET | Freq: Every day | ORAL | Status: DC
  Administered 2013-03-31: 50 mg via ORAL
  Filled 2013-03-31 (×2): qty 1

## 2013-03-31 MED ORDER — PROPOFOL 10 MG/ML IV BOLUS
INTRAVENOUS | Status: DC | PRN
  Administered 2013-03-31: 200 mg via INTRAVENOUS

## 2013-03-31 MED ORDER — LACTATED RINGERS IV SOLN: INTRAVENOUS | Status: DC

## 2013-03-31 MED ORDER — LACTATED RINGERS IR SOLN
Status: DC | PRN
  Administered 2013-03-31: 200 mL

## 2013-03-31 MED ORDER — SCOPOLAMINE 1 MG/3DAYS TD PT72
MEDICATED_PATCH | TRANSDERMAL | Status: DC | PRN
  Administered 2013-03-31: 1 via TRANSDERMAL

## 2013-03-31 MED ORDER — NEOSTIGMINE METHYLSULFATE 1 MG/ML IJ SOLN
INTRAMUSCULAR | Status: DC | PRN
  Administered 2013-03-31: 5 mg via INTRAVENOUS

## 2013-03-31 MED ORDER — CISATRACURIUM BESYLATE (PF) 10 MG/5ML IV SOLN
INTRAVENOUS | Status: DC | PRN
  Administered 2013-03-31 (×2): 4 mg via INTRAVENOUS
  Administered 2013-03-31: 6 mg via INTRAVENOUS
  Administered 2013-03-31: 2 mg via INTRAVENOUS

## 2013-03-31 MED ORDER — CEFAZOLIN SODIUM-DEXTROSE 2-3 GM-% IV SOLR
2.0000 g | INTRAVENOUS | Status: AC
  Administered 2013-03-31: 2 g via INTRAVENOUS

## 2013-03-31 MED ORDER — ALPRAZOLAM 1 MG PO TABS: 1.0000 mg | ORAL_TABLET | Freq: Three times a day (TID) | ORAL | Status: DC | PRN

## 2013-03-31 MED ORDER — LACTATED RINGERS IV SOLN
INTRAVENOUS | Status: DC
  Administered 2013-03-31: 15:00:00 via INTRAVENOUS

## 2013-03-31 MED ORDER — ENOXAPARIN SODIUM 40 MG/0.4ML ~~LOC~~ SOLN
40.0000 mg | SUBCUTANEOUS | Status: AC
  Administered 2013-03-31: 40 mg via SUBCUTANEOUS
  Filled 2013-03-31: qty 0.4

## 2013-03-31 MED ORDER — CISATRACURIUM BESYLATE 20 MG/10ML IV SOLN
INTRAVENOUS | Status: AC
  Filled 2013-03-31: qty 10

## 2013-03-31 MED ORDER — HYDROMORPHONE HCL PF 1 MG/ML IJ SOLN
0.5000 mg | INTRAMUSCULAR | Status: DC | PRN
  Administered 2013-03-31 (×2): 0.5 mg via INTRAVENOUS
  Filled 2013-03-31 (×2): qty 1

## 2013-03-31 MED ORDER — CEFAZOLIN SODIUM-DEXTROSE 2-3 GM-% IV SOLR
INTRAVENOUS | Status: AC
  Filled 2013-03-31: qty 50

## 2013-03-31 MED ORDER — MIDAZOLAM HCL 2 MG/2ML IJ SOLN
INTRAMUSCULAR | Status: AC
  Filled 2013-03-31: qty 2

## 2013-03-31 MED ORDER — ONDANSETRON HCL 4 MG/2ML IJ SOLN
INTRAMUSCULAR | Status: AC
  Filled 2013-03-31: qty 2

## 2013-03-31 MED ORDER — KETOROLAC TROMETHAMINE 30 MG/ML IJ SOLN
15.0000 mg | Freq: Four times a day (QID) | INTRAMUSCULAR | Status: DC
  Filled 2013-03-31 (×4): qty 1

## 2013-03-31 MED ORDER — ONDANSETRON HCL 4 MG/2ML IJ SOLN
INTRAMUSCULAR | Status: DC | PRN
  Administered 2013-03-31: 4 mg via INTRAVENOUS

## 2013-03-31 MED ORDER — MEPERIDINE HCL 50 MG/ML IJ SOLN: 6.2500 mg | INTRAMUSCULAR | Status: DC | PRN

## 2013-03-31 MED ORDER — FENTANYL CITRATE 0.05 MG/ML IJ SOLN
INTRAMUSCULAR | Status: AC
  Filled 2013-03-31: qty 5

## 2013-03-31 MED ORDER — FENTANYL CITRATE 0.05 MG/ML IJ SOLN
INTRAMUSCULAR | Status: AC
  Filled 2013-03-31: qty 2

## 2013-03-31 SURGICAL SUPPLY — 58 items
BENZOIN TINCTURE PRP APPL 2/3 (GAUZE/BANDAGES/DRESSINGS) ×3 IMPLANT
CABLE HIGH FREQUENCY MONO STRZ (ELECTRODE) ×3 IMPLANT
CHLORAPREP W/TINT 26ML (MISCELLANEOUS) ×3 IMPLANT
CLOSURE WOUND 1/2 X4 (GAUZE/BANDAGES/DRESSINGS) ×1
CORDS BIPOLAR (ELECTRODE) ×3 IMPLANT
COVER MAYO STAND STRL (DRAPES) ×3 IMPLANT
COVER SURGICAL LIGHT HANDLE (MISCELLANEOUS) ×3 IMPLANT
COVER TIP SHEARS 8 DVNC (MISCELLANEOUS) ×1 IMPLANT
COVER TIP SHEARS 8MM DA VINCI (MISCELLANEOUS) ×2
DRAPE LG THREE QUARTER DISP (DRAPES) ×9 IMPLANT
DRAPE SURG IRRIG POUCH 19X23 (DRAPES) ×3 IMPLANT
DRAPE TABLE BACK 44X90 PK DISP (DRAPES) IMPLANT
DRAPE UTILITY XL STRL (DRAPES) ×3 IMPLANT
DRAPE WARM FLUID 44X44 (DRAPE) ×3 IMPLANT
DRSG TEGADERM 2-3/8X2-3/4 SM (GAUZE/BANDAGES/DRESSINGS) ×9 IMPLANT
DRSG TEGADERM 4X4.75 (GAUZE/BANDAGES/DRESSINGS) ×3 IMPLANT
DRSG TEGADERM 6X8 (GAUZE/BANDAGES/DRESSINGS) ×6 IMPLANT
ELECT REM PT RETURN 9FT ADLT (ELECTROSURGICAL) ×3
ELECTRODE REM PT RTRN 9FT ADLT (ELECTROSURGICAL) ×1 IMPLANT
GAUZE SPONGE 2X2 8PLY STRL LF (GAUZE/BANDAGES/DRESSINGS) ×1 IMPLANT
GLOVE BIO SURGEON STRL SZ 6.5 (GLOVE) ×10 IMPLANT
GLOVE BIO SURGEON STRL SZ7.5 (GLOVE) IMPLANT
GLOVE BIO SURGEONS STRL SZ 6.5 (GLOVE) ×5
GLOVE BIOGEL PI IND STRL 7.0 (GLOVE) ×2 IMPLANT
GLOVE BIOGEL PI INDICATOR 7.0 (GLOVE) ×4
GOWN STRL REUS W/ TWL XL LVL3 (GOWN DISPOSABLE) ×2 IMPLANT
GOWN STRL REUS W/TWL LRG LVL3 (GOWN DISPOSABLE) ×9 IMPLANT
GOWN STRL REUS W/TWL XL LVL3 (GOWN DISPOSABLE) ×4
HOLDER FOLEY CATH W/STRAP (MISCELLANEOUS) ×3 IMPLANT
KIT ACCESSORY DA VINCI DISP (KITS) ×2
KIT ACCESSORY DVNC DISP (KITS) ×1 IMPLANT
KIT BASIN OR (CUSTOM PROCEDURE TRAY) ×3 IMPLANT
MANIPULATOR UTERINE 4.5 ZUMI (MISCELLANEOUS) IMPLANT
OCCLUDER COLPOPNEUMO (BALLOONS) IMPLANT
POUCH SPECIMEN RETRIEVAL 10MM (ENDOMECHANICALS) ×12 IMPLANT
SET TUBE IRRIG SUCTION NO TIP (IRRIGATION / IRRIGATOR) ×3 IMPLANT
SHEET LAVH (DRAPES) ×3 IMPLANT
SOLUTION ANTI FOG 6CC (MISCELLANEOUS) ×3 IMPLANT
SOLUTION ELECTROLUBE (MISCELLANEOUS) ×3 IMPLANT
SPONGE GAUZE 2X2 STER 10/PKG (GAUZE/BANDAGES/DRESSINGS) ×2
SPONGE LAP 18X18 X RAY DECT (DISPOSABLE) IMPLANT
STRIP CLOSURE SKIN 1/2X4 (GAUZE/BANDAGES/DRESSINGS) ×2 IMPLANT
SUT VIC AB 0 CT1 27 (SUTURE) ×6
SUT VIC AB 0 CT1 27XBRD ANTBC (SUTURE) ×3 IMPLANT
SUT VIC AB 4-0 PS2 27 (SUTURE) ×6 IMPLANT
SUT VICRYL 0 UR6 27IN ABS (SUTURE) ×3 IMPLANT
SYR 50ML LL SCALE MARK (SYRINGE) ×3 IMPLANT
SYR BULB IRRIGATION 50ML (SYRINGE) IMPLANT
TOWEL OR 17X26 10 PK STRL BLUE (TOWEL DISPOSABLE) ×6 IMPLANT
TOWEL OR NON WOVEN STRL DISP B (DISPOSABLE) ×3 IMPLANT
TRAP SPECIMEN MUCOUS 40CC (MISCELLANEOUS) IMPLANT
TRAY FOLEY CATH 14FRSI W/METER (CATHETERS) ×3 IMPLANT
TRAY LAP CHOLE (CUSTOM PROCEDURE TRAY) ×3 IMPLANT
TROCAR 12M 150ML BLUNT (TROCAR) ×3 IMPLANT
TROCAR BLADELESS OPT 5 100 (ENDOMECHANICALS) ×3 IMPLANT
TROCAR XCEL 12X100 BLDLESS (ENDOMECHANICALS) ×3 IMPLANT
TUBING INSUFFLATION 10FT LAP (TUBING) ×3 IMPLANT
WATER STERILE IRR 1500ML POUR (IV SOLUTION) ×6 IMPLANT

## 2013-03-31 NOTE — H&P (View-Only) (Signed)
Consult Note: Gyn-Onc  Consult was requested by Dr. Anyanwufor the evaluation of Sheryl Mathis 42 y.o. female  CC:  Chief Complaint  Patient presents with  . Endo ca    Follow up     Assessment/Plan:  Sheryl Mathis  is a 42 y.o.  year old status post laparoscopic hysterectomy bilateral salpingectomy for abnormal uterine bleeding with 2 prior endometrial sampling that were negative for malignancy. The specimen was morcellated into 4 pieces to facilitate removal from the vagina. 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 Of note only one fallopian tube was present in the final pathology.  CT imaging obtained in December of 2050 was unremarkable for evidence of intraperitoneal disease or adenopathy.  Given the extensive angiolymphatic invasion I've recommended minimally invasive robotic bilateral oophorectomy pelvic and possible periaortic lymph node dissection to better clarify the appropriate adjuvant therapy.   The risks of the procedure discussed were that of infection bleeding damage to surrounding structures nerve damage visual changes facial edema prolonged hospitalization and reoperation.  The patient understands that the procedure will be completed by Dr. Paula Gehrig on 03/31/2013.    HPI: Sheryl Mathis  is a 42 y.o.G2 P2 last normal menstrual period in 11/03/2012. She reports abnormal uterine bleeding that began approximately 6 months ago. It became very heavy in September 2014 when she presented for evaluation and was initially placed on Megace.  12/01/12 ECC - BENIGN ENDOCERVICAL MUCOSA, - DETACHED FRAGMENTS OF SQUAMOUS EPITHELIUM; NEGATIVE FOR INTRAEPITHELIAL LESION OR MALIGANNCY. Endometrium, biopsy - FRAGMENTS OF ENDOMETRIOID-TYPE POLYP(S) WITH DECIDUALIZATION OF THE STROMA, CONSISTENT WITH HORMONE EFFECT. - THERE IS NO EVIDENCE OF HYPERPLASIA OR MALIGNANCY. Repeat biopsy on  01/02/2013 Endometrium, curettage - FRAGMENTS SUGGESTIVE OF ENDOMETRIOID TYPE POLYP(S) WITH DECIDUALIZATION OF THE STROMA, CONSISTENT WITH HORMONE EFFECT. - BENIGN ENDOCERVICAL TYPE MUCOSA. - THERE IS NO EVIDENCE OF HYPERPLASIA OR MALIGNANCY.  On 01/22/2013 she underwent a total laparoscopic hysterectomy unilateral salpingectomy with "some  vaginal morcellation to deliver the uterus vaginally after the hysterectomy was completed"  Uterus and bilateral fallopian tubes, with cervix ( 4 sections) Specimen: Uterus, cervix, one fallopian tube Procedure: Hysterectomy and salpingectomy Lymph node sampling performed: No Specimen integrity: Fragmented, please see gross description for details. Maximum tumor size: Estimated size 6.5 cm, grossly Histologic type: Invasive endometrioid carcinoma Grade: FIGO Grade II Myometrial invasion: 0.9 cm where myometrium is 2.4 cm in thickness Cervical stromal involvement: No Lymph - vascular invasion: Present, extensively FIGO Stage (based on pathologic findings, needs clinical correlation): At least IA Comment:  Sections show an invasive endometrioid carcinoma involving the upper portion of the uterus, estimated 6.5 cm in greatest dimension. There is extensive angiolymphatic invasion present. IHC Expression Result: MLH1: Preserved nuclear expression (greater 50% tumor expression) MSH2: Preserved nuclear expression (greater 50% tumor expression) MSH6: Preserved nuclear expression (greater 50% tumor expression) PMS2: Preserved nuclear expression (greater 50% tumor expression) * Internal control demonstrates intact nuclear expression  [name] feels well she denies vaginal bleeding she states that her appetite is good there is some fatigue she denies weight loss or been no changes in her bowel or bladder routines. She denies a family history of malignancy.  Discussion with Dr. Anyanwu confirmed that there was vaginal not intra-abdominal morcellation and that both  fallopian tubes were removed, although only one was in the pathology specimen.  CT 12/17/2015to assess for nodal or intraperioneal disease notable for no retroperitoneal or retrocrural adenopathy.   Normal colon, appendix, and terminal ileum. Normal small bowel without abdominal ascites. No evidence of omental  or peritoneal disease. No pelvic adenopathy. Residual normal appearing left ovary on image 72 and right ovary on image 76. No adnexal mass. There is mild pelvic fascia thickening and trace fluid which is presumably postoperative.    Current Meds:  Outpatient Encounter Prescriptions as of 03/12/2013  Medication Sig  . ALPRAZolam (XANAX) 1 MG tablet Take 1 tablet (1 mg total) by mouth 3 (three) times daily as needed for anxiety.  . amitriptyline (ELAVIL) 50 MG tablet Take 1 tablet (50 mg total) by mouth at bedtime.  . butalbital-acetaminophen-caffeine (FIORICET) 50-325-40 MG per tablet Take 1-2 tablets by mouth every 6 (six) hours as needed for headache.  . docusate sodium (COLACE) 100 MG capsule Take 1 capsule (100 mg total) by mouth 2 (two) times daily as needed for constipation.  . Ferrous Sulfate (IRON) 28 MG TABS Take 1 tablet (28 mg total) by mouth 2 (two) times daily. 256 mg of ferrous gluconate  . ibuprofen (ADVIL,MOTRIN) 600 MG tablet Take 1 tablet (600 mg total) by mouth every 6 (six) hours as needed.  . LORazepam (ATIVAN) 1 MG tablet Take 1 tablet (1 mg total) by mouth every 6 (six) hours as needed for anxiety.  . metoCLOPramide (REGLAN) 10 MG tablet Take 1 tablet (10 mg total) by mouth every 6 (six) hours as needed for nausea or vomiting.  . oxyCODONE-acetaminophen (PERCOCET/ROXICET) 5-325 MG per tablet Take 1-2 tablets by mouth every 4 (four) hours as needed for pain.    Allergy: No Known Allergies  Social Hx:   History   Social History  . Marital Status: Legally Separated    Spouse Name: N/A    Number of Children: N/A  . Years of Education: N/A   Occupational History  .  Not on file.   Social History Main Topics  . Smoking status: Current Every Day Smoker -- 0.50 packs/day    Types: Cigarettes  . Smokeless tobacco: Not on file  . Alcohol Use: No  . Drug Use: No  . Sexual Activity: Not Currently    Birth Control/ Protection: Surgical   Other Topics Concern  . Not on file   Social History Narrative  . No narrative on file    Past Surgical Hx:  Past Surgical History  Procedure Laterality Date  . Cesarean section      X2  . Tubal ligation    . Dilitation & currettage/hystroscopy with hydrothermal ablation N/A 01/01/2013    Procedure: DILATATION & CURETTAGE/HYSTEROSCOPY WITH ATTEMPTED HYDROTHERMAL ABLATION: Change over to THERMACHOICE ABLATION @ 1505;  Surgeon: Ugonna A Anyanwu, MD;  Location: [facility];  Service: Gynecology;  Laterality: N/A;  . Laparoscopic assisted vaginal hysterectomy N/A 01/22/2013    Procedure: LAPAROSCOPIC ASSISTED VAGINAL HYSTERECTOMY with Bilateral Fallopian Tubes and Ovaries;  Surgeon: Ugonna A Anyanwu, MD;  Location: [facility];  Service: Gynecology;  Laterality: N/A;    Past Medical Hx:  Past Medical History  Diagnosis Date  . Anemia   . Menorrhagia   . Headache(784.0)   . Depression     Past Gynecological History: Gravida 2 per 2 menarche at age 15 with regular menses until 6 months ago. Reports oral contraceptive pill use for 14 years in the remote past Patient's last menstrual period was 11/03/2012.  Family Hx:  Family History  Problem Relation Age of Onset  . Diabetes Mother     Review of Systems:  Constitutional  Feels   well,  Cardiovascular  No chest pain, shortness of breath, or edema  Pulmonary  No cough or wheeze.  Gastro Intestinal  No nausea, vomitting, or diarrhoea. No bright red blood per rectum, no abdominal pain, change in bowel movement, or constipation.  Genito Urinary  No frequency, urgency, dysuria Musculo Skeletal  No myalgia, arthralgia, joint swelling or pain  Neurologic  No weakness,  numbness, change in gait,  Psychology  No depression, anxiety, insomnia.   Vitals:  Blood pressure 146/85, pulse 78, temperature 98.6 F (37 C), temperature source Oral, weight 253 lb 9.6 oz (115.032 kg), last menstrual period 11/03/2012, SpO2 99.00%.  Physical Exam: WD in NAD Neck  Supple NROM, without any enlargements.  Lymph Node Survey No cervical supraclavicular or inguinal adenopathy Cardiovascular  Pulse normal rate, regularity and rhythm. Lungs  Clear to auscultation bilateraly. Good air movement.  Skin  No rash/lesions/breakdown  Psychiatry  Alert and oriented to person, place, and time  Abdomen  Normoactive bowel sounds, abdomen soft, non-tender and obese. LSC surgical  sites intact without evidence of hernia or masses.  Back No CVA tenderness Genito Urinary  Vulva/vagina: Normal external female genitalia.  No lesions. No discharge or bleeding.  Bladder/urethra:  No lesions or masses  Vagina:No bleeding or discharge,   Adnexa: No palpable masses. Rectal  Good tone, no masses no cul de sac nodularity.  Extremities  No bilateral cyanosis, clubbing or edema.   Senia Even, MD, PhD 03/12/2013, 5:54 PM    

## 2013-03-31 NOTE — Anesthesia Preprocedure Evaluation (Signed)
Anesthesia Evaluation  Patient identified by MRN, date of birth, ID band Patient awake    Reviewed: Allergy & Precautions, H&P , NPO status , Patient's Chart, lab work & pertinent test results  Airway Mallampati: II TM Distance: >3 FB Neck ROM: Full    Dental no notable dental hx.    Pulmonary Current Smoker,  breath sounds clear to auscultation  Pulmonary exam normal       Cardiovascular negative cardio ROS  Rhythm:Regular Rate:Normal     Neuro/Psych negative neurological ROS  negative psych ROS   GI/Hepatic negative GI ROS, Neg liver ROS,   Endo/Other  negative endocrine ROSMorbid obesity  Renal/GU negative Renal ROS  negative genitourinary   Musculoskeletal negative musculoskeletal ROS (+)   Abdominal   Peds negative pediatric ROS (+)  Hematology   Anesthesia Other Findings   Reproductive/Obstetrics negative OB ROS                           Anesthesia Physical Anesthesia Plan  ASA: III  Anesthesia Plan: General   Post-op Pain Management:    Induction: Intravenous  Airway Management Planned: Oral ETT  Additional Equipment:   Intra-op Plan:   Post-operative Plan: Extubation in OR  Informed Consent: I have reviewed the patients History and Physical, chart, labs and discussed the procedure including the risks, benefits and alternatives for the proposed anesthesia with the patient or authorized representative who has indicated his/her understanding and acceptance.   Dental advisory given  Plan Discussed with: CRNA  Anesthesia Plan Comments:         Anesthesia Quick Evaluation

## 2013-03-31 NOTE — Transfer of Care (Signed)
Immediate Anesthesia Transfer of Care Note  Patient: Sheryl Mathis  Procedure(s) Performed: Procedure(s): ROBOTIC ASSISTED BILATERAL OOPHORECTOMY PELVIC AND POSSIBLE PARA-AORTIC LYMPH NODE DISSECTION (Bilateral)  Patient Location: PACU  Anesthesia Type:General  Level of Consciousness: awake, alert  and oriented  Airway & Oxygen Therapy: Patient Spontanous Breathing and Patient connected to face mask oxygen  Post-op Assessment: Report given to PACU RN and Post -op Vital signs reviewed and stable  Post vital signs: Reviewed and stable  Complications: No apparent anesthesia complications

## 2013-03-31 NOTE — Interval H&P Note (Signed)
History and Physical Interval Note:  03/31/2013 11:42 AM  Sheryl Mathis  has presented today for surgery, with the diagnosis of ENDOMETRIAL CANCER  The various methods of treatment have been discussed with the patient and family. After consideration of risks, benefits and other options for treatment, the patient has consented to  Procedure(s): ROBOTIC ASSISTED BILATERAL OOPHORECTOMY PELVIC AND POSSIBLE PARA-AORTIC LYMPH NODE DISSECTION (Bilateral) as a surgical intervention .  The patient's history has been reviewed, patient examined, no change in status, stable for surgery.  I have reviewed the patient's chart and labs.  Questions were answered to the patient's satisfaction.     [place] A.

## 2013-03-31 NOTE — Preoperative (Signed)
Beta Blockers   Reason not to administer Beta Blockers:Not Applicable 

## 2013-03-31 NOTE — Op Note (Signed)
PATIENT: Sheryl Mathis DATE OF BIRTH: 12-07-1970 ENCOUNTER DATE: 03/31/2013   Preop Diagnosis: grade 2 endometrioid adenocarcinoma s/p hysterectomy, requiring oophorectomy and surgical staging  Postoperative Diagnosis: same.   Surgery: Robotic bilateral oophorectomy, left salpingectomy, bilateral pelvic lymph node dissection, right PA node sampling  Surgeons:  Vraj Denardo A. Alycia Rossetti, MD; Lahoma Crocker, MD   Anesthesia: General   Estimated blood loss: 100  ml   IVF: 1700 ml   Urine output: 258 ml   Complications: None   Pathology: Bilateral ovaries, left fallopian tube,  Right para-aortic lymph nodes, bilateral pelvic lymph nodes to pathology.   Operative findings: Status post hysterectomy with mild adhesive disease of the rectosigmoid colon to the left adnexa. Significant intra-abdominal obesity. Prominent bilateral pelvic lymph nodes.  Procedure: The patient was identified in the preoperative holding area. Informed consent was signed on the chart. Patient was seen history was reviewed and exam was performed.   The patient was then taken to the operating room and placed in the supine position with SCD hose on. She was then placed in the dorsolithotomy position. Her arms were tucked at her side with appropriate precautions on the gel pad. General anesthesia was then induced without difficulty. Shoulder blocks were then placed in the usual fashion with appropriate precautions. A OG-tube was placed to suction. First timeout was performed to confirm the patient, procedure, antibiotic, allergy status, estimated blood loss and OR time. The perineum was then prepped in the usual fashion with Betadine. A 14 French Foley was inserted into the bladder under sterile conditions.The abdomen was then prepped with 2 Chlor prep sponges per protocol.   Patient was then draped after the prep was dried. Second timeout was performed to confirm the above. After again confirming OG tube placement and it was to  suction. A stab-wound was made in left upper quadrant 2 cm below the costal margin on the left in the midclavicular line. A 5 mm operative report was used to assure intra-abdominal placement. The abdomen was insufflated. At this point all points during the procedure the patient's intra-abdominal pressure was not increased over 15 mm of mercury. After insufflation was complete, the patient was placed in deep Trendelenburg position. 25 cm above the pubic symphysis that area was marked the camera port. Bilateral robotic ports were marked 10 cm from the midline incision at approximately 5 angle. Fourth arm was placed on the left 2 cm superior and medial to the ASIS. Under direct visualization each of the trochars was placed into the abdomen. The small bowel was folded on its mesentery to allow visualization to the pelvis. The 5 mm LUQ port was then converted to a 10/12 port under direct visualization.  After assuring adequate visualization, the robot was then docked in the usual fashion. Under direct visualization the robotic instruments replaced.   The adhesive disease of the rectosigmoid colon was sharply dissected off the left adnexa. The round ligament on the patient's left side was transected with monopolar cautery. The anterior and posterior leaves of the broad ligament were then taken down in the usual fashion. The paravesical and pararectal spaces were opened. The ureter was identified on the medial leaf of the broad ligament. A window was made between the IP and the ureter. The IP was coagulated with bipolar cautery and transected. The left ovary was then transected from its attachments near the round ligament and placed an Endo catch bag. Similar procedure was performed on the patient's right side.  Our attention was then drawn  to completely opening the paravesical space on her right side the perirectal space was also opened. The obturator nerve was identified. The nodal bundle extending over the external  iliac artery down to the external iliac vein was taken down using sharp dissection and monopolar cautery. The genitofemoral nerve was identified and spared. We continued our dissection down to the level of the circumflex iliac vein. We then re-identified the obturator nerve,  and the nodal bundle superior to the obturator nerve was taken. All pedicles were noted to be hemostatic the ureter was noted to be well medial of the area of dissection. The nodal bundle was then placed and an Endo catch bag. A similar procedure was performed on the patient's left side.  The peritoneum over the right common iliac artery was opened and extended superiorly towards the duodenum. Visualization was difficult secondary to the patient's body habitus. The ureter was identified in the retroperitoneum and held our of the surgical field with the robotic grasper. The nodal bundle extending over the right side of the aorta to the vena cava was removed without difficulty. Due to visualization were not able to reach the duodenum. There were small perforators which were coagulated with monopolar cautery. The bundle was delivered to the 10/12 port. Due to the patients body habitus and limited visualization, were not able to perform a left para-aortic lymph node dissection.  The robotic instruments were removed under direct visualization as were the robotic trochars. The pneumoperitoneum was removed. The Endo catch bags were delivered through the 10-12 left upper quadrant port. The patient was then taken out of the Trendelenburg position. Using of 0 Vicryl on a UR 6 needle the midline port fascia was closed. The subcutaneous tissues of the port in the left upper quadrant was reapproximated. The skin was closed using 4-0 Vicryl. Steri-Strips and benzoin were applied.    All instrument needle and Ray-Tec counts were correct x2. The patient tolerated the procedure well and was taken to the recovery room in stable condition. This is [name] dictating an operative note on patient Sheryl Mathis.

## 2013-03-31 NOTE — Anesthesia Postprocedure Evaluation (Signed)
  Anesthesia Post-op Note  Patient: Sheryl Mathis  Procedure(s) Performed: Procedure(s) (LRB): ROBOTIC ASSISTED BILATERAL OOPHORECTOMY PELVIC AND POSSIBLE PARA-AORTIC LYMPH NODE DISSECTION (Bilateral)  Patient Location: PACU  Anesthesia Type: General  Level of Consciousness: awake and alert   Airway and Oxygen Therapy: Patient Spontanous Breathing  Post-op Pain: mild  Post-op Assessment: Post-op Vital signs reviewed, Patient's Cardiovascular Status Stable, Respiratory Function Stable, Patent Airway and No signs of Nausea or vomiting  Last Vitals:  Filed Vitals:   03/31/13 1545  BP:   Pulse: 59  Temp:   Resp: 16    Post-op Vital Signs: stable   Complications: No apparent anesthesia complications

## 2013-04-01 ENCOUNTER — Encounter (HOSPITAL_COMMUNITY): Payer: Self-pay | Admitting: Gynecologic Oncology

## 2013-04-01 LAB — CBC
HEMATOCRIT: 35.5 % — AB (ref 36.0–46.0)
Hemoglobin: 11.3 g/dL — ABNORMAL LOW (ref 12.0–15.0)
MCH: 24.1 pg — ABNORMAL LOW (ref 26.0–34.0)
MCHC: 31.8 g/dL (ref 30.0–36.0)
MCV: 75.7 fL — AB (ref 78.0–100.0)
Platelets: 374 10*3/uL (ref 150–400)
RBC: 4.69 MIL/uL (ref 3.87–5.11)
RDW: 15.7 % — ABNORMAL HIGH (ref 11.5–15.5)
WBC: 13.2 10*3/uL — ABNORMAL HIGH (ref 4.0–10.5)

## 2013-04-01 LAB — BASIC METABOLIC PANEL
BUN: 7 mg/dL (ref 6–23)
CHLORIDE: 97 meq/L (ref 96–112)
CO2: 23 mEq/L (ref 19–32)
CREATININE: 0.53 mg/dL (ref 0.50–1.10)
Calcium: 8.7 mg/dL (ref 8.4–10.5)
GFR calc non Af Amer: >90 mL/min (ref >90)
Glucose, Bld: 144 mg/dL — ABNORMAL HIGH (ref 70–99)
Potassium: 4.1 mEq/L (ref 3.7–5.3)
Sodium: 132 mEq/L — ABNORMAL LOW (ref 137–147)

## 2013-04-01 MED ORDER — IBUPROFEN 600 MG PO TABS
600.0000 mg | ORAL_TABLET | Freq: Four times a day (QID) | ORAL | Status: DC
  Administered 2013-04-01: 600 mg via ORAL
  Filled 2013-04-01 (×4): qty 1

## 2013-04-01 MED ORDER — DOCUSATE SODIUM 100 MG PO CAPS
100.0000 mg | ORAL_CAPSULE | Freq: Every day | ORAL | Status: DC
  Administered 2013-04-01: 100 mg via ORAL
  Filled 2013-04-01: qty 1

## 2013-04-01 MED ORDER — MENTHOL 3 MG MT LOZG
1.0000 | LOZENGE | OROMUCOSAL | Status: DC | PRN
  Administered 2013-04-01: 3 mg via ORAL
  Filled 2013-04-01: qty 9

## 2013-04-01 MED ORDER — OXYCODONE-ACETAMINOPHEN 5-325 MG PO TABS: 1.0000 | ORAL_TABLET | ORAL | Status: DC | PRN

## 2013-04-01 MED ORDER — IBUPROFEN 600 MG PO TABS
600.0000 mg | ORAL_TABLET | Freq: Four times a day (QID) | ORAL | Status: DC | PRN
Start: 2013-04-01 — End: 2013-05-20

## 2013-04-01 NOTE — Discharge Summary (Signed)
Physician Discharge Summary  Patient ID: Sheryl Mathis MRN: [id number] DOB/AGE: [birth year] 43 y.o.  Admit date: 03/31/2013 Discharge date: 04/01/2013  Admission Diagnoses: Endometrial carcinoma  Discharge Diagnoses:  Principal Problem:   Endometrial carcinoma Active Problems:   Uterine cancer  Discharged Condition:  The patient is in good condition and stable for discharge.    Hospital Course: On 03/31/2013, the patient underwent the following: Procedure(s): ROBOTIC ASSISTED BILATERAL OOPHORECTOMY PELVIC AND POSSIBLE PARA-AORTIC LYMPH NODE DISSECTION.  The postoperative course was uneventful.  She was discharged to home on postoperative day 1 tolerating a regular diet.  Consults: None  Significant Diagnostic Studies: None  Treatments: surgery: see above  Discharge Exam: Blood pressure 125/61, pulse 65, temperature 98 F (36.7 C), temperature source Oral, resp. rate 18, height 5\' 7"  (1.702 m), weight 269 lb 10 oz (122.3 kg), last menstrual period 11/03/2012, SpO2 98.00%. General appearance: alert, cooperative and no distress Resp: clear to auscultation bilaterally Cardio: regular rate and rhythm, S1, S2 normal, no murmur, click, rub or gallop GI: soft, non-tender; bowel sounds normal; no masses,  no organomegaly Extremities: extremities normal, atraumatic, no cyanosis or edema Incision/Wound: Lap sites with steri strips clean, dry, and intact  Disposition: 01-Home or Self Care      Discharge Orders   Future Appointments Provider Department Dept Phone   04/08/2013 9:30 AM Paola A. Alycia Rossetti, [facility] [phone]   Future Orders Complete By Expires   Call MD for:  difficulty breathing, headache or visual disturbances  As directed    Call MD for:  extreme fatigue  As directed    Call MD for:  hives  As directed    Call MD for:  persistant dizziness or light-headedness  As directed    Call MD for:  persistant nausea and vomiting  As  directed    Call MD for:  redness, tenderness, or signs of infection (pain, swelling, redness, odor or green/yellow discharge around incision site)  As directed    Call MD for:  severe uncontrolled pain  As directed    Call MD for:  temperature >100.4  As directed    Diet - low sodium heart healthy  As directed    Driving Restrictions  As directed    Comments:     No driving for 1 week.  Do not take narcotics and drive.   Increase activity slowly  As directed    Lifting restrictions  As directed    Comments:     No lifting greater than 10 lbs.   Sexual Activity Restrictions  As directed    Comments:     No sexual activity, nothing in the vagina, for 8 weeks.       Medication List         ALPRAZolam 1 MG tablet  Commonly known as:  XANAX  Take 1 tablet (1 mg total) by mouth 3 (three) times daily as needed for anxiety.     amitriptyline 50 MG tablet  Commonly known as:  ELAVIL  Take 1 tablet (50 mg total) by mouth at bedtime.     docusate sodium 100 MG capsule  Commonly known as:  COLACE  Take 1 capsule (100 mg total) by mouth 2 (two) times daily as needed for constipation.     ibuprofen 600 MG tablet  Commonly known as:  ADVIL,MOTRIN  Take 1 tablet (600 mg total) by mouth every 6 (six) hours as needed.     Iron 28 MG  Tabs  Take 1 tablet (28 mg total) by mouth 2 (two) times daily. 256 mg of ferrous gluconate     LORazepam 1 MG tablet  Commonly known as:  ATIVAN  Take 1 mg by mouth every 6 (six) hours as needed for anxiety.     metoCLOPramide 10 MG tablet  Commonly known as:  REGLAN  Take 1 tablet (10 mg total) by mouth every 6 (six) hours as needed for nausea or vomiting.     oxyCODONE-acetaminophen 5-325 MG per tablet  Commonly known as:  PERCOCET/ROXICET  Take 1-2 tablets by mouth every 4 (four) hours as needed for severe pain (moderate to severe pain (when tolerating fluids)).       Follow-up Information   Follow up with [facility] A., MD On 04/08/2013. (at  9:30am at the [facility])    Specialty:  Gynecologic Oncology   Contact information:   [address] [place] [address] [phone]       Greater than thirty minutes were spend for face to face discharge instructions and discharge orders/summary in EPIC.   Signed: CROSS, MELISSA DEAL 04/01/2013, 10:37 AM

## 2013-04-01 NOTE — Discharge Instructions (Signed)
04/01/2013  Return to work: 4 weeks  Activity: 1. Be up and out of the bed during the day.  Take a nap if needed.  You may walk up steps but be careful and use the hand rail.  Stair climbing will tire you more than you think, you may need to stop part way and rest.   2. No lifting or straining for 6 weeks.  3. No driving for 1 week.  Do not drive if you are taking narcotic pain medicine.  4. Shower daily.  Use soap and water on your incision and pat dry; don't rub.   5. No sexual activity and nothing in the vagina for 6-8 weeks.  Diet: 1. Low sodium Heart Healthy Diet is recommended.  2. It is safe to use a laxative if you have difficulty moving your bowels.   Wound Care: 1. Keep clean and dry.  Shower daily.  Reasons to call the Doctor:  Fever - Oral temperature greater than 100.4 degrees Fahrenheit  Foul-smelling vaginal discharge  Difficulty urinating  Nausea and vomiting  Increased pain at the site of the incision that is unrelieved with pain medicine.  Difficulty breathing with or without chest pain  New calf pain especially if only on one side  Sudden, continuing increased vaginal bleeding with or without clots.   Contacts: For questions or concerns you should contact:  Dr. Lahoma Crocker at [phone]  Dr. Alycia Rossetti at [place]  [name], NP at [phone]  Acetaminophen; Oxycodone tablets What is this medicine? ACETAMINOPHEN; OXYCODONE (a set a MEE noe fen; ox i KOE done) is a pain reliever. It is used to treat mild to moderate pain. This medicine may be used for other purposes; ask your health care provider or pharmacist if you have questions. COMMON BRAND NAME(S): Endocet, Magnacet, Narvox, Percocet, Perloxx, Primalev, Primlev, Roxicet, Xolox What should I tell my health care provider before I take this medicine? They need to know if you have any of these conditions: -brain tumor -Crohn's disease, inflammatory bowel disease, or  ulcerative colitis -drug abuse or addiction -head injury -heart or circulation problems -if you often drink alcohol -kidney disease or problems going to the bathroom -liver disease -lung disease, asthma, or breathing problems -an unusual or allergic reaction to acetaminophen, oxycodone, other opioid analgesics, other medicines, foods, dyes, or preservatives -pregnant or trying to get pregnant -breast-feeding How should I use this medicine? Take this medicine by mouth with a full glass of water. Follow the directions on the prescription label. Take your medicine at regular intervals. Do not take your medicine more often than directed. Talk to your pediatrician regarding the use of this medicine in children. Special care may be needed. Patients over 108 years old may have a stronger reaction and need a smaller dose. Overdosage: If you think you have taken too much of this medicine contact a poison control center or emergency room at once. NOTE: This medicine is only for you. Do not share this medicine with others. What if I miss a dose? If you miss a dose, take it as soon as you can. If it is almost time for your next dose, take only that dose. Do not take double or extra doses. What may interact with this medicine? -alcohol -antihistamines -barbiturates like amobarbital, butalbital, butabarbital, methohexital, pentobarbital, phenobarbital, thiopental, and secobarbital -benztropine -drugs for bladder problems like solifenacin, trospium, oxybutynin, tolterodine, hyoscyamine, and methscopolamine -drugs for breathing problems like ipratropium and tiotropium -drugs for certain stomach or intestine problems  like propantheline, homatropine methylbromide, glycopyrrolate, atropine, belladonna, and dicyclomine -general anesthetics like etomidate, ketamine, nitrous oxide, propofol, desflurane, enflurane, halothane, isoflurane, and sevoflurane -medicines for depression, anxiety, or psychotic  disturbances -medicines for sleep -muscle relaxants -naltrexone -narcotic medicines (opiates) for pain -phenothiazines like perphenazine, thioridazine, chlorpromazine, mesoridazine, fluphenazine, prochlorperazine, promazine, and trifluoperazine -scopolamine -tramadol -trihexyphenidyl This list may not describe all possible interactions. Give your health care provider a list of all the medicines, herbs, non-prescription drugs, or dietary supplements you use. Also tell them if you smoke, drink alcohol, or use illegal drugs. Some items may interact with your medicine. What should I watch for while using this medicine? Tell your doctor or health care professional if your pain does not go away, if it gets worse, or if you have new or a different type of pain. You may develop tolerance to the medicine. Tolerance means that you will need a higher dose of the medication for pain relief. Tolerance is normal and is expected if you take this medicine for a long time. Do not suddenly stop taking your medicine because you may develop a severe reaction. Your body becomes used to the medicine. This does NOT mean you are addicted. Addiction is a behavior related to getting and using a drug for a non-medical reason. If you have pain, you have a medical reason to take pain medicine. Your doctor will tell you how much medicine to take. If your doctor wants you to stop the medicine, the dose will be slowly lowered over time to avoid any side effects. You may get drowsy or dizzy. Do not drive, use machinery, or do anything that needs mental alertness until you know how this medicine affects you. Do not stand or sit up quickly, especially if you are an older patient. This reduces the risk of dizzy or fainting spells. Alcohol may interfere with the effect of this medicine. Avoid alcoholic drinks. There are different types of narcotic medicines (opiates) for pain. If you take more than one type at the same time, you may have  more side effects. Give your health care provider a list of all medicines you use. Your doctor will tell you how much medicine to take. Do not take more medicine than directed. Call emergency for help if you have problems breathing. The medicine will cause constipation. Try to have a bowel movement at least every 2 to 3 days. If you do not have a bowel movement for 3 days, call your doctor or health care professional. Do not take Tylenol (acetaminophen) or medicines that have acetaminophen with this medicine. Too much acetaminophen can be very dangerous. Many nonprescription medicines contain acetaminophen. Always read the labels carefully to avoid taking more acetaminophen. What side effects may I notice from receiving this medicine? Side effects that you should report to your doctor or health care professional as soon as possible: -allergic reactions like skin rash, itching or hives, swelling of the face, lips, or tongue -breathing difficulties, wheezing -confusion -light headedness or fainting spells -severe stomach pain -unusually weak or tired -yellowing of the skin or the whites of the eyes  Side effects that usually do not require medical attention (report to your doctor or health care professional if they continue or are bothersome): -dizziness -drowsiness -nausea -vomiting This list may not describe all possible side effects. Call your doctor for medical advice about side effects. You may report side effects to FDA at 1-800-FDA-1088. Where should I keep my medicine? Keep out of the reach of children. This  medicine can be abused. Keep your medicine in a safe place to protect it from theft. Do not share this medicine with anyone. Selling or giving away this medicine is dangerous and against the law. Store at room temperature between 20 and 25 degrees C (68 and 77 degrees F). Keep container tightly closed. Protect from light. This medicine may cause accidental overdose and death if it is  taken by other adults, children, or pets. Flush any unused medicine down the toilet to reduce the chance of harm. Do not use the medicine after the expiration date. NOTE: This sheet is a summary. It may not cover all possible information. If you have questions about this medicine, talk to your doctor, pharmacist, or health care provider.  2014, [employer]vier/[employer] Standard. (2012-10-13 13:17:35)

## 2013-04-03 ENCOUNTER — Other Ambulatory Visit: Payer: Self-pay | Admitting: *Deleted

## 2013-04-03 ENCOUNTER — Telehealth: Payer: Self-pay | Admitting: *Deleted

## 2013-04-03 ENCOUNTER — Encounter: Payer: Self-pay | Admitting: *Deleted

## 2013-04-03 DIAGNOSIS — C549 Malignant neoplasm of corpus uteri, unspecified: Secondary | ICD-10-CM

## 2013-04-03 NOTE — Telephone Encounter (Signed)
Per NP, called pt with Pathology results, Lymph nodes were clear, pt to expect a call from Dr. Clabe Seal office regarding radiation. Confirmed pt's upcoming appt with Dr. Alycia Rossetti on 04/08/13 at 9:30 am. Pt verbalized understanding no concerns at this time.

## 2013-04-03 NOTE — Progress Notes (Signed)
04/03/13-Referral to Radiation- appt with Dr. Sondra Come 04/09/13 at 0830

## 2013-04-06 ENCOUNTER — Encounter: Payer: Self-pay | Admitting: *Deleted

## 2013-04-08 ENCOUNTER — Ambulatory Visit: Payer: No Typology Code available for payment source | Admitting: Gynecologic Oncology

## 2013-04-08 ENCOUNTER — Encounter: Payer: Self-pay | Admitting: Gynecologic Oncology

## 2013-04-08 VITALS — BP 118/53 | HR 76 | Temp 98.2°F | Resp 18 | Wt 258.5 lb

## 2013-04-08 DIAGNOSIS — C541 Malignant neoplasm of endometrium: Secondary | ICD-10-CM

## 2013-04-08 NOTE — Progress Notes (Signed)
Consult Note: Gyn-Onc  Consult was requested by Dr. Dellie Burns the evaluation of Sheryl Mathis 43 y.o. female  CC:  Chief Complaint  Patient presents with  . Endometrial Cancer    Assessment/Plan:  Sheryl Mathis  is a 43 y.o.  year old status post laparoscopic hysterectomy bilateral salpingectomy for abnormal uterine bleeding with 2 prior endometrial sampling that were negative for malignancy. The specimen was morcellated into 4 pieces to facilitate removal from the vagina. 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 Based on the grade and lymphovascular space involvement is recommended that she undergo vaginal cuff brachytherapy. She is an appointment to see Dr. Freddi Che tomorrow. She will return to see [place] for postoperative visit. 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 She is very reassuring. She will call us with any concerns.   HPI: Sheryl. Sheryl Mathis  is a 43 y.o.G2 P2 last normal menstrual period in 11/03/2012. She reports abnormal uterine bleeding that began approximately 6 months ago. It became very heavy in September 2014 when she presented for evaluation and was initially placed on Megace.  12/01/12 ECC - BENIGN ENDOCERVICAL MUCOSA, - DETACHED FRAGMENTS OF SQUAMOUS EPITHELIUM; NEGATIVE FOR INTRAEPITHELIAL LESION OR MALIGANNCY. Endometrium, biopsy - FRAGMENTS OF ENDOMETRIOID-TYPE POLYP(S) WITH DECIDUALIZATION OF THE STROMA, CONSISTENT WITH HORMONE EFFECT. - THERE IS NO EVIDENCE OF HYPERPLASIA OR MALIGNANCY. Repeat biopsy on 01/02/2013 Endometrium, curettage - FRAGMENTS SUGGESTIVE OF ENDOMETRIOID TYPE POLYP(S) WITH DECIDUALIZATION OF THE STROMA, CONSISTENT  WITH HORMONE EFFECT. - BENIGN ENDOCERVICAL TYPE MUCOSA. - THERE IS NO EVIDENCE OF HYPERPLASIA OR MALIGNANCY.  On 01/22/2013 she underwent a total laparoscopic hysterectomy unilateral salpingectomy with "some  vaginal morcellation to deliver the uterus vaginally after the hysterectomy was completed"  Uterus and bilateral fallopian tubes, with cervix ( 4 sections) Specimen: Uterus, cervix, one fallopian tube Procedure: Hysterectomy and salpingectomy Lymph node sampling performed: No Specimen integrity: Fragmented, please see gross description for details. Maximum tumor size: Estimated size 6.5 cm, grossly Histologic type: Invasive endometrioid carcinoma Grade: FIGO Grade II Myometrial invasion: 0.9 cm where myometrium is 2.4 cm in thickness Cervical stromal involvement: No Lymph - vascular invasion: Present, extensively FIGO Stage (based on pathologic findings, needs clinical correlation): At least IA Comment:  Sections show an invasive endometrioid carcinoma involving the upper portion of the uterus, estimated 6.5 cm in greatest dimension. There is extensive angiolymphatic invasion present. IHC Expression Result: MLH1: Preserved nuclear expression (greater 50% tumor expression) MSH2: Preserved nuclear expression (greater 50% tumor expression) MSH6: Preserved nuclear expression (greater 50% tumor expression) PMS2: Preserved nuclear expression (greater 50% tumor expression) * Internal control demonstrates intact nuclear expression  Sheryl Mathis feels well she denies vaginal bleeding she states that her appetite is good there is some fatigue she denies weight loss or been no changes in her bowel or bladder routines. She denies a family history of malignancy.  Discussion with Dr. Harolyn Rutherford confirmed that there was vaginal not intra-abdominal morcellation and that both fallopian tubes were removed, although only one was in the pathology specimen.  CT 12/17/2015to assess for nodal or intraperioneal  disease notable for no retroperitoneal or retrocrural adenopathy. Normal colon, appendix, and terminal ileum. Normal small bowel without abdominal ascites. No evidence of omental  or peritoneal disease. No  pelvic adenopathy. Residual normal appearing left ovary on image 72 and right ovary on image 76. No adnexal mass. There is mild pelvic fascia thickening and trace fluid which is presumably postoperative.   On 03/31/13 she underwent surgery: Surgery: Robotic bilateral oophorectomy, left salpingectomy, bilateral pelvic lymph node dissection, right PA node sampling   Pathology: Bilateral ovaries, left fallopian tube, Right para-aortic lymph nodes, bilateral pelvic lymph nodes to pathology.   Operative findings: Status post hysterectomy with mild adhesive disease of the rectosigmoid colon to the left adnexa. Significant intra-abdominal obesity. Prominent bilateral pelvic lymph nodes.  Pathology revealed: Diagnosis 1. Ovary and fallopian tube, left - BENIGN OVARY WITH CYSTIC FOLLICLES. - SURFACE FOREIGN BODY GIANT CELL REACTION. - THERE IS NO EVIDENCE OF MALIGNANCY. - SEE COMMENT. 2. Ovary and fallopian tube, right - BENIGN OVARY WITH CYSTIC FOLLICLES. - THERE IS NO EVIDENCE OF MALIGNANCY. - SEE COMMENT. 3. Lymph nodes, regional resection, right pelvic - THERE IS NO EVIDENCE OF CARCINOMA IN 10 OF 10 LYMPH NODES (0/10). 4. Lymph nodes, regional resection, left pelvic - THERE IS NO EVIDENCE OF CARCINOMA IN 7 OF 7 LYMPH NODES (0/7). 5. Lymph nodes, regional resection, right para-aortic - THERE IS NO EVIDENCE OF CARCINOMA IN 1 OF 1 LYMPH NODE (0/1).  She is doing well postoperatively and comes in today for discussion of pathology. 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  Current Meds:  Outpatient Encounter Prescriptions as of 04/08/2013  Medication Sig  . ALPRAZolam (XANAX) 1 MG tablet Take 1 tablet (1 mg total) by mouth 3  (three) times daily as needed for anxiety.  Marland Kitchen amitriptyline (ELAVIL) 50 MG tablet Take 1 tablet (50 mg total) by mouth at bedtime.  . docusate sodium (COLACE) 100 MG capsule Take 1 capsule (100 mg total) by mouth 2 (two) times daily as needed for constipation.  . Ferrous Sulfate (IRON) 28 MG TABS Take 1 tablet (28 mg total) by mouth 2 (two) times daily. 256 mg of ferrous gluconate  . ibuprofen (ADVIL,MOTRIN) 600 MG tablet Take 1 tablet (600 mg total) by mouth every 6 (six) hours as needed.  Marland Kitchen LORazepam (ATIVAN) 1 MG tablet Take 1 mg by mouth every 6 (six) hours as needed for anxiety.  . metoCLOPramide (REGLAN) 10 MG tablet Take 1 tablet (10 mg total) by mouth every 6 (six) hours as needed for nausea or vomiting.  Marland Kitchen oxyCODONE-acetaminophen (PERCOCET/ROXICET) 5-325 MG per tablet Take 1-2 tablets by mouth every 4 (four) hours as needed for severe pain (moderate to severe pain (when tolerating fluids)).    Allergy: No Known Allergies  Social Hx:   History   Social History  . Marital Status: Legally Separated    Spouse Name: N/A    Number of Children: N/A  . Years of Education: N/A   Occupational History  . Not on file.   Social History Main Topics  . Smoking status: Current Every Day Smoker -- 0.50 packs/day    Types: Cigarettes  . Smokeless tobacco: Not on file  . Alcohol Use: No  . Drug Use: No  . Sexual Activity: Not Currently    Birth Control/ Protection: Surgical   Other Topics Concern  . Not on file   Social History Narrative  . No narrative on file    Past Surgical Hx:  Past Surgical History  Procedure Laterality Date  . Cesarean section      X2  . Tubal ligation    .  Dilitation & currettage/hystroscopy with hydrothermal ablation N/A 01/01/2013    Procedure: DILATATION & CURETTAGE/HYSTEROSCOPY WITH ATTEMPTED HYDROTHERMAL ABLATION: Change over to [place] @ 1505;  Surgeon: Osborne Oman, MD;  Location: [facility];  Service: Gynecology;  Laterality: N/A;   . Laparoscopic assisted vaginal hysterectomy N/A 01/22/2013    Procedure: LAPAROSCOPIC ASSISTED VAGINAL HYSTERECTOMY with Bilateral Fallopian Tubes and Ovaries;  Surgeon: Osborne Oman, MD;  Location: [facility];  Service: Gynecology;  Laterality: N/A;  . Robotic assisted total hysterectomy with bilateral salpingo oopherectomy Bilateral 03/31/2013    Procedure: ROBOTIC ASSISTED BILATERAL OOPHORECTOMY PELVIC AND POSSIBLE PARA-AORTIC LYMPH NODE DISSECTION;  Surgeon: Imagene Gurney A. Alycia Rossetti, MD;  Location: [facility];  Service: Gynecology;  Laterality: Bilateral;    Past Medical Hx:  Past Medical History  Diagnosis Date  . Anemia   . Menorrhagia   . Depression   . Anxiety   . Headache(784.0)     MIGRAINES  . Cancer     ENDOMETRIAL CANCER  . Complication of anesthesia     PT STATES SHE WOKE UP AFTER D&C [place].  STATES NO PROBLEMS WAKING UP AFTER HER HYSTERECTOMY    Past Gynecological History: Gravida 2 per 2 menarche at age 64 with regular menses until 6 months ago. Reports oral contraceptive pill use for 14 years in the remote past Patient's last menstrual period was 11/03/2012.  Family Hx:  Family History  Problem Relation Age of Onset  . Diabetes Mother     Vitals:  Blood pressure 118/53, pulse 76, temperature 98.2 F (36.8 C), temperature source Oral, resp. rate 18, weight 258 lb 8 oz (117.255 kg), last menstrual period 11/03/2012.  Physical Exam: WD in NAD  Psychiatry: Alert and oriented to person, place, and time  Abdomen : [facility] surgical  sites intact without evidence of hernia or masses. Steristrips in place    [facility] A., MD, 04/08/2013, 9:40 AM

## 2013-04-08 NOTE — Progress Notes (Signed)
GYN Location of Tumor / Histology: grade 2 endometrioid adenocarcinoma  Patient presented with abnormal uterine bleeding that began approximately 6 months ago. It became very heavy in September 2014 when she presented for evaluation and was initially placed on Megace.  Biopsies of revealed:   Diagnosis Uterus and bilateral fallopian tubes, with cervix - INVASIVE ENDOMETRIOID CARCINOMA, FIGO GRADE II, 6.5 CM, WITH EXTENSIVE ANGIOLYMPHATIC INVASION, CONFINED WITHIN INNER HALF OF MYOMETRIUM. - CERVIX: BENIGN SQUAMOUS MUCOSA AND ENDOCERVICAL MUCOSA, NO DYSPLASIA OR MALIGNANCY. - ONE FALLOPIAN TUBE: BENIGN FALLOPIAN TUBAL TISSUE, NO EVIDENCE OF ENDOMETRIOSIS, ATYPIA OR MALIGNANCY. PLEASE SEE COMMENT.  Past/Anticipated interventions by Gyn/Onc surgery, if any: 01/22/13 Procedure: LAPAROSCOPIC ASSISTED VAGINAL HYSTERECTOMY with Bilateral Fallopian Tubes and Ovaries;  Surgeon: Osborne Oman, MD;  Location: [facility];  Service: Gynecology and 03/31/13 Procedure: ROBOTIC ASSISTED BILATERAL OOPHORECTOMY PELVIC AND POSSIBLE PARA-AORTIC LYMPH NODE DISSECTION;  Surgeon: Imagene Gurney A. Alycia Rossetti, MD;  Location: [facility];  Service: Gynecology;  Laterality: Bilateral;  Past/Anticipated interventions by medical oncology, if any: None  Weight changes, if any: reports that she has gained 10-15 pounds since [facility] in October 2014  Bowel/Bladder complaints, if any: denies hematuria, dysuria, urgency or frequency  Nausea/Vomiting, if any: Denies  Pain issues, if any:  Denies  SAFETY ISSUES:  Prior radiation? no  Pacemaker/ICD? no  Possible current pregnancy? no  Is the patient on methotrexate? no  Current Complaints / other details:  Gravida 2 per 2 menarche at age 47 with regular menses until 6 months ago. Reports oral contraceptive pill use for 14 years in the remote past Patient's last menstrual period was 11/03/2012.  Reports numbness in her left leg related to lymph node dissection but, no edema noted. Denies  vaginal bleeding, discharge, itching or odor. Denies diarrhea or constipation.

## 2013-04-08 NOTE — Patient Instructions (Signed)
Cancer, Radiation Treatment Radiation therapy uses ionizing beams for killing cancer cells and shrinking tumors. Radiation damages both cancer cells and normal cells, but normal cells have the DNA to repair themselves. Cancer cells do not have DNA to repair themselves and therefore, are killed off by the radiation, and the body disposes of them. The goal of therapy is to kill as many cancer cells without causing harm to healthy cells near the cancer. Radiation therapy may also be used to reduce pain (palliative therapy).  RADIATION IS USED FOR DIFFERENT REASONS  As the primary or only treatment for the cancer.  Used before surgery to shrink a tumor.  Used after surgery to stop the growth of any remaining cancer cells.  Used in combination with other treatments to kill cancer cells.  Used in advanced cancer patients to help with symptoms of the cancer. RADIATION THERAPY MAY BE EXTERNAL OR INTERNAL  External beam radiation is used most often. It is given on an outpatient basis. This means you do not have to be hospitalized. The energy (source of radiation) used in external radiation therapy may come from X-rays or gamma rays. Although they are produced in different ways, both use packets of energy (photons). Lower energy beams can be used to destroy cancer cells on the surface of the body. Higher energy beams are used to treat cancer deep within the body. Compared with some other types of radiation, x-rays can deliver radiation to a fairly large area.  Internal radiation is called brachytherapy. There is a lose dose rate (LDR) where permanent seeds are implanted into the patient, usually used for GYN or Prostate cancer. This procedure is an inpatient procedure. High dose rate brachytherapy (HDR) is another type of radiation, which is an outpatient procedure. Needles are inserted into the patient, and the radiation travels through these needles to deliver the dose prescribed. This procedure is mainly  used for prostate and breast cancers. Both of these types use a live source of radiation. Radiation therapy may be used to treat almost all types of tumors. It is also used to treat leukemia and lymphoma. There are a few that are more radio resistant and may not respond to radiation alone. These are cancers of the blood-forming cells and lymphatic system.  For some types of cancer, radiation may be given to areas that do not have evidence of cancer. This is done to prevent cancer cells from growing in the area receiving the radiation. This technique is called prophylactic radiation therapy.  RISKS AND COMPLICATIONS Side effects of radiation therapy depend on which part of your body is exposed to radiation and how much radiation is used. Most people are affected by fatigue, which is the most common side effect.  Everybody deals with radiation differently. You cannot predict what the side effects will be. They do not occur right away, and it can take 2-3 weeks to develop side effects. Most side effects are temporary and can be controlled. Once the treatment is complete, the side effects do not stop right away. It can take up to 3-4 weeks to regain your energy or for the redness to go away. Your body does heal from the radiation. Some common side effects are:  Difficulty swallowing, coughing (head and neck cancers and lung cancers).  Feeling sick to your stomach (nauseous), vomiting and/or diarrhea (abdominal area or pelvis being treated).  Bladder problems, frequent urination and/or sexual dysfunction (bladder, kidney, prostate cancer).  Hair loss (brain tumors). BEFORE THE PROCEDURE There will  be a planning simulation usually in the radiation department using a CT planning scan. Your radiation oncologist will plan exactly where the radiation will be delivered. The treatment fields will be planned just for you in order to treat you the best way possible. They also make a plan to avoid critical  structures in the body. You may also be given a dye (contrast) during this CT planning scan or an MRI.  You will be positioned how you will be everyday for your treatment. The goal is to have a position that can be reproduced daily.  Usually, temporary marks will be placed on your body to give the therapists a place to shift from once the plan is complete. Then, tattoos are usually put on you in order for you to line up in the same way each day. These tattoos are permanent, but are the size of a freckle. PROCEDURE  You will lie perfectly still on a table in the position determined for treatment, while the linear accelerator moves around you.  This machine delivers the radiation in exact doses from many possible angles.  Treatments are usually spread out over several weeks. This is to allow your healthy cells to recover between sessions. Usually, treatments are spread out between 5-6 weeks, but this is determined by your radiation oncologist.  There is no pain during this treatment. You will not feel the radiation being delivered. The treatment takes about 15-20 minutes, including setup time. AFTER THE PROCEDURE You will have tests and follow-up appointments. They are usually 6 weeks to 6 months after radiation to see if the treatment helped reduce pain or eliminate the cancer cells. Document Released: 07/08/2008 Document Revised: 05/14/2011 Document Reviewed: 07/08/2008 [facility] Patient Information 2014 [place], [place].

## 2013-04-09 ENCOUNTER — Ambulatory Visit
Admission: RE | Admit: 2013-04-09 | Discharge: 2013-04-09 | Disposition: A | Discharge status: Home / Self Care | Payer: No Typology Code available for payment source | Source: Ambulatory Visit | Attending: Radiation Oncology | Admitting: Radiation Oncology

## 2013-04-09 ENCOUNTER — Encounter: Payer: Self-pay | Admitting: Radiation Oncology

## 2013-04-09 VITALS — Ht 67.0 in | Wt 259.3 lb

## 2013-04-09 DIAGNOSIS — Z90710 Acquired absence of both cervix and uterus: Secondary | ICD-10-CM | POA: Insufficient documentation

## 2013-04-09 DIAGNOSIS — G130 Paraneoplastic neuromyopathy and neuropathy: Secondary | ICD-10-CM

## 2013-04-09 DIAGNOSIS — Z9079 Acquired absence of other genital organ(s): Secondary | ICD-10-CM | POA: Insufficient documentation

## 2013-04-09 DIAGNOSIS — C549 Malignant neoplasm of corpus uteri, unspecified: Secondary | ICD-10-CM | POA: Insufficient documentation

## 2013-04-09 DIAGNOSIS — C541 Malignant neoplasm of endometrium: Secondary | ICD-10-CM

## 2013-04-09 DIAGNOSIS — D849 Immunodeficiency, unspecified: Secondary | ICD-10-CM | POA: Insufficient documentation

## 2013-04-09 NOTE — Progress Notes (Signed)
Radiation Oncology         [id number]) [id number] ________________________________  Initial outpatient Consultation  Name: Sheryl Mathis MRN: [id number]  Date: 04/09/2013  DOB: [birth year]  CC:No primary provider on file.  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 female who is seen out of the courtesy of Dr. [name] for an opinion concerning radiation therapy as part of management of patient's recently diagnosed endometrial cancer. The patient presented with abnormal uterine bleeding. She had 2 prior endometrial samplings  which were negative for malignancy. The patient proceeded to undergo laparoscopic hysterectomy, bilateral salpingectomy under the direction of Dr. Harolyn Rutherford. Final pathology from this surgery revealed grade 2 endometrioid adenocarcinoma, 37% myometrial invasion with extensive angiolymphatic involvement. The uterus was morecelated into 4 pieces to facilitate removal from the vagina.   The patient was referred to gynecologic oncology who recommended completion staging surgery. This surgery was performed on 03/31/2013. Patient underwent robotic bilateral oophorectomy, left salpingectomy, bilateral pelvic lymph node dissection and right periaortic node sampling. Intraoperative findings included mild adhesive disease of the rectosigmoid colon to the left adnexa, significant intra-abdominal obesity and prominent bilateral pelvic lymph nodes.   pathology from this second surgery revealed no evidence of metastasis in 18 lymph nodes recovered. Patient is been doing well since her surgery except for some mild numbness along her left thigh. She seen in radiation oncology to be considered for postoperative treatment.  PREVIOUS RADIATION THERAPY: No  PAST MEDICAL HISTORY:  has a past medical history of Anemia;  Menorrhagia; Depression; Anxiety; Headache(784.0); Cancer; and Complication of anesthesia.    PAST SURGICAL HISTORY: Past Surgical History  Procedure Laterality Date  . Cesarean section      X2  . Tubal ligation    . Dilitation & currettage/hystroscopy with hydrothermal ablation N/A 01/01/2013    Procedure: DILATATION & CURETTAGE/HYSTEROSCOPY WITH ATTEMPTED HYDROTHERMAL ABLATION: Change over to [place] @ 1505;  Surgeon: Osborne Oman, MD;  Location: [facility];  Service: Gynecology;  Laterality: N/A;  . Laparoscopic assisted vaginal hysterectomy N/A 01/22/2013    Procedure: LAPAROSCOPIC ASSISTED VAGINAL HYSTERECTOMY with Bilateral Fallopian Tubes and Ovaries;  Surgeon: Osborne Oman, MD;  Location: [facility];  Service: Gynecology;  Laterality: N/A;  . Robotic assisted total hysterectomy with bilateral salpingo oopherectomy Bilateral 03/31/2013    Procedure: ROBOTIC ASSISTED BILATERAL OOPHORECTOMY PELVIC AND POSSIBLE PARA-AORTIC LYMPH NODE DISSECTION;  Surgeon: Imagene Gurney A. Alycia Rossetti, MD;  Location: [facility];  Service: Gynecology;  Laterality: Bilateral;    FAMILY HISTORY: family history includes Diabetes in her mother. There is no history of Cancer.  SOCIAL HISTORY:  reports that she has been smoking Cigarettes.  She has been smoking about 0.50 packs per day. She has never used smokeless tobacco. She reports that she does not drink alcohol or use illicit drugs. 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  ALLERGIES: Review of patient's allergies indicates no known allergies.  MEDICATIONS:  Current Outpatient Prescriptions  Medication Sig Dispense Refill  . ALPRAZolam (XANAX) 1 MG tablet Take 1 tablet (1 mg total) by mouth 3 (three) times daily as needed for anxiety.  90 tablet  5  . amitriptyline (ELAVIL) 50 MG tablet Take 1 tablet (50 mg total) by mouth at bedtime.  30 tablet  5  . docusate sodium (COLACE) 100  MG capsule Take 1 capsule (100 mg total)  by mouth 2 (two) times daily as needed for constipation.  30 capsule  2  . Ferrous Sulfate (IRON) 28 MG TABS Take 1 tablet (28 mg total) by mouth 2 (two) times daily. 256 mg of ferrous gluconate  30 each  2  . ibuprofen (ADVIL,MOTRIN) 600 MG tablet Take 1 tablet (600 mg total) by mouth every 6 (six) hours as needed.  30 tablet  2  . LORazepam (ATIVAN) 1 MG tablet Take 1 mg by mouth every 6 (six) hours as needed for anxiety.      . metoCLOPramide (REGLAN) 10 MG tablet Take 1 tablet (10 mg total) by mouth every 6 (six) hours as needed for nausea or vomiting.  30 tablet  3  . oxyCODONE-acetaminophen (PERCOCET/ROXICET) 5-325 MG per tablet Take 1-2 tablets by mouth every 4 (four) hours as needed for severe pain (moderate to severe pain (when tolerating fluids)).  30 tablet  0   No current facility-administered medications for this encounter.    REVIEW OF SYSTEMS:  A 15 point review of systems is documented in the electronic medical record. This was obtained by the nursing staff. However, I reviewed this with the patient to discuss relevant findings and make appropriate changes.  She has mild soreness in the abdominal area from her laparoscopic procedure. She denies any nausea or bowel problems. She denies any vaginal bleeding or discharge. She denies any urination difficulties. Patient does have some numbness/tingling along her left upper thigh which has been present since her surgery.  She has had some mild swelling in her legs since surgery   PHYSICAL EXAM:  height is 5\' 7"  (1.702 m) and weight is 259 lb 4.8 oz (117.618 kg).   Ht 5\' 7"  (1.702 m)  Wt 259 lb 4.8 oz (117.618 kg)  BMI 40.60 kg/m2  LMP 11/03/2012  General Appearance:    Alert, cooperative, no distress, appears stated age  Head:    Normocephalic, without obvious abnormality, atraumatic  Eyes:    PERRL, conjunctiva/corneas clear, EOM's intact,     Ears:    Normal TM's and external ear canals, both ears  Nose:   Nares normal, septum  midline, mucosa normal, no drainage    or sinus tenderness  Throat:   Lips, mucosa, and tongue normal; teeth and gums normal  Neck:   Supple, symmetrical, trachea midline, no adenopathy;    thyroid:  no enlargement/tenderness/nodules; no carotid   bruit or JVD  Back:     Symmetric, no curvature, ROM normal, no CVA tenderness  Lungs:     Clear to auscultation bilaterally, respirations unlabored  Chest Wall:    No tenderness or deformity   Heart:    Regular rate and rhythm, S1 and S2 normal, no murmur, rub   or gallop     Abdomen:     Soft, non-tender, bowel sounds active all four quadrants,    no masses, no organomegaly, 5-6 small scars present from her recent surgery   Genitalia:   deferred until simulation and planning day   Rectal:   deferred until simulation and planning day   Extremities:   Extremities normal, atraumatic, no cyanosis or edema  Pulses:   2+ and symmetric all extremities  Skin:   Skin color, texture, turgor normal, no rashes or lesions  Lymph nodes:   Cervical, supraclavicular, and axillary nodes normal, no inguinal adenopathy   Neurologic:    normal strength, sensation and reflexes  Throughout except for numbness along the left upper thigh       ECOG = 1  1 - Symptomatic but completely ambulatory (Restricted in physically strenuous activity but ambulatory and able to carry out work of a light or sedentary nature. For example, light housework, office work)  LABORATORY DATA:  Lab Results  Component Value Date   WBC 13.2* 04/01/2013   HGB 11.3* 04/01/2013   HCT 35.5* 04/01/2013   MCV 75.7* 04/01/2013   PLT 374 04/01/2013   Lab Results  Component Value Date   NA 132* 04/01/2013   K 4.1 04/01/2013   CL 97 04/01/2013   CO2 23 04/01/2013   Lab Results  Component Value Date   ALT 30 03/26/2013   AST 27 03/26/2013   ALKPHOS 75 03/26/2013   BILITOT <0.2* 03/26/2013     RADIOGRAPHY: Chest 2 View  03/26/2013   CLINICAL DATA:  Endometrial carcinoma  EXAM: CHEST  2  VIEW  COMPARISON:  None.  FINDINGS: Lungs are clear. Heart size pulmonary vascularity are normal. No adenopathy. No blastic or lytic bone lesions. There is mild degenerative change in the thoracic spine.  IMPRESSION: Lungs clear.  No demonstrable adenopathy.   Electronically Signed   By: Lowella Grip M.D.   On: 03/26/2013 14:36   Endometrial cancer diagnosed in 2014.   Hysterectomy 01/22/2013.  EXAM:   CT ABDOMEN AND PELVIS WITH CONTRAST  TECHNIQUE:  Multidetector CT imaging of the abdomen and pelvis was performed  using the standard protocol following bolus administration of  intravenous contrast.  CONTRAST: 155mL OMNIPAQUE IOHEXOL 300 MG/ML SOLN  COMPARISON: 11/14/2012 pelvic ultrasound  FINDINGS:  Lower Chest: Clear lung bases. Normal heart size without pericardial  or pleural effusion.  Abdomen/Pelvis: Normal liver, spleen, stomach, pancreas.  Cholelithiasis without acute cholecystitis or biliary ductal  dilatation. Normal adrenal glands and left kidney. There is mild  prominence of the right renal pelvis and proximal right ureter,  without obstructive cause identified.  Age advanced aortic atherosclerosis. No retroperitoneal or  retrocrural adenopathy. Normal colon, appendix, and terminal ileum.  Normal small bowel without abdominal ascites. No evidence of omental  or peritoneal disease. No pelvic adenopathy. Residual normal  appearing left ovary on image 72 and right ovary on image 76. No  adnexal mass. There is mild pelvic fascia thickening and trace fluid  which is presumably postoperative.  Bones/Musculoskeletal: No acute osseous abnormality.   IMPRESSION:  1. Status post hysterectomy, without evidence of residual/recurrent  or metastatic disease.  2. Cholelithiasis.  Electronically Signed  By: Abigail Miyamoto M.D.  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  Based on the grade  and lymphovascular space involvement she is at risk for vaginal cuff recurrence and I would recommend that she undergo vaginal cuff brachytherapy.  I discussed the treatment course side effects and potential toxicities of radiation therapy in this situation with the patient. She appears to understand and wishes to proceed with planned course of treatment.     PLAN: Simulation planning and treatment after an appropriate period of  recuperation from her surgery. Anticipate 5 high-dose rate treatments directed at the proximal vagina.  I spent 60 minutes minutes face to face with the patient and more than 50% of that time was spent in counseling and/or coordination of care.   ------------------------------------------------  -----------------------------------  Blair Promise, PhD, MD

## 2013-04-09 NOTE — Progress Notes (Signed)
See progress note under physician encounter. 

## 2013-04-09 NOTE — Addendum Note (Signed)
Encounter addended by: Blair Promise, MD on: 04/09/2013  6:35 PM<BR>     Documentation filed: Arn Medal VN

## 2013-04-10 ENCOUNTER — Telehealth: Payer: Self-pay | Admitting: *Deleted

## 2013-04-10 NOTE — Progress Notes (Signed)
Complete PATIENT MEASURE OF DISTRESS worksheet with a score of 7 submitted to social work.

## 2013-04-10 NOTE — Addendum Note (Signed)
Addended by: [name] D on: 04/10/2013 02:59 PM   Modules accepted: Level of Service

## 2013-04-10 NOTE — Addendum Note (Signed)
Encounter addended by: Heywood Footman, RN on: 04/10/2013  9:50 AM<BR>     Documentation filed: Notes Section

## 2013-04-10 NOTE — Telephone Encounter (Signed)
CALLED PATIENT TO INFORM OF HDR VAG. CUFF CASE , SPOKE WITH PATIENT AND SHE IS AWARE OF THESE APPTS.

## 2013-04-17 NOTE — Progress Notes (Signed)
[place] Psychosocial Distress Screening Clinical Social Work  Clinical Social Work was referred by distress screening protocol.  The patient scored a 7 on the Psychosocial Distress Thermometer which indicates moderate distress. Clinical Social Worker Intern telephoned to assess for distress and other psychosocial needs. Patient stated everything was fine and nothing was needed.  Patient is aware and agrees to seek out further assistance if needed.   Clinical Social Worker follow up needed: no  If yes, follow up plan:   Shereen Marton S. [place] Work Intern [employer] [phone]

## 2013-04-21 NOTE — Progress Notes (Signed)
GYN Location of Tumor / Histology: grade 2 endometrioid adenocarcinoma   Patient presented with abnormal uterine bleeding that began approximately 6 months ago. It became very heavy in September 2014 when she presented for evaluation and was initially placed on Megace.   Biopsies of revealed:   Diagnosis  Uterus and bilateral fallopian tubes, with cervix  - INVASIVE ENDOMETRIOID CARCINOMA, FIGO GRADE II, 6.5 CM, WITH EXTENSIVE  ANGIOLYMPHATIC INVASION, CONFINED WITHIN INNER HALF OF MYOMETRIUM.  - CERVIX: BENIGN SQUAMOUS MUCOSA AND ENDOCERVICAL MUCOSA, NO DYSPLASIA OR  MALIGNANCY.  - ONE FALLOPIAN TUBE: BENIGN FALLOPIAN TUBAL TISSUE, NO EVIDENCE OF ENDOMETRIOSIS,  ATYPIA OR MALIGNANCY. PLEASE SEE COMMENT.   Past/Anticipated interventions by Gyn/Onc surgery, if any: 01/22/13 Procedure: LAPAROSCOPIC ASSISTED VAGINAL HYSTERECTOMY with Bilateral Fallopian Tubes and Ovaries; Surgeon: Osborne Oman, MD; Location: [facility]; Service: Gynecology and 03/31/13 Procedure: ROBOTIC ASSISTED BILATERAL OOPHORECTOMY PELVIC AND POSSIBLE PARA-AORTIC LYMPH NODE DISSECTION; Surgeon: Imagene Gurney A. Alycia Rossetti, MD; Location: [facility]; Service: Gynecology; Laterality: Bilateral;   Past/Anticipated interventions by medical oncology, if any: None   Weight changes, if any: reports that she has gained 10-15 pounds since [facility] in October 2014   Bowel/Bladder complaints, if any: denies hematuria, dysuria, urgency or frequency   Nausea/Vomiting, if any: Denies   Pain issues, if any: Denies   SAFETY ISSUES:  Prior radiation? no  Pacemaker/ICD? no  Possible current pregnancy? no  Is the patient on methotrexate? No  Current Complaints / other details: Gravida 2 per 2 menarche at age 30 with regular menses until 6 months ago. Reports oral contraceptive pill use for 14 years in the remote past Patient's last menstrual period was 11/03/2012.  Reports numbness in her left leg related to lymph node dissection but, no edema noted.  Denies vaginal bleeding, discharge, itching or odor. Denies diarrhea or constipation.

## 2013-04-22 ENCOUNTER — Telehealth: Payer: Self-pay | Admitting: *Deleted

## 2013-04-22 NOTE — Telephone Encounter (Signed)
Called patient to remind of appts. For 04-23-13, lvm for a return call

## 2013-04-23 ENCOUNTER — Encounter: Payer: Self-pay | Admitting: Radiation Oncology

## 2013-04-23 ENCOUNTER — Ambulatory Visit
Admission: RE | Admit: 2013-04-23 | Discharge: 2013-04-23 | Disposition: A | Discharge status: Home / Self Care | Payer: No Typology Code available for payment source | Source: Ambulatory Visit | Attending: Radiation Oncology | Admitting: Radiation Oncology

## 2013-04-23 ENCOUNTER — Telehealth: Payer: Self-pay | Admitting: Gynecologic Oncology

## 2013-04-23 ENCOUNTER — Ambulatory Visit
Admission: RE | Admit: 2013-04-23 | Discharge: 2013-04-23 | Disposition: A | Discharge status: Home / Self Care | Payer: MEDICAID | Source: Ambulatory Visit | Attending: Radiation Oncology | Admitting: Radiation Oncology

## 2013-04-23 ENCOUNTER — Telehealth: Payer: Self-pay | Admitting: *Deleted

## 2013-04-23 VITALS — BP 137/83 | HR 79 | Temp 97.9°F | Resp 16 | Ht 67.0 in | Wt 260.6 lb

## 2013-04-23 DIAGNOSIS — C541 Malignant neoplasm of endometrium: Secondary | ICD-10-CM

## 2013-04-23 DIAGNOSIS — C55 Malignant neoplasm of uterus, part unspecified: Secondary | ICD-10-CM

## 2013-04-23 DIAGNOSIS — R55 Syncope and collapse: Secondary | ICD-10-CM

## 2013-04-23 DIAGNOSIS — C549 Malignant neoplasm of corpus uteri, unspecified: Secondary | ICD-10-CM | POA: Insufficient documentation

## 2013-04-23 DIAGNOSIS — Z510 Encounter for antineoplastic radiation therapy: Secondary | ICD-10-CM | POA: Insufficient documentation

## 2013-04-23 MED ORDER — VENLAFAXINE HCL ER 37.5 MG PO CP24: 37.5000 mg | ORAL_CAPSULE | Freq: Every day | ORAL | Status: DC

## 2013-04-23 NOTE — Progress Notes (Signed)
Please see the Nurse Progress Note in the MD Initial Consult Encounter for this patient. 

## 2013-04-23 NOTE — Telephone Encounter (Signed)
Patient here today for HDR, her c/o is "horrible hot flashes, unbearable,I cannot continue without something to help this" she asked [place] to call Dr.Brewster's nurse and see if she could come down to radiation and talk with her about what she could take,,I asked Dr.kinard about this, he said it was all right with him to call and see if the nurse practioner  Would agree to come down 8:22 AM

## 2013-04-23 NOTE — Progress Notes (Signed)
   Department of Radiation Oncology  Phone:  [phone] Fax:        [phone]  Simple treatment device note  Patient had construction of her custom vaginal cylinder for treatment today. Patient will be treated with three 3.0 cm rings placed proximally within the vaginal vault. More distally will be two 2.5 cm rings.  This arrangement optimally distended the vaginal vault without undue discomfort.  -----------------------------------  Blair Promise, PhD, MD

## 2013-04-23 NOTE — Telephone Encounter (Signed)
Returned call to patient.  She had voiced concerns about hot flashes while in radiation today.  She reports having moderate hot flashes every night that keep her awake.  If she is able to go to sleep, she wakes up every couple hours, dripping sweat with a heat rash on her chest.  Requesting treatment for her hot flashes.  Situation discussed with Dr. Skeet Latch, who recommended trying Effexor XL 37.5 mg once daily.  Reportable signs and symptoms reviewed.  Advised to call the office for any mood changes or development of suicidal ideations.  She is to call the office next week with an update.  Script sent to [place] on [address].

## 2013-04-23 NOTE — Progress Notes (Signed)
  Radiation Oncology         [phone] ________________________________  Name: Sheryl Mathis MRN: [id number]  Date: 04/23/2013  DOB: 1970-11-15  Vaginal brachytherapy procedure note  CC: No primary provider on file.  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 Patient proceeded to undergo pelvic examination. On speculum examination the vaginal cuff was well-healed without signs of infection or drainage. The bimanual examination confirmed an intact vaginal cuff.  Patient proceeded to undergo fitting for her vaginal cylinder. Optimal diameter to distend the vaginal vault without undue discomfort was a 3 cm diameter cylinder. Patient tolerated the procedure well. The vaginal cylinder to be used for treatment was subsequently removed and will be inserted in the simulation suite for planning purposes.                            ALLERGIES:  has No Known Allergies.   Physical Findings: The patient is in no acute distress. Patient is alert and oriented.  height is 5\' 7"  (1.702 m) and weight is 260 lb 9.6 oz (118.207 kg). Her oral temperature is 97.9 F (36.6 C). Her blood pressure is 137/83 and her pulse is 79. Her respiration is 16. .  No significant changes.     Impression: The patient underwent successful fitting for her vaginal cylinder  Plan:  Patient will be transferred to the simulation suite for CT scan with the cylinder in place for planning purposes.  ____________________________________ Blair Promise, MD

## 2013-04-23 NOTE — Progress Notes (Signed)
   Department of Radiation Oncology  Phone:  [phone] Fax:        [phone]  Simulation note  Patient was transferred to the [place]. The vaginal cylinder was then placed in the vaginal vault. This cylinder was affixed to the CT/MR stabilization plate to prevent slippage. Patient then proceeded to undergo CT scan through the pelvis area. The vaginal cylinder was noted to be in good position within the proximal vagina. Patient will undergo contouring of the cylinder as well as bladder and rectum and proceed with planning for her first high-dose-rate treatment.  Treatment plan:   patient will receive 6 gray to the proximal vagina. She will receive 5 treatments for a cumulative brachytherapy dose of 30 gray.  Prescription treatment length will be 4 cm and prescription will be to the vaginal mucosal surface. A 3 cm cylinder but will be used for the treatment.  Iridium 192 will be the high-dose-rate source.  -----------------------------------  Blair Promise, PhD, MD

## 2013-04-23 NOTE — Progress Notes (Signed)
   Department of Radiation Oncology  Phone:  [phone] Fax:        [phone]  High-dose-rate brachytherapy procedure note  After planning was complete the patient was transferred to the high dose rate suite. She was placed supine on the HDR treatment table. The previously constructed vaginal cylinder was placed in the proximal vagina. The vaginal cylinder was affixed to the CT/MR stabilization plate to prevent slippage.  Verification simulation note  The patient had an AP and lateral film obtained in the treatment position. This was compared to planning films earlier in the day documenting accurate position of the vaginal cylinder for treatment.  High-dose-rate brachytherapy procedure  The vaginal cylinder was affixed to the remote afterloading device by catheter system. The patient then proceeded to undergo her first high-dose-rate treatment directed at the proximal vagina. Patient was prescribed a dose of 6 gray to be delivered to the mucosal surface. This was achieved with 1 channel using 9 dwell positions.  Total treatment time was 320.2 seconds. Patient tolerated the procedure well. After completion of her therapy a radiation survey was performed documenting return of the iridium source into the Nucletron safe. The vaginal cylinder was subsequently removed without difficulty.  -----------------------------------  Blair Promise, PhD, MD

## 2013-04-29 ENCOUNTER — Telehealth: Payer: Self-pay | Admitting: *Deleted

## 2013-04-29 NOTE — Telephone Encounter (Signed)
Called patient to remind of HDR Tx. For 04-30-13, lvm for a return call

## 2013-04-29 NOTE — Telephone Encounter (Signed)
Called patient to remind of HDR Tx. For 04-30-13, no answer, will call later

## 2013-04-30 ENCOUNTER — Ambulatory Visit
Admission: RE | Admit: 2013-04-30 | Discharge: 2013-04-30 | Disposition: A | Discharge status: Home / Self Care | Payer: No Typology Code available for payment source | Source: Ambulatory Visit | Attending: Radiation Oncology | Admitting: Radiation Oncology

## 2013-04-30 DIAGNOSIS — C541 Malignant neoplasm of endometrium: Secondary | ICD-10-CM

## 2013-04-30 NOTE — Progress Notes (Signed)
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 Patient proceeded to undergo pelvic examination.  The bimanual examination confirmed an intact vaginal cuff. Patient proceeded to undergo placement of her vaginal cylinder. Optimal diameter to distend the vaginal vault without undue discomfort was a 3 cm diameter cylinder. Patient tolerated the procedure well. The vaginal cylinder was affixed to the CT/MR stabilization plate to prevent slippage.   Simple treatment device note   Patient had construction of her custom vaginal cylinder for treatment today. Patient will be treated with three 3.0 cm rings placed proximally within the vaginal vault. More distally will be two 2.5 cm rings. This arrangement optimally distended the vaginal vault without undue discomfort.   High-dose-rate brachytherapy procedure note    Verification simulation note   The patient had an AP and lateral film obtained in the treatment position. This was compared to planning films last week documenting accurate position of the vaginal cylinder for treatment.   High-dose-rate brachytherapy procedure   The vaginal cylinder was affixed to the remote afterloading device by catheter system. The patient then proceeded to undergo her second high-dose-rate treatment directed at the proximal vagina. Patient was prescribed a dose of 6 gray to be delivered to the mucosal surface. This was achieved with 1 channel using 9 dwell positions. Total treatment time was 341.7 seconds. Patient tolerated the procedure well. After completion of her therapy a radiation survey was performed documenting return of the iridium source into the Nucletron safe. The vaginal cylinder was subsequently removed without difficulty.  -----------------------------------   Blair Promise, PhD, MD

## 2013-05-06 ENCOUNTER — Telehealth: Payer: Self-pay | Admitting: *Deleted

## 2013-05-06 NOTE — Telephone Encounter (Signed)
CALLED PATIENT TO REMIND OF HDR [place] 05-07-13, LVM FOR A RETURN CALL

## 2013-05-06 NOTE — Telephone Encounter (Signed)
XXXXX

## 2013-05-07 ENCOUNTER — Ambulatory Visit
Admission: RE | Admit: 2013-05-07 | Discharge: 2013-05-07 | Disposition: A | Discharge status: Home / Self Care | Payer: No Typology Code available for payment source | Source: Ambulatory Visit | Attending: Radiation Oncology | Admitting: Radiation Oncology

## 2013-05-07 DIAGNOSIS — C541 Malignant neoplasm of endometrium: Secondary | ICD-10-CM

## 2013-05-07 NOTE — Progress Notes (Signed)
   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 Patient proceeded to undergo pelvic examination. The bimanual examination confirmed an intact vaginal cuff. Patient proceeded to undergo placement of her vaginal cylinder. Optimal diameter to distend the vaginal vault without undue discomfort was a 3 cm diameter cylinder. Patient tolerated the procedure well. The vaginal cylinder was affixed to the CT/MR stabilization plate to prevent slippage.   Simple treatment device note   Patient had construction of her custom vaginal cylinder for treatment today. Patient will be treated with three 3.0 cm rings placed proximally within the vaginal vault. More distally will be two 2.5 cm rings. This arrangement optimally distended the vaginal vault without undue discomfort.   High-dose-rate brachytherapy procedure note   Verification simulation note   The patient had an AP and lateral film obtained in the treatment position. This was compared to planning films last week documenting accurate position of the vaginal cylinder for treatment.   High-dose-rate brachytherapy procedure   The vaginal cylinder was affixed to the remote afterloading device by catheter system. The patient then proceeded to undergo her third high-dose-rate treatment directed at the proximal vagina. Patient was prescribed a dose of 6 gray to be delivered to the mucosal surface. This was achieved with 1 channel using 9 dwell positions. Total treatment time was 365.1 seconds. Patient tolerated the procedure well. After completion of her therapy a radiation survey was performed documenting return of the iridium source into the Nucletron safe. The vaginal cylinder was subsequently removed without  difficulty.  -----------------------------------   Blair Promise, PhD, MD

## 2013-05-13 ENCOUNTER — Telehealth: Payer: Self-pay | Admitting: *Deleted

## 2013-05-13 NOTE — Telephone Encounter (Signed)
Called patient to remind of HDR Tx. For 05-14-13 @ 10 am , spoke with patient and she is aware of this appt.

## 2013-05-14 ENCOUNTER — Ambulatory Visit
Admission: RE | Admit: 2013-05-14 | Discharge: 2013-05-14 | Disposition: A | Discharge status: Home / Self Care | Payer: No Typology Code available for payment source | Source: Ambulatory Visit | Attending: Radiation Oncology | Admitting: Radiation Oncology

## 2013-05-14 DIAGNOSIS — C541 Malignant neoplasm of endometrium: Secondary | ICD-10-CM

## 2013-05-14 NOTE — Progress Notes (Signed)
   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 Patient proceeded to undergo pelvic examination. The bimanual examination confirmed an intact vaginal cuff. Patient proceeded to undergo placement of her vaginal cylinder. Optimal diameter to distend the vaginal vault without undue discomfort was a 3 cm diameter cylinder. Patient tolerated the procedure well. The vaginal cylinder was affixed to the CT/MR stabilization plate to prevent slippage.   Simple treatment device note   Patient had construction of her custom vaginal cylinder for treatment today. Patient will be treated with three 3.0 cm rings placed proximally within the vaginal vault. More distally will be two 2.5 cm rings. This arrangement optimally distended the vaginal vault without undue discomfort.   High-dose-rate brachytherapy procedure note   Verification simulation note   The patient had an AP and lateral film obtained in the treatment position. This was compared to planning films last week documenting accurate position of the vaginal cylinder for treatment.   High-dose-rate brachytherapy procedure   The vaginal cylinder was affixed to the remote afterloading device by catheter system. The patient then proceeded to undergo her fourth high-dose-rate treatment directed at the proximal vagina. Patient was prescribed a dose of 6 gray to be delivered to the mucosal surface. This was achieved with 1 channel using 9 dwell positions. Total treatment time was 389.6 seconds. Patient tolerated the procedure well. After completion of her therapy a radiation survey was performed documenting return of the iridium source into the Nucletron safe. The vaginal cylinder was subsequently removed without  difficulty.  -----------------------------------   Blair Promise, PhD, MD

## 2013-05-20 ENCOUNTER — Telehealth: Payer: Self-pay | Admitting: *Deleted

## 2013-05-20 ENCOUNTER — Other Ambulatory Visit: Payer: Self-pay | Admitting: Gynecologic Oncology

## 2013-05-20 ENCOUNTER — Encounter: Payer: Self-pay | Admitting: Gynecologic Oncology

## 2013-05-20 ENCOUNTER — Ambulatory Visit: Payer: No Typology Code available for payment source | Attending: Gynecologic Oncology | Admitting: Gynecologic Oncology

## 2013-05-20 VITALS — BP 151/107 | HR 100 | Temp 98.0°F | Resp 18 | Wt 253.0 lb

## 2013-05-20 DIAGNOSIS — F17200 Nicotine dependence, unspecified, uncomplicated: Secondary | ICD-10-CM | POA: Insufficient documentation

## 2013-05-20 DIAGNOSIS — C549 Malignant neoplasm of corpus uteri, unspecified: Secondary | ICD-10-CM | POA: Insufficient documentation

## 2013-05-20 DIAGNOSIS — F3289 Other specified depressive episodes: Secondary | ICD-10-CM | POA: Insufficient documentation

## 2013-05-20 DIAGNOSIS — F329 Major depressive disorder, single episode, unspecified: Secondary | ICD-10-CM | POA: Insufficient documentation

## 2013-05-20 DIAGNOSIS — G43909 Migraine, unspecified, not intractable, without status migrainosus: Secondary | ICD-10-CM | POA: Insufficient documentation

## 2013-05-20 DIAGNOSIS — F411 Generalized anxiety disorder: Secondary | ICD-10-CM | POA: Insufficient documentation

## 2013-05-20 DIAGNOSIS — C541 Malignant neoplasm of endometrium: Secondary | ICD-10-CM

## 2013-05-20 DIAGNOSIS — C3490 Malignant neoplasm of unspecified part of unspecified bronchus or lung: Secondary | ICD-10-CM

## 2013-05-20 MED ORDER — BUTALBITAL-APAP-CAFFEINE 50-325-40 MG PO TABS: 1.0000 | ORAL_TABLET | Freq: Four times a day (QID) | ORAL | Status: DC | PRN

## 2013-05-20 MED ORDER — IBUPROFEN 600 MG PO TABS: 600.0000 mg | ORAL_TABLET | Freq: Four times a day (QID) | ORAL | Status: DC | PRN

## 2013-05-20 MED ORDER — OXYCODONE-ACETAMINOPHEN 5-325 MG PO TABS: 1.0000 | ORAL_TABLET | ORAL | Status: DC | PRN

## 2013-05-20 NOTE — Telephone Encounter (Signed)
CALLED PATIENT TO REMIND OF HDR TX. FOR 05-21-13, SPOKE WITH PATIENT AND SHE IS AWARE OF THIS APPT.

## 2013-05-20 NOTE — Progress Notes (Signed)
Consult Note: Gyn-Onc  Consult was requested by Dr. Dellie Burns the evaluation of Georges Lynch Janeah Kovacich 43 y.o. female  CC:  Chief Complaint  Patient presents with  . Endometrial Carcinoma    Assessment/Plan:  Ms. ROYCE SCIARA  is a 43 y.o.  year old status post laparoscopic hysterectomy bilateral salpingectomy for abnormal uterine bleeding with 2 prior endometrial sampling that were negative for malignancy. The specimen was morcellated into 4 pieces to facilitate removal from the vagina. 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 Based on the grade and lymphovascular space involvement is recommended that she undergo vaginal cuff brachytherapy. She has her fifth treatment tomorrow. I refilled her ibuprofen and her Fioricet. Return to work on April 2. Return to see me in 4 months.  HPI: Ms. IYAH LAGUNA  is a 43 y.o.G2 P2 last normal menstrual period in 11/03/2012. She reports abnormal uterine bleeding that began approximately 6 months ago. It became very heavy in September 2014 when she presented for evaluation and was initially placed on Megace.  12/01/12 ECC - BENIGN ENDOCERVICAL MUCOSA, - DETACHED FRAGMENTS OF SQUAMOUS EPITHELIUM; NEGATIVE FOR INTRAEPITHELIAL LESION OR MALIGANNCY. Endometrium, biopsy - FRAGMENTS OF ENDOMETRIOID-TYPE POLYP(S) WITH DECIDUALIZATION OF THE STROMA, CONSISTENT WITH HORMONE EFFECT. - THERE IS NO EVIDENCE OF HYPERPLASIA OR MALIGNANCY. Repeat biopsy on 01/02/2013 Endometrium, curettage - FRAGMENTS SUGGESTIVE OF ENDOMETRIOID TYPE POLYP(S) WITH DECIDUALIZATION OF THE STROMA, CONSISTENT WITH HORMONE EFFECT. - BENIGN ENDOCERVICAL TYPE MUCOSA. - THERE IS NO EVIDENCE OF HYPERPLASIA OR MALIGNANCY.  On 01/22/2013 she underwent a total  laparoscopic hysterectomy unilateral salpingectomy with "some  vaginal morcellation to deliver the uterus vaginally after the hysterectomy was completed"  Uterus and bilateral fallopian tubes, with cervix ( 4 sections) Specimen: Uterus, cervix, one fallopian tube Procedure: Hysterectomy and salpingectomy Lymph node sampling performed: No Specimen integrity: Fragmented, please see gross description for details. Maximum tumor size: Estimated size 6.5 cm, grossly Histologic type: Invasive endometrioid carcinoma Grade: FIGO Grade II Myometrial invasion: 0.9 cm where myometrium is 2.4 cm in thickness Cervical stromal involvement: No Lymph - vascular invasion: Present, extensively FIGO Stage (based on pathologic findings, needs clinical correlation): At least IA Comment:  Sections show an invasive endometrioid carcinoma involving the upper portion of the uterus, estimated 6.5 cm in greatest dimension. There is extensive angiolymphatic invasion present. IHC Expression Result: MLH1: Preserved nuclear expression (greater 50% tumor expression) MSH2: Preserved nuclear expression (greater 50% tumor expression) MSH6: Preserved nuclear expression (greater 50% tumor expression) PMS2: Preserved nuclear expression (greater 50% tumor expression) * Internal control demonstrates intact nuclear expression  Ms Higdon feels well she denies vaginal bleeding she states that her appetite is good there is some fatigue she denies weight loss or been no changes in her bowel or bladder routines. She denies a family history of malignancy.  Discussion with Dr. Harolyn Rutherford confirmed that there was vaginal not intra-abdominal morcellation and that both fallopian tubes were removed, although only one was in the pathology specimen.  CT 12/17/2015to assess for nodal or intraperioneal disease notable for no retroperitoneal or retrocrural adenopathy. Normal colon, appendix, and terminal ileum. Normal small bowel without abdominal  ascites. No evidence of omental  or peritoneal disease. No pelvic adenopathy. Residual normal appearing left ovary on image 72 and right ovary on image 76. No adnexal mass. There is mild pelvic  fascia thickening and trace fluid which is presumably postoperative.   On 03/31/13 she underwent surgery: Surgery: Robotic bilateral oophorectomy, left salpingectomy, bilateral pelvic lymph node dissection, right PA node sampling   Pathology: Bilateral ovaries, left fallopian tube, Right para-aortic lymph nodes, bilateral pelvic lymph nodes to pathology.   Operative findings: Status post hysterectomy with mild adhesive disease of the rectosigmoid colon to the left adnexa. Significant intra-abdominal obesity. Prominent bilateral pelvic lymph nodes.  Pathology revealed: Diagnosis 1. Ovary and fallopian tube, left - BENIGN OVARY WITH CYSTIC FOLLICLES. - SURFACE FOREIGN BODY GIANT CELL REACTION. - THERE IS NO EVIDENCE OF MALIGNANCY. - SEE COMMENT. 2. Ovary and fallopian tube, right - BENIGN OVARY WITH CYSTIC FOLLICLES. - THERE IS NO EVIDENCE OF MALIGNANCY. - SEE COMMENT. 3. Lymph nodes, regional resection, right pelvic - THERE IS NO EVIDENCE OF CARCINOMA IN 10 OF 10 LYMPH NODES (0/10). 4. Lymph nodes, regional resection, left pelvic - THERE IS NO EVIDENCE OF CARCINOMA IN 7 OF 7 LYMPH NODES (0/7). 5. Lymph nodes, regional resection, right para-aortic - THERE IS NO EVIDENCE OF CARCINOMA IN 1 OF 1 LYMPH NODE (0/1).  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 It is about the same as when I last saw her. She has started exercising. She's walking on the treadmill about 30 minutes a day and starting to do the elliptical. She is looking forward to going back to work on April 2. Her employer will let her go back to work on a part-time basis for a few weeks. She's tolerating her radiation therapy very well. She has treatment #5 tomorrow and this  will complete her therapy. She does have migraine is not sure was due to sinus is that she needs refills on her Fioricet. Most likely. Ibuprofen. She is working on getting a new primary care physician. In the past she's been on Imitrex for migraines. She has slight dysuria no change in her bowel habits. No abdominal pain. No vaginal bleeding.  Current Meds:  Outpatient Encounter Prescriptions as of 05/20/2013  Medication Sig  . ALPRAZolam (XANAX) 1 MG tablet Take 1 tablet (1 mg total) by mouth 3 (three) times daily as needed for anxiety.  Marland Kitchen amitriptyline (ELAVIL) 50 MG tablet Take 1 tablet (50 mg total) by mouth at bedtime.  . docusate sodium (COLACE) 100 MG capsule Take 1 capsule (100 mg total) by mouth 2 (two) times daily as needed for constipation.  . Ferrous Sulfate (IRON) 28 MG TABS Take 1 tablet (28 mg total) by mouth 2 (two) times daily. 256 mg of ferrous gluconate  . ibuprofen (ADVIL,MOTRIN) 600 MG tablet Take 1 tablet (600 mg total) by mouth every 6 (six) hours as needed.  Marland Kitchen LORazepam (ATIVAN) 1 MG tablet Take 1 mg by mouth every 6 (six) hours as needed for anxiety.  . metoCLOPramide (REGLAN) 10 MG tablet Take 1 tablet (10 mg total) by mouth every 6 (six) hours as needed for nausea or vomiting.  Marland Kitchen oxyCODONE-acetaminophen (PERCOCET/ROXICET) 5-325 MG per tablet Take 1-2 tablets by mouth every 4 (four) hours as needed for severe pain (moderate to severe pain (when tolerating fluids)).  Marland Kitchen venlafaxine XR (EFFEXOR-XR) 37.5 MG 24 hr capsule Take 1 capsule (37.5 mg total) by mouth daily.    Allergy: No Known Allergies  Social Hx:   History   Social History  . Marital Status: Legally Separated    Spouse Name: N/A    Number of Children:  N/A  . Years of Education: N/A   Occupational History  . Not on file.   Social History Main Topics  . Smoking status: Current Every Day Smoker -- 0.50 packs/day    Types: Cigarettes  . Smokeless tobacco: Never Used  . Alcohol Use: No  . Drug Use: No   . Sexual Activity: Not Currently    Birth Control/ Protection: Surgical   Other Topics Concern  . Not on file   Social History Narrative  . No narrative on file    Past Surgical Hx:  Past Surgical History  Procedure Laterality Date  . Cesarean section      X2  . Tubal ligation    . Dilitation & currettage/hystroscopy with hydrothermal ablation N/A 01/01/2013    Procedure: DILATATION & CURETTAGE/HYSTEROSCOPY WITH ATTEMPTED HYDROTHERMAL ABLATION: Change over to [place] @ 1505;  Surgeon: Osborne Oman, MD;  Location: [facility];  Service: Gynecology;  Laterality: N/A;  . Laparoscopic assisted vaginal hysterectomy N/A 01/22/2013    Procedure: LAPAROSCOPIC ASSISTED VAGINAL HYSTERECTOMY with Bilateral Fallopian Tubes and Ovaries;  Surgeon: Osborne Oman, MD;  Location: [facility];  Service: Gynecology;  Laterality: N/A;  . Robotic assisted total hysterectomy with bilateral salpingo oopherectomy Bilateral 03/31/2013    Procedure: ROBOTIC ASSISTED BILATERAL OOPHORECTOMY PELVIC AND POSSIBLE PARA-AORTIC LYMPH NODE DISSECTION;  Surgeon: Imagene Gurney A. Alycia Rossetti, MD;  Location: [facility];  Service: Gynecology;  Laterality: Bilateral;    Past Medical Hx:  Past Medical History  Diagnosis Date  . Anemia   . Menorrhagia   . Depression   . Anxiety   . Headache(784.0)     MIGRAINES  . Cancer     ENDOMETRIAL CANCER  . Complication of anesthesia     PT STATES SHE WOKE UP AFTER D&C [place].  STATES NO PROBLEMS WAKING UP AFTER HER HYSTERECTOMY    Past Gynecological History: Gravida 2 per 2 menarche at age 18 with regular menses until 6 months ago. Reports oral contraceptive pill use for 14 years in the remote past Patient's last menstrual period was 11/03/2012.  Family Hx:  Family History  Problem Relation Age of Onset  . Diabetes Mother   . Cancer Neg Hx     Vitals:  Blood pressure 151/107, pulse 100, temperature 98 F (36.7 C), temperature source Oral, resp.  rate 18, weight 253 lb (114.76 kg), last menstrual period 11/03/2012.  Physical Exam: WD in NAD  Psychiatry: Alert and oriented to person, place, and time  Abdomen : [facility] surgical  sites intact without evidence of hernia or masses.     [name] A., MD, 05/20/2013, 10:58 AM

## 2013-05-20 NOTE — Patient Instructions (Signed)
Please call in May to schedule an appt for July 2015 with Dr. Alycia Rossetti.

## 2013-05-21 ENCOUNTER — Ambulatory Visit
Admission: RE | Admit: 2013-05-21 | Discharge: 2013-05-21 | Disposition: A | Discharge status: Home / Self Care | Payer: No Typology Code available for payment source | Source: Ambulatory Visit | Attending: Radiation Oncology | Admitting: Radiation Oncology

## 2013-05-21 ENCOUNTER — Encounter: Payer: Self-pay | Admitting: Radiation Oncology

## 2013-05-21 DIAGNOSIS — C541 Malignant neoplasm of endometrium: Secondary | ICD-10-CM

## 2013-05-21 NOTE — Progress Notes (Signed)
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 Patient proceeded to undergo pelvic examination. The bimanual examination confirmed an intact vaginal cuff. Patient proceeded to undergo placement of her vaginal cylinder. Optimal diameter to distend the vaginal vault without undue discomfort was a 3 cm diameter cylinder. Patient tolerated the procedure well. The vaginal cylinder was affixed to the CT/MR stabilization plate to prevent slippage.   Simple treatment device note   Patient had construction of her custom vaginal cylinder for treatment today. Patient will be treated with three 3.0 cm rings placed proximally within the vaginal vault. More distally will be two 2.5 cm rings. This arrangement optimally distended the vaginal vault without undue discomfort.   High-dose-rate brachytherapy procedure note   Verification simulation note   The patient had an AP and lateral film obtained in the treatment position. This was compared to planning films last week documenting accurate position of the vaginal cylinder for treatment.   High-dose-rate brachytherapy procedure   The vaginal cylinder was affixed to the remote afterloading device by catheter system. The patient then proceeded to undergo her fifth high-dose-rate treatment directed at the proximal vagina. Patient was prescribed a dose of 6 gray to be delivered to the mucosal surface. This was achieved with 1 channel using 9 dwell positions. Total treatment time was 416.1 seconds. Patient tolerated the procedure well. After completion of her therapy a radiation survey was performed documenting return of the iridium source into the Nucletron safe. The vaginal  cylinder was subsequently removed without difficulty.  -----------------------------------   Blair Promise, PhD

## 2013-05-21 NOTE — Progress Notes (Signed)
  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  The patient was treated with a 3 cm diameter cylinder. Prescription was to the mucosal surface. Treatment length was 4 cm  Narrative: The patient tolerated radiation treatment relatively well.   She had some mild vaginal soreness with her treatments but otherwise tolerated well.  Plan: The patient has completed radiation treatment. The patient will return to radiation oncology clinic for routine followup in one month. I advised them to call or return sooner if they have any questions or concerns related to their recovery or treatment.  -----------------------------------  Blair Promise, PhD, MD

## 2013-06-24 ENCOUNTER — Encounter: Payer: Self-pay | Admitting: Oncology

## 2013-06-25 ENCOUNTER — Encounter: Payer: Self-pay | Admitting: Radiation Oncology

## 2013-06-25 ENCOUNTER — Ambulatory Visit
Admission: RE | Admit: 2013-06-25 | Discharge: 2013-06-25 | Disposition: A | Discharge status: Home / Self Care | Payer: No Typology Code available for payment source | Source: Ambulatory Visit | Attending: Radiation Oncology | Admitting: Radiation Oncology

## 2013-06-25 VITALS — BP 134/69 | HR 95 | Temp 97.8°F | Ht 67.0 in | Wt 247.6 lb

## 2013-06-25 DIAGNOSIS — C541 Malignant neoplasm of endometrium: Secondary | ICD-10-CM

## 2013-06-25 NOTE — Progress Notes (Signed)
Sheryl Mathis here for follow up after HDR treatment.  She denies pain.  She does report numbness in her left upper leg that is from surgery.  She denies vaginal/rectal bleeding, bladder issues, fatigue and diarrhea.  She reports that she is trying to lose weight and has lost 6 lbs since 05/20/13.  She reports that she feels better now than she has in a year.  She has gone back to work part time.

## 2013-06-25 NOTE — Progress Notes (Signed)
  Radiation Oncology         [phone] ________________________________  Name: Sheryl Mathis MRN: [id number]  Date: 06/25/2013  DOB: Aug 17, 1970  Follow-Up Visit Note  CC: No primary provider on file.  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   Narrative:  The patient returns today for routine follow-up.  She is doing well at this time. She has started back to work. She has some numbness in her left upper thigh which is been present since her surgery. She denies any vaginal discomfort, vaginal bleeding urination difficulties or bowel complaints.                                ALLERGIES:  has No Known Allergies.  Meds: Current Outpatient Prescriptions  Medication Sig Dispense Refill  . ALPRAZolam (XANAX) 1 MG tablet Take 1 tablet (1 mg total) by mouth 3 (three) times daily as needed for anxiety.  90 tablet  5  . amitriptyline (ELAVIL) 50 MG tablet Take 1 tablet (50 mg total) by mouth at bedtime.  30 tablet  5  . butalbital-acetaminophen-caffeine (FIORICET) 50-325-40 MG per tablet Take 1-2 tablets by mouth every 6 (six) hours as needed for headache.  30 tablet  0  . docusate sodium (COLACE) 100 MG capsule Take 1 capsule (100 mg total) by mouth 2 (two) times daily as needed for constipation.  30 capsule  2  . Ferrous Sulfate (IRON) 28 MG TABS Take 1 tablet (28 mg total) by mouth 2 (two) times daily. 256 mg of ferrous gluconate  30 each  2  . ibuprofen (ADVIL,MOTRIN) 600 MG tablet Take 1 tablet (600 mg total) by mouth every 6 (six) hours as needed.  30 tablet  2  . LORazepam (ATIVAN) 1 MG tablet Take 1 mg by mouth every 6 (six) hours as needed for anxiety.      . metoCLOPramide (REGLAN) 10 MG tablet Take 1 tablet (10 mg total) by mouth every 6  (six) hours as needed for nausea or vomiting.  30 tablet  3  . venlafaxine XR (EFFEXOR-XR) 37.5 MG 24 hr capsule Take 1 capsule (37.5 mg total) by mouth daily.  30 capsule  6   No current facility-administered medications for this encounter.    Physical Findings: The patient is in no acute distress. Patient is alert and oriented.  height is 5\' 7"  (1.702 m) and weight is 247 lb 9.6 oz (112.311 kg). Her temperature is 97.8 F (36.6 C). Her blood pressure is 134/69 and her pulse is 95. Marland Kitchen Pelvic exam and pap smear not performed in light of recent completion of treatment.  Lab Findings: Lab Results  Component Value Date   WBC 13.2* 04/01/2013   HGB 11.3* 04/01/2013   HCT 35.5* 04/01/2013   MCV 75.7* 04/01/2013   PLT 374 04/01/2013     Radiographic Findings: No results found.  Impression:  The patient is doing well at this time  Plan:  Routine followup in October. In the interim the patient will be seen by gynecologic oncology.  Today the patient was given a vaginal dilator and instructions on its use.  ____________________________________ Blair Promise, MD

## 2013-06-25 NOTE — Progress Notes (Signed)
Sheryl Mathis a size medium dilator per Dr. Sondra Come.  Advised her to apply a water based lubricant to the dilator and to use it every other day (Mondays, Wednesdays, Fridays) for 10 minutes.  Advised her to wash the dilator with soap and water.  Sheryl Mathis verbalized agreement.

## 2013-08-24 DIAGNOSIS — Z8542 Personal history of malignant neoplasm of other parts of uterus: Secondary | ICD-10-CM | POA: Insufficient documentation

## 2013-09-08 ENCOUNTER — Telehealth: Payer: Self-pay | Admitting: Gynecologic Oncology

## 2013-09-08 NOTE — Telephone Encounter (Signed)
Office note  Gyn-Onc  CC:  Endometrial cancer surveillance  Assessment/Plan:  [name]  is a 43 y.o.  year old status post laparoscopic hysterectomy bilateral salpingectomy by Dr. Harolyn Rutherford  for abnormal uterine bleeding with 2 prior endometrial sampling that were negative for malignancy. The specimen was morcellated into 4 pieces to facilitate removal from the vagina. 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 Based on the grade and lymphovascular space involvement is recommended that she undergo vaginal cuff brachytherapy.Treatment was completed May 21, 2013.   HPI: [name]  is a 43 y.o.G2 P2 last normal menstrual period in 11/03/2012. She reports abnormal uterine bleeding that began approximately 6 months ago. It became very heavy in September 2014 when she presented for evaluation and was initially placed on Megace.  12/01/12 ECC - BENIGN ENDOCERVICAL MUCOSA, - DETACHED FRAGMENTS OF SQUAMOUS EPITHELIUM; NEGATIVE FOR INTRAEPITHELIAL LESION OR MALIGANNCY. Endometrium, biopsy - FRAGMENTS OF ENDOMETRIOID-TYPE POLYP(S) WITH DECIDUALIZATION OF THE STROMA, CONSISTENT WITH HORMONE EFFECT. - THERE IS NO EVIDENCE OF HYPERPLASIA OR MALIGNANCY. Repeat biopsy on 01/02/2013 Endometrium, curettage - FRAGMENTS SUGGESTIVE OF ENDOMETRIOID TYPE POLYP(S) WITH DECIDUALIZATION OF THE STROMA, CONSISTENT WITH HORMONE EFFECT. - BENIGN ENDOCERVICAL TYPE MUCOSA. - THERE IS NO EVIDENCE OF HYPERPLASIA OR MALIGNANCY.  On 01/22/2013 she underwent a total laparoscopic hysterectomy unilateral salpingectomy with "some  vaginal morcellation to deliver the uterus vaginally after the hysterectomy was completed"  Uterus and bilateral fallopian tubes, with cervix ( 4  sections) Specimen: Uterus, cervix, one fallopian tube Procedure: Hysterectomy and salpingectomy Lymph node sampling performed: No Specimen integrity: Fragmented, please see gross description for details. Maximum tumor size: Estimated size 6.5 cm, grossly Histologic type: Invasive endometrioid carcinoma Grade: FIGO Grade II Myometrial invasion: 0.9 cm where myometrium is 2.4 cm in thickness Cervical stromal involvement: No Lymph - vascular invasion: Present, extensively FIGO Stage (based on pathologic findings, needs clinical correlation): At least IA Comment:  Sections show an invasive endometrioid carcinoma involving the upper portion of the uterus, estimated 6.5 cm in greatest dimension. There is extensive angiolymphatic invasion present. IHC Expression Result: MLH1: Preserved nuclear expression (greater 50% tumor expression) MSH2: Preserved nuclear expression (greater 50% tumor expression) MSH6: Preserved nuclear expression (greater 50% tumor expression) PMS2: Preserved nuclear expression (greater 50% tumor expression) * Internal control demonstrates intact nuclear expression  Discussion with Dr. Harolyn Rutherford confirmed that there was vaginal not intra-abdominal morcellation and that both fallopian tubes were removed, although only one was in the pathology specimen.  CT 12/17/2015to assess for nodal or intraperioneal disease notable for no retroperitoneal or retrocrural adenopathy. Normal colon, appendix, and terminal ileum. Normal small bowel without abdominal ascites. No evidence of omental  or peritoneal disease. No pelvic adenopathy. Residual normal appearing left ovary on image 72 and right ovary on image 76. No adnexal mass. There is mild pelvic fascia thickening and trace fluid which is presumably postoperative.   On 03/31/13 she underwent: Robotic bilateral oophorectomy, left salpingectomy, bilateral pelvic lymph node dissection, right PA node sampling   Pathology: Bilateral ovaries,  left fallopian tube, Right para-aortic lymph nodes, bilateral pelvic lymph nodes to pathology.   Operative findings: Status post hysterectomy with mild adhesive disease of the rectosigmoid colon to the left adnexa. Significant intra-abdominal obesity. Prominent bilateral pelvic lymph nodes.  Pathology revealed: Diagnosis 1. Ovary and  fallopian tube, left - BENIGN OVARY WITH CYSTIC FOLLICLES. - SURFACE FOREIGN BODY GIANT CELL REACTION. - THERE IS NO EVIDENCE OF MALIGNANCY. - SEE COMMENT. 2. Ovary and fallopian tube, right - BENIGN OVARY WITH CYSTIC FOLLICLES. - THERE IS NO EVIDENCE OF MALIGNANCY. - SEE COMMENT. 3. Lymph nodes, regional resection, right pelvic - THERE IS NO EVIDENCE OF CARCINOMA IN 10 OF 10 LYMPH NODES (0/10). 4. Lymph nodes, regional resection, left pelvic - THERE IS NO EVIDENCE OF CARCINOMA IN 7 OF 7 LYMPH NODES (0/7). 5. Lymph nodes, regional resection, right para-aortic - THERE IS NO EVIDENCE OF CARCINOMA IN 1 OF 1 LYMPH NODE (0/1).  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   Given extensive LVSI adjuvant bracytherapy was recommended. Radiation treatment dates: February 19th, February 25, March 5, March 12, May 21, 2013  Site/dose: Proximal vagina 30 Gy in 5 fractions  Beams/energy: Patient was treated with intracavitary brachytherapy treatments alone using iridium 192 as the high-dose-rate source. The patient was treated with a 3 cm diameter cylinder. Prescription was to the mucosal surface. Treatment length was 4 cm   Current Meds:  Outpatient Encounter Prescriptions as of 09/08/2013  Medication Sig  . ALPRAZolam (XANAX) 1 MG tablet Take 1 tablet (1 mg total) by mouth 3 (three) times daily as needed for anxiety.  Marland Kitchen amitriptyline (ELAVIL) 50 MG tablet Take 1 tablet (50 mg total) by mouth at bedtime.  . butalbital-acetaminophen-caffeine (FIORICET) 50-325-40 MG per tablet Take 1-2 tablets by mouth  every 6 (six) hours as needed for headache.  . docusate sodium (COLACE) 100 MG capsule Take 1 capsule (100 mg total) by mouth 2 (two) times daily as needed for constipation.  . Ferrous Sulfate (IRON) 28 MG TABS Take 1 tablet (28 mg total) by mouth 2 (two) times daily. 256 mg of ferrous gluconate  . ibuprofen (ADVIL,MOTRIN) 600 MG tablet Take 1 tablet (600 mg total) by mouth every 6 (six) hours as needed.  Marland Kitchen LORazepam (ATIVAN) 1 MG tablet Take 1 mg by mouth every 6 (six) hours as needed for anxiety.  . metoCLOPramide (REGLAN) 10 MG tablet Take 1 tablet (10 mg total) by mouth every 6 (six) hours as needed for nausea or vomiting.  . venlafaxine XR (EFFEXOR-XR) 37.5 MG 24 hr capsule Take 1 capsule (37.5 mg total) by mouth daily.    Allergy: No Known Allergies  Social Hx:   History   Social History  . Marital Status: Legally Separated    Spouse Name: N/A    Number of Children: N/A  . Years of Education: N/A   Occupational History  . Not on file.   Social History Main Topics  . Smoking status: Current Every Day Smoker -- 0.50 packs/day    Types: Cigarettes  . Smokeless tobacco: Never Used  . Alcohol Use: No  . Drug Use: No  . Sexual Activity: Not Currently    Birth Control/ Protection: Surgical   Other Topics Concern  . Not on file   Social History Narrative  . No narrative on file    Past Surgical Hx:  Past Surgical History  Procedure Laterality Date  . Cesarean section      X2  . Tubal ligation    . Dilitation & currettage/hystroscopy with hydrothermal ablation N/A 01/01/2013    Procedure: DILATATION & CURETTAGE/HYSTEROSCOPY WITH ATTEMPTED HYDROTHERMAL ABLATION: Change over to [place] @ 1505;  Surgeon: Osborne Oman, MD;  Location: [facility]  ORS;  Service: Gynecology;  Laterality: N/A;  . Laparoscopic assisted vaginal hysterectomy N/A 01/22/2013    Procedure: LAPAROSCOPIC ASSISTED VAGINAL HYSTERECTOMY with Bilateral Fallopian Tubes and Ovaries;  Surgeon: Osborne Oman, MD;  Location: [facility];  Service: Gynecology;  Laterality: N/A;  . Robotic assisted total hysterectomy with bilateral salpingo oopherectomy Bilateral 03/31/2013    Procedure: ROBOTIC ASSISTED BILATERAL OOPHORECTOMY PELVIC AND POSSIBLE PARA-AORTIC LYMPH NODE DISSECTION;  Surgeon: Imagene Gurney A. Alycia Rossetti, MD;  Location: [facility];  Service: Gynecology;  Laterality: Bilateral;    Past Medical Hx:  Past Medical History  Diagnosis Date  . Anemia   . Menorrhagia   . Depression   . Anxiety   . Headache(784.0)     MIGRAINES  . Cancer     ENDOMETRIAL CANCER  . Complication of anesthesia     PT STATES SHE WOKE UP AFTER D&C [place].  STATES NO PROBLEMS WAKING UP AFTER HER HYSTERECTOMY  . History of radiation therapy 04/23/13, 04/29/13, 05/07/13, 05/14/13, 05/21/13    30 Gy to proximal vagina    Past Gynecological History: Gravida 2 per 2 menarche at age 67 with regular menses until 6 months ago. Reports oral contraceptive pill use for 14 years in the remote past Patient's last menstrual period was 11/03/2012.  Family Hx:  Family History  Problem Relation Age of Onset  . Diabetes Mother   . Cancer Neg Hx     Vitals:  Blood pressure 151/107, pulse 100, temperature 98 F (36.7 C), temperature source Oral, resp. rate 18, weight 253 lb (114.76 kg), last menstrual period 11/03/2012.  Physical Exam: WD in NAD  Psychiatry: Alert and oriented to person, place, and time  Abdomen : [facility] surgical  sites intact without evidence of hernia or masses.     Janie Morning, MD, 09/08/2013, 10:58 AM

## 2013-09-10 ENCOUNTER — Ambulatory Visit: Payer: Self-pay | Attending: Gynecologic Oncology | Admitting: Gynecologic Oncology

## 2013-09-10 VITALS — BP 131/74 | HR 76 | Temp 98.5°F | Resp 18 | Ht 67.0 in | Wt 255.2 lb

## 2013-09-10 DIAGNOSIS — N951 Menopausal and female climacteric states: Secondary | ICD-10-CM

## 2013-09-10 DIAGNOSIS — C541 Malignant neoplasm of endometrium: Secondary | ICD-10-CM

## 2013-09-10 DIAGNOSIS — C549 Malignant neoplasm of corpus uteri, unspecified: Secondary | ICD-10-CM | POA: Insufficient documentation

## 2013-09-10 DIAGNOSIS — Z90710 Acquired absence of both cervix and uterus: Secondary | ICD-10-CM | POA: Insufficient documentation

## 2013-09-10 DIAGNOSIS — R55 Syncope and collapse: Secondary | ICD-10-CM

## 2013-09-10 NOTE — Progress Notes (Signed)
Office note  Gyn-Onc  CC:  Endometrial cancer surveillance, vasomotor instability  Assessment/Plan:  [name]  is a 43 y.o.  year old status post laparoscopic hysterectomy bilateral salpingectomy by Dr. Harolyn Rutherford  for abnormal uterine bleeding with 2 prior endometrial sampling that were negative for malignancy. The specimen was morcellated into 4 pieces to facilitate removal from the vagina. 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 Based on the grade and lymphovascular space involvement is recommended that she undergo vaginal cuff brachytherapy.Treatment was completed May 21, 2013. Follow-up with Gyn Onc in 6 months Follow-up with Dr. Sondra Come in 3 months  Vasomotor instability:  C/O hot flushes at night Patient will take Effexor at noon daily. If no improvement with Rx with Megace 29m daily Mammogram ordered.  HPI: [name] is a 43y.o.G2 P2 last normal menstrual period in 11/03/2012. She reports abnormal uterine bleeding that began approximately 6 months ago. It became very heavy in September 2014 when she presented for evaluation and was initially placed on Megace.  12/01/12 ECC - BENIGN ENDOCERVICAL MUCOSA, - DETACHED FRAGMENTS OF SQUAMOUS EPITHELIUM; NEGATIVE FOR INTRAEPITHELIAL LESION OR MALIGANNCY. Endometrium, biopsy - FRAGMENTS OF ENDOMETRIOID-TYPE POLYP(S) WITH DECIDUALIZATION OF THE STROMA, CONSISTENT WITH HORMONE EFFECT. - THERE IS NO EVIDENCE OF HYPERPLASIA OR MALIGNANCY. Repeat biopsy on 01/02/2013 Endometrium, curettage - FRAGMENTS SUGGESTIVE OF ENDOMETRIOID TYPE POLYP(S) WITH DECIDUALIZATION OF THE STROMA, CONSISTENT WITH HORMONE EFFECT. - BENIGN ENDOCERVICAL TYPE MUCOSA. - THERE IS NO EVIDENCE OF HYPERPLASIA OR  MALIGNANCY.  On 01/22/2013 she underwent a total laparoscopic hysterectomy unilateral salpingectomy with "some  vaginal morcellation to deliver the uterus vaginally after the hysterectomy was completed"  Uterus and bilateral fallopian tubes, with cervix ( 4 sections) Specimen: Uterus, cervix, one fallopian tube Procedure: Hysterectomy and salpingectomy Lymph node sampling performed: No Specimen integrity: Fragmented, please see gross description for details. Maximum tumor size: Estimated size 6.5 cm, grossly Histologic type: Invasive endometrioid carcinoma Grade: FIGO Grade II Myometrial invasion: 0.9 cm where myometrium is 2.4 cm in thickness Cervical stromal involvement: No Lymph - vascular invasion: Present, extensively FIGO Stage (based on pathologic findings, needs clinical correlation): At least IA Comment:  Sections show an invasive endometrioid carcinoma involving the upper portion of the uterus, estimated 6.5 cm in greatest dimension. There is extensive angiolymphatic invasion present. IHC Expression Result: MLH1: Preserved nuclear expression (greater 50% tumor expression) MSH2: Preserved nuclear expression (greater 50% tumor expression) MSH6: Preserved nuclear expression (greater 50% tumor expression) PMS2: Preserved nuclear expression (greater 50% tumor expression) * Internal control demonstrates intact nuclear expression  Discussion with Dr. AHarolyn Rutherfordconfirmed that there was vaginal not intra-abdominal morcellation and that both fallopian tubes were removed, although only one was in the pathology specimen.  CT 12/17/2015to assess for nodal or intraperioneal disease notable for no retroperitoneal or retrocrural adenopathy. Normal colon, appendix, and terminal ileum. Normal small bowel without abdominal ascites. No evidence of omental  or peritoneal disease. No pelvic adenopathy. Residual normal appearing left ovary on image 72 and right ovary on image 76. No adnexal mass. There is  mild pelvic fascia thickening and trace fluid which is presumably postoperative.   On 03/31/13 she underwent: Robotic bilateral oophorectomy, left salpingectomy, bilateral pelvic lymph node dissection, right PA node sampling   Pathology: Bilateral ovaries, left fallopian tube, Right para-aortic  lymph nodes, bilateral pelvic lymph nodes to pathology.   Operative findings: Status post hysterectomy with mild adhesive disease of the rectosigmoid colon to the left adnexa. Significant intra-abdominal obesity. Prominent bilateral pelvic lymph nodes.  Pathology revealed: Diagnosis 1. Ovary and fallopian tube, left - BENIGN OVARY WITH CYSTIC FOLLICLES. - SURFACE FOREIGN BODY GIANT CELL REACTION. - THERE IS NO EVIDENCE OF MALIGNANCY. - SEE COMMENT. 2. Ovary and fallopian tube, right - BENIGN OVARY WITH CYSTIC FOLLICLES. - THERE IS NO EVIDENCE OF MALIGNANCY. - SEE COMMENT. 3. Lymph nodes, regional resection, right pelvic - THERE IS NO EVIDENCE OF CARCINOMA IN 10 OF 10 LYMPH NODES (0/10). 4. Lymph nodes, regional resection, left pelvic - THERE IS NO EVIDENCE OF CARCINOMA IN 7 OF 7 LYMPH NODES (0/7). 5. Lymph nodes, regional resection, right para-aortic - THERE IS NO EVIDENCE OF CARCINOMA IN 1 OF 1 LYMPH NODE (0/1).  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   Given extensive LVSI adjuvant bracytherapy was recommended. Radiation treatment dates: February 19th, February 25, March 5, March 12, May 21, 2013  Site/dose: Proximal vagina 30 Gy in 5 fractions  Beams/energy: Patient was treated with intracavitary brachytherapy treatments alone using iridium 192 as the high-dose-rate source. The patient was treated with a 3 cm diameter cylinder. Prescription was to the mucosal surface. Treatment length was 4 cm   Current Meds:  Outpatient Encounter Prescriptions as of 09/10/2013  Medication Sig  . ALPRAZolam (XANAX) 1 MG tablet Take 1  tablet (1 mg total) by mouth 3 (three) times daily as needed for anxiety.  Marland Kitchen amitriptyline (ELAVIL) 50 MG tablet Take 1 tablet (50 mg total) by mouth at bedtime.  . butalbital-acetaminophen-caffeine (FIORICET) 50-325-40 MG per tablet Take 1-2 tablets by mouth every 6 (six) hours as needed for headache.  . docusate sodium (COLACE) 100 MG capsule Take 1 capsule (100 mg total) by mouth 2 (two) times daily as needed for constipation.  . Ferrous Sulfate (IRON) 28 MG TABS Take 1 tablet (28 mg total) by mouth 2 (two) times daily. 256 mg of ferrous gluconate  . ibuprofen (ADVIL,MOTRIN) 600 MG tablet Take 1 tablet (600 mg total) by mouth every 6 (six) hours as needed.  Marland Kitchen LORazepam (ATIVAN) 1 MG tablet Take 1 mg by mouth every 6 (six) hours as needed for anxiety.  . metoCLOPramide (REGLAN) 10 MG tablet Take 1 tablet (10 mg total) by mouth every 6 (six) hours as needed for nausea or vomiting.  . venlafaxine XR (EFFEXOR-XR) 37.5 MG 24 hr capsule Take 1 capsule (37.5 mg total) by mouth daily.    Allergy: No Known Allergies  Social Hx:   History   Social History  . Marital Status: Legally Separated    Spouse Name: N/A    Number of Children: N/A  . Years of Education: N/A   Occupational History  . Not on file.   Social History Main Topics  . Smoking status: Current Every Day Smoker -- 0.50 packs/day    Types: Cigarettes  . Smokeless tobacco: Never Used  . Alcohol Use: No  . Drug Use: No  . Sexual Activity: Not Currently    Birth Control/ Protection: Surgical   Other Topics Concern  . Not on file   Social History Narrative  . No narrative on file    Past Surgical Hx:  Past Surgical History  Procedure Laterality Date  . Cesarean section      X2  .  Tubal ligation    . Dilitation & currettage/hystroscopy with hydrothermal ablation N/A 01/01/2013    Procedure: DILATATION & CURETTAGE/HYSTEROSCOPY WITH ATTEMPTED HYDROTHERMAL ABLATION: Change over to [place] @ 1505;  Surgeon:  Osborne Oman, MD;  Location: [facility];  Service: Gynecology;  Laterality: N/A;  . Laparoscopic assisted vaginal hysterectomy N/A 01/22/2013    Procedure: LAPAROSCOPIC ASSISTED VAGINAL HYSTERECTOMY with Bilateral Fallopian Tubes and Ovaries;  Surgeon: Osborne Oman, MD;  Location: [facility];  Service: Gynecology;  Laterality: N/A;  . Robotic assisted total hysterectomy with bilateral salpingo oopherectomy Bilateral 03/31/2013    Procedure: ROBOTIC ASSISTED BILATERAL OOPHORECTOMY PELVIC AND POSSIBLE PARA-AORTIC LYMPH NODE DISSECTION;  Surgeon: Imagene Gurney A. Alycia Rossetti, MD;  Location: [facility];  Service: Gynecology;  Laterality: Bilateral;    Past Medical Hx:  Past Medical History  Diagnosis Date  . Anemia   . Menorrhagia   . Depression   . Anxiety   . Headache(784.0)     MIGRAINES  . Cancer     ENDOMETRIAL CANCER  . Complication of anesthesia     PT STATES SHE WOKE UP AFTER D&C [place].  STATES NO PROBLEMS WAKING UP AFTER HER HYSTERECTOMY  . History of radiation therapy 04/23/13, 04/29/13, 05/07/13, 05/14/13, 05/21/13    30 Gy to proximal vagina    Past Gynecological History: Gravida 2 per 2 menarche at age 53 with regular menses until 6 months ago. Reports oral contraceptive pill use for 14 years in the remote past Patient's last menstrual period was 11/03/2012.  Family Hx:  Family History  Problem Relation Age of Onset  . Diabetes Mother   . Cancer Neg Hx     Vitals:  Blood pressure 131/74, pulse 76, temperature 98.5 F (36.9 C), temperature source Oral, resp. rate 18, height '5\' 7"'  (1.702 m), weight 255 lb 3.2 oz (115.758 kg), last menstrual period 11/03/2012. Wt Readings from Last 3 Encounters:  09/10/13 255 lb 3.2 oz (115.758 kg)  06/25/13 247 lb 9.6 oz (112.311 kg)  05/20/13 253 lb (114.76 kg)     Physical Exam: WD in NAD  Chest:  CTA Cardiac:  RRR Psychiatry: Alert and oriented to person, place, and time  Abdomen : [facility] surgical  sites intact without  evidence of hernia or masses.  LN:  No cervical supraclavicular or inguinal adenopathy Back:  No CVAT Pelvic:  Nl EGBUS no masses bleeding or discharge Rectal:  Good tone no masses EXT:  No CCE   Janie Morning, MD, 09/10/2013, 9:38 AM

## 2013-09-10 NOTE — Patient Instructions (Addendum)
Stage IA grade 2 endometrioid adenocarcinoma Treatment was completed May 21, 2013. Follow-up with Gyn Onc in 6 months Follow-up with Dr. Sondra Come in 3 months    Thank you very much [name] for allowing me to provide care for you today.  I appreciate your confidence in choosing our Gynecologic Oncology team.  If you have any questions about your visit today please call our office and we will get back to you as soon as possible.  Please consider using the website Medlineplus.gov as an Geneticist, molecular.   Sheryl Mathis. Sheryl Siefert MD., PhD Gynecologic Oncology   Mammogram: Self pay at [place] $110.80 you can call to make appt [phone] Or [facility] through [facility] for mammogram assistance Nino Glow [phone] Call [place] [phone] to make appt

## 2013-10-14 ENCOUNTER — Other Ambulatory Visit: Payer: Self-pay | Admitting: Gynecologic Oncology

## 2013-10-14 DIAGNOSIS — Z1231 Encounter for screening mammogram for malignant neoplasm of breast: Secondary | ICD-10-CM

## 2013-10-19 ENCOUNTER — Ambulatory Visit (HOSPITAL_COMMUNITY)
Admission: RE | Admit: 2013-10-19 | Discharge: 2013-10-19 | Disposition: A | Discharge status: Home / Self Care | Payer: No Typology Code available for payment source | Source: Ambulatory Visit | Attending: Gynecologic Oncology | Admitting: Gynecologic Oncology

## 2013-10-19 DIAGNOSIS — Z1231 Encounter for screening mammogram for malignant neoplasm of breast: Secondary | ICD-10-CM

## 2013-10-21 ENCOUNTER — Other Ambulatory Visit: Payer: Self-pay | Admitting: Gynecologic Oncology

## 2013-10-21 DIAGNOSIS — R928 Other abnormal and inconclusive findings on diagnostic imaging of breast: Secondary | ICD-10-CM

## 2013-10-30 ENCOUNTER — Other Ambulatory Visit: Payer: Self-pay | Admitting: Obstetrics and Gynecology

## 2013-10-30 DIAGNOSIS — R928 Other abnormal and inconclusive findings on diagnostic imaging of breast: Secondary | ICD-10-CM

## 2013-11-20 ENCOUNTER — Other Ambulatory Visit: Payer: Self-pay

## 2013-12-01 ENCOUNTER — Encounter (HOSPITAL_COMMUNITY): Payer: Self-pay

## 2013-12-01 ENCOUNTER — Ambulatory Visit (HOSPITAL_COMMUNITY)
Admission: RE | Admit: 2013-12-01 | Discharge: 2013-12-01 | Disposition: A | Discharge status: Home / Self Care | Payer: No Typology Code available for payment source | Source: Ambulatory Visit | Attending: Obstetrics and Gynecology | Admitting: Obstetrics and Gynecology

## 2013-12-01 VITALS — BP 118/70 | Temp 98.0°F | Ht 67.0 in | Wt 266.4 lb

## 2013-12-01 DIAGNOSIS — Z1239 Encounter for other screening for malignant neoplasm of breast: Secondary | ICD-10-CM

## 2013-12-01 NOTE — Patient Instructions (Addendum)
Sheryl Mathis that she needs to follow the guidelines for Pap smears given by Gynecologic Oncology at the [facility]. Referred patient to the [facility] for left breast diagnostic mammogram per recommendation. Appointment scheduled for Wednesday, December 02, 2013 at 1530. Patient aware of appointment and will be there. Smoking cessation discussed with patient and resources given. [name] verbalized understanding.  Laurie Penado, Arvil Chaco, RN 1:46 PM

## 2013-12-01 NOTE — Progress Notes (Addendum)
No complaints today. Patient referred to [facility] by the [facility] due to recommending additional imaging of the left breast. Screening mammogram completed   Pap Smear:  Pap smear not completed today. Last Pap smear was in November 2014 and normal per patient. Per patient has no history of an abnormal Pap smear. Patient has a history of endometrial cancer that a complete hysterectomy was completed in November 2014. Last Pap smear result is not in EPIC.  Physical exam: Breasts Breasts symmetrical. No skin abnormalities bilateral breasts. Folliculitis observed in patients right axilla. Per patient has been a chronic problem. No nipple retraction bilateral breasts. No nipple discharge bilateral breasts. No lymphadenopathy. No lumps palpated bilateral breasts. No complaints of pain or tenderness on exam. Referred patient to the [facility] for left breast diagnostic mammogram per recommendation. Appointment scheduled for Wednesday, December 02, 2013 at 1530.      Pelvic/Bimanual No Pap smear completed today since last Pap smear was in November 2014 per patient. Pap smear not indicated per BCCCP guidelines.   Smoking cessation discussed with patient and resources given.

## 2013-12-02 ENCOUNTER — Ambulatory Visit
Admission: RE | Admit: 2013-12-02 | Discharge: 2013-12-02 | Disposition: A | Discharge status: Home / Self Care | Payer: No Typology Code available for payment source | Source: Ambulatory Visit | Attending: Obstetrics and Gynecology | Admitting: Obstetrics and Gynecology

## 2013-12-02 DIAGNOSIS — R928 Other abnormal and inconclusive findings on diagnostic imaging of breast: Secondary | ICD-10-CM

## 2013-12-04 ENCOUNTER — Ambulatory Visit (HOSPITAL_BASED_OUTPATIENT_CLINIC_OR_DEPARTMENT_OTHER): Payer: No Typology Code available for payment source

## 2013-12-04 ENCOUNTER — Ambulatory Visit: Payer: No Typology Code available for payment source

## 2013-12-04 VITALS — BP 120/62 | HR 77 | Temp 98.7°F | Resp 16 | Ht 66.24 in | Wt 265.3 lb

## 2013-12-04 DIAGNOSIS — E119 Type 2 diabetes mellitus without complications: Secondary | ICD-10-CM

## 2013-12-04 DIAGNOSIS — Z0000 Encounter for general adult medical examination without abnormal findings: Secondary | ICD-10-CM

## 2013-12-04 LAB — LIPID PANEL
CHOL/HDL RATIO: 5.4 ratio
Cholesterol: 265 mg/dL — ABNORMAL HIGH (ref 0–200)
HDL: 49 mg/dL (ref >39)
LDL CALC: 185 mg/dL — AB (ref 0–99)
Triglycerides: 154 mg/dL — ABNORMAL HIGH (ref <150)
VLDL: 31 mg/dL (ref 0–40)

## 2013-12-04 LAB — HEMOGLOBIN A1C
Hgb A1c MFr Bld: 6.1 % — ABNORMAL HIGH (ref <5.7)
Mean Plasma Glucose: 128 mg/dL — ABNORMAL HIGH (ref <117)

## 2013-12-04 LAB — GLUCOSE (CC13): Glucose: 96 mg/dl (ref 70–140)

## 2013-12-04 NOTE — Progress Notes (Signed)
Patient is a new patient to the [facility] Wise woman program and is currently a [facility] CCP patient effective 12/01/2013 .  Clinical Measurements: Patient is 5 ft. 6 1/4 inches, weight 265.3 lbs, waist circumference 47 inches, and hip circumference 50.inches.   Medical History: Patient has no history of high cholesterol. Patient does not have a history of hypertension or diabetes. Per patient no diagnosed history of coronary heart disease, heart attack, heart failure, stroke/TIA, vascular disease or congenital heart defects.   Blood Pressure, Self-measurement: Patient states has no reason to check Blood pressure.  Nutrition Assessment: Patient stated that eats 2 fruits every day. Patient states she eats six servings of vegetables a day.Per patient eats 3 or more ounces of whole grains daily. Patient doesn't eat two or more servings of fish weekly. Patient states eats only once a week. Patient states she does not drink more than 36 ounces or 450 calories of beverages with added sugars weekly. Patient states she drinks a lot of water. Patient stated she does watch her salt intake.   Physical Activity Assessment: Patient stated she does around 420 minutes of moderate activity and no vigorous activity. Per Patient has gym membership but has not been motivated.  Smoking Status: Patient states that currently smoke 1/2 pack cigarettes a day.Patient wants to sign up for class.  Quality of Life Assessment: In assessing patient's quality of life she stated that out of the past 30 days that she has felt her health is good all of them. Patient also stated that in the past 30 days that her mental health is not good including stress, depression and problems with emotions for 7 days. Patient did state that out of the past 30 days she felt her physical or mental health had not kept her from doing her usual activities including self-care, work or recreation.   Plan: Lab work will be done today including a lipid panel, blood  glucose, and Hgb A1C. Will call lab results when they are finished. Patient wants to go to [employer]. Gave patient pamphlets for [facility]  and [employer] program for Survivors. Call [place] [employer]uit Line and attend Quit Smart classes starting October 19th.

## 2013-12-04 NOTE — Patient Instructions (Signed)
Discussed health assessment with patient. Talked with patient about smoking cessation and gave resources. Call [place] Quit line is need and doctor about anti-depressent. Let patient know that will call her on Monday with lab results and let Mr Alroy Dust know results if needed. Will follow up with [employer]. Patient verbalized understanding.

## 2013-12-07 ENCOUNTER — Telehealth: Payer: Self-pay

## 2013-12-07 NOTE — Telephone Encounter (Signed)
Left message to return call for labs or will call back.

## 2013-12-08 ENCOUNTER — Telehealth (HOSPITAL_COMMUNITY): Payer: Self-pay

## 2013-12-08 NOTE — Telephone Encounter (Signed)
Called to inform about lab work from 12/04/13. Interpreter informed patient: cholesterol- 265, HDL- 49, LDL- 185, triglycerides - 1154, Bld Glucose -96 and HBG-A1C - 6.1. Patient is starting [employer] and has vouchers. I discussed the importance of decrease carbohydrates and sugars. Informed patient that needs to change over to artifical sweeteners, decrease starches and sugary products. Explained that starches break down into sugar. Gave patient the values of carbohydrates per food group and needed to consume less than 200 GM of carbohydrates a day. Discussed with patient that needed to have HBG A1C rechecked in 3 to 6 months. Will call Dr. Alroy Dust and send results.  PLAN: Call patient in three weeks to follow up.

## 2013-12-17 ENCOUNTER — Other Ambulatory Visit (HOSPITAL_COMMUNITY)
Admission: RE | Admit: 2013-12-17 | Discharge: 2013-12-17 | Disposition: A | Discharge status: Home / Self Care | Payer: No Typology Code available for payment source | Source: Ambulatory Visit | Attending: Radiation Oncology | Admitting: Radiation Oncology

## 2013-12-17 ENCOUNTER — Encounter: Payer: Self-pay | Admitting: Radiation Oncology

## 2013-12-17 ENCOUNTER — Ambulatory Visit
Admission: RE | Admit: 2013-12-17 | Discharge: 2013-12-17 | Disposition: A | Discharge status: Home / Self Care | Payer: No Typology Code available for payment source | Source: Ambulatory Visit | Attending: Radiation Oncology | Admitting: Radiation Oncology

## 2013-12-17 VITALS — BP 112/62 | HR 78 | Temp 98.1°F | Resp 20 | Ht 66.5 in | Wt 262.1 lb

## 2013-12-17 DIAGNOSIS — Z1151 Encounter for screening for human papillomavirus (HPV): Secondary | ICD-10-CM | POA: Insufficient documentation

## 2013-12-17 DIAGNOSIS — R87810 Cervical high risk human papillomavirus (HPV) DNA test positive: Secondary | ICD-10-CM | POA: Insufficient documentation

## 2013-12-17 DIAGNOSIS — C541 Malignant neoplasm of endometrium: Secondary | ICD-10-CM

## 2013-12-17 DIAGNOSIS — Z01411 Encounter for gynecological examination (general) (routine) with abnormal findings: Secondary | ICD-10-CM | POA: Insufficient documentation

## 2013-12-17 NOTE — Progress Notes (Signed)
Sheryl Mathis here for follow up after brachytherapy.  She denies pain.  She reports urinating frequently due to drinking water more frequently.  She denies bowel issues, vaginal/rectal bleeding, nausea and fatigue.  She has joined [employer] and has lost 3 lbs.  She is also trying to quit smoking.  She is wondering if she can get a prescription for Wellbutrin.  She reports still feeling down.  She is taking Effexor.  She reports having tingling in her left upper thigh that she has had since her surgery.

## 2013-12-17 NOTE — Progress Notes (Signed)
Radiation Oncology         [phone] ________________________________  Name: Sheryl Mathis MRN: [id number]  Date: 12/17/2013  DOB: [birth year]  Follow-Up Visit Note  CC: No primary provider on file.  [name], MD    ICD-9-CM ICD-10-CM  1. 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  She was seen by gynecologic oncology 3 months ago with good report. Patient denies any pelvic pain vaginal bleeding rectal bleeding or hematuria. She is using her vaginal dilator intermittently. She denies any bleeding with this procedure. Patient does wish to stop smoking and will discuss intervention with her primary care physician. Patient has joined [employer] and has lost approximately 3 pounds since her treatment.  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  Meds: Current Outpatient Prescriptions  Medication Sig Dispense Refill  . ibuprofen (ADVIL,MOTRIN) 600 MG tablet Take 1 tablet (600 mg total) by mouth every 6 (six) hours as needed.  30 tablet  2  . venlafaxine XR (EFFEXOR-XR) 37.5 MG 24 hr capsule Take 1 capsule (37.5 mg total) by mouth daily.  30 capsule  6  . ALPRAZolam (XANAX) 1 MG tablet Take 1 tablet (1 mg total) by mouth 3 (three) times daily as needed for anxiety.  90 tablet  5  . amitriptyline (ELAVIL) 50 MG tablet Take 1 tablet (50 mg total) by mouth at bedtime.  30 tablet  5  . butalbital-acetaminophen-caffeine (FIORICET) 50-325-40 MG per tablet Take 1-2 tablets by mouth every 6 (six) hours as needed for headache.  30 tablet  0  . docusate sodium (COLACE) 100 MG capsule Take 1 capsule (100 mg total) by mouth 2 (two) times daily as needed for  constipation.  30 capsule  2  . Ferrous Sulfate (IRON) 28 MG TABS Take 1 tablet (28 mg total) by mouth 2 (two) times daily. 256 mg of ferrous gluconate  30 each  2  . LORazepam (ATIVAN) 1 MG tablet Take 1 mg by mouth every 6 (six) hours as needed for anxiety.      . metoCLOPramide (REGLAN) 10 MG tablet Take 1 tablet (10 mg total) by mouth every 6 (six) hours as needed for nausea or vomiting.  30 tablet  3   No current facility-administered medications for this encounter.    Physical Findings: The patient is in no acute distress. Patient is alert and oriented.  height is 5' 6.5" (1.689 m) and weight is 262 lb 1.6 oz (118.888 kg). Her oral temperature is 98.1 F (36.7 C). Her blood pressure is 112/62 and her pulse is 78. Her respiration is 20. Marland Kitchen  No palpable subclavicular or axillary adenopathy. The lungs are clear to auscultation. The heart has regular rhythm and rate. The abdomen is soft and nontender with normal bowel sounds. No inguinal adenopathy is appreciated. On pelvic examination the external genitalia are unremarkable. Speculum exam is performed. No mucosal lesions noted in the vaginal vault. A Pap smear was obtained of the proximal vagina. On bimanual and rectovaginal examination there no pelvic masses appreciated.  Lab Findings: Lab  Results  Component Value Date   WBC 13.2* 04/01/2013   HGB 11.3* 04/01/2013   HCT 35.5* 04/01/2013   MCV 75.7* 04/01/2013   PLT 374 04/01/2013    Radiographic Findings: Mm Digital Diagnostic Unilat L  12/02/2013   CLINICAL DATA:  Screening recall for a left breast asymmetry seen on the CC view only.  EXAM: DIGITAL DIAGNOSTIC  LEFT MAMMOGRAM WITH CAD  COMPARISON:  Screening mammogram dated 10/19/2013.  ACR Breast Density Category b: There are scattered areas of fibroglandular density.  FINDINGS: The initially questioned asymmetry seen in the central posterior left breast on the CC view only resolves on the additional spot compression CC view with no correlate  seen on today's true lateral view or the initial MLO view. Findings are consistent with superimposed breast tissue. There is no mammographic evidence of malignancy in either breast.  Mammographic images were processed with CAD.  IMPRESSION: Initially questioned left breast asymmetry resolves on the additional imaging compatible with superimposed breast tissue. There is no mammographic evidence of malignancy in the left breast.  RECOMMENDATION: Screening mammogram in one year.(Code:[id number]-01Y)  I have discussed the findings and recommendations with the patient. Results were also provided in writing at the conclusion of the visit. If applicable, a reminder letter will be sent to the patient regarding the next appointment.  BI-RADS CATEGORY  1: Negative.   Electronically Signed   By: Everlean Alstrom M.D.   On: 12/02/2013 15:48    Impression:  No evidence of recurrence on clinical exam today, Pap smear pending  Plan:  Routine followup in radiation oncology in 6 months. In the interim the patient will be seen by gynecologic oncology.  ____________________________________ Blair Promise, MD

## 2013-12-18 LAB — CYTOLOGY - PAP

## 2013-12-31 ENCOUNTER — Telehealth: Payer: Self-pay | Admitting: *Deleted

## 2013-12-31 NOTE — Telephone Encounter (Signed)
Pt called states " Dr. Sondra Come said to call you about my pap smear results" Reviewed results with NP, returned pt's call, lmovm pap smear has low grade changes additional tests being run, will call with additional information after these results have been reviewed by Dr. Skeet Latch. Request pt call office to confirm message received.

## 2014-01-04 ENCOUNTER — Encounter: Payer: Self-pay | Admitting: Radiation Oncology

## 2014-01-19 ENCOUNTER — Telehealth: Payer: Self-pay | Admitting: Gynecologic Oncology

## 2014-01-19 NOTE — Telephone Encounter (Signed)
Patient notified of Dr. Leone Brand recommendations for colpo.  Appointment made.  Advised to call for any questions or concerns.

## 2014-01-31 ENCOUNTER — Telehealth: Payer: Self-pay | Admitting: Gynecologic Oncology

## 2014-01-31 NOTE — Telephone Encounter (Signed)
Office note  Gyn-Onc  CC:  Endometrial cancer surveillance, Abnormal pap  Assessment/Plan:  [name]  is a 43 y.o.  year old status post laparoscopic hysterectomy bilateral salpingectomy by Dr. Harolyn Mathis  for abnormal uterine bleeding with 2 prior endometrial sampling that were negative for malignancy. The specimen was morcellated into 4 pieces to facilitate removal from the vagina. 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 Based on the grade and lymphovascular space involvement is recommended that she undergo vaginal cuff brachytherapy.Treatment was completed May 21, 2013. Follow-up with Gyn Onc in 6 months  Pap LSIL +hr HPV DNA Colposcopy preformed today   HPI: [name]  is a 43 y.o.G2 P2 last normal menstrual period in 11/03/2012. She reports abnormal uterine bleeding that began approximately 6 months ago. It became very heavy in September 2014 when she presented for evaluation and was initially placed on Megace.  12/01/12 ECC - BENIGN ENDOCERVICAL MUCOSA, - DETACHED FRAGMENTS OF SQUAMOUS EPITHELIUM; NEGATIVE FOR INTRAEPITHELIAL LESION OR MALIGANNCY. Endometrium, biopsy - FRAGMENTS OF ENDOMETRIOID-TYPE POLYP(S) WITH DECIDUALIZATION OF THE STROMA, CONSISTENT WITH HORMONE EFFECT. - THERE IS NO EVIDENCE OF HYPERPLASIA OR MALIGNANCY. Repeat biopsy on 01/02/2013 Endometrium, curettage - FRAGMENTS SUGGESTIVE OF ENDOMETRIOID TYPE POLYP(S) WITH DECIDUALIZATION OF THE STROMA, CONSISTENT WITH HORMONE EFFECT. - BENIGN ENDOCERVICAL TYPE MUCOSA. - THERE IS NO EVIDENCE OF HYPERPLASIA OR MALIGNANCY.  On 01/22/2013 she underwent a total laparoscopic hysterectomy unilateral salpingectomy with "some  vaginal morcellation to deliver the uterus  vaginally after the hysterectomy was completed"  Uterus and bilateral fallopian tubes, with cervix ( 4 sections) Specimen: Uterus, cervix, one fallopian tube Procedure: Hysterectomy and salpingectomy Lymph node sampling performed: No Specimen integrity: Fragmented, please see gross description for details. Maximum tumor size: Estimated size 6.5 cm, grossly Histologic type: Invasive endometrioid carcinoma Grade: FIGO Grade II Myometrial invasion: 0.9 cm where myometrium is 2.4 cm in thickness Cervical stromal involvement: No Lymph - vascular invasion: Present, extensively FIGO Stage (based on pathologic findings, needs clinical correlation): At least IA Comment:  Sections show an invasive endometrioid carcinoma involving the upper portion of the uterus, estimated 6.5 cm in greatest dimension. There is extensive angiolymphatic invasion present. IHC Expression Result: MLH1: Preserved nuclear expression (greater 50% tumor expression) MSH2: Preserved nuclear expression (greater 50% tumor expression) MSH6: Preserved nuclear expression (greater 50% tumor expression) PMS2: Preserved nuclear expression (greater 50% tumor expression) * Internal control demonstrates intact nuclear expression  Discussion with Dr. Harolyn Mathis confirmed that there was vaginal not intra-abdominal morcellation and that both fallopian tubes were removed, although only one was in the pathology specimen.  CT 12/17/2015to assess for nodal or intraperioneal disease notable for no retroperitoneal or retrocrural adenopathy. Normal colon, appendix, and terminal ileum. Normal small bowel without abdominal ascites. No evidence of omental  or peritoneal disease. No pelvic adenopathy. Residual normal appearing left ovary on image 72 and right ovary on image 76. No adnexal mass. There is mild pelvic fascia thickening and trace fluid which is presumably postoperative.   On 03/31/13 she underwent: Robotic bilateral oophorectomy, left  salpingectomy, bilateral pelvic lymph node dissection, right PA node sampling   Pathology: Bilateral ovaries, left fallopian tube, Right para-aortic lymph nodes, bilateral pelvic lymph nodes to pathology.   Operative findings: Status post hysterectomy with mild adhesive disease of the rectosigmoid colon to  the left adnexa. Significant intra-abdominal obesity. Prominent bilateral pelvic lymph nodes.  Pathology revealed: Diagnosis 1. Ovary and fallopian tube, left - BENIGN OVARY WITH CYSTIC FOLLICLES. - SURFACE FOREIGN BODY GIANT CELL REACTION. - THERE IS NO EVIDENCE OF MALIGNANCY. - SEE COMMENT. 2. Ovary and fallopian tube, right - BENIGN OVARY WITH CYSTIC FOLLICLES. - THERE IS NO EVIDENCE OF MALIGNANCY. - SEE COMMENT. 3. Lymph nodes, regional resection, right pelvic - THERE IS NO EVIDENCE OF CARCINOMA IN 10 OF 10 LYMPH NODES (0/10). 4. Lymph nodes, regional resection, left pelvic - THERE IS NO EVIDENCE OF CARCINOMA IN 7 OF 7 LYMPH NODES (0/7). 5. Lymph nodes, regional resection, right para-aortic - THERE IS NO EVIDENCE OF CARCINOMA IN 1 OF 1 LYMPH NODE (0/1).  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   Given extensive LVSI adjuvant bracytherapy was recommended. Radiation treatment dates: February 19th, February 25, March 5, March 12, May 21, 2013  Site/dose: Proximal vagina 30 Gy in 5 fractions  Beams/energy: Patient was treated with intracavitary brachytherapy treatments alone using iridium 192 as the high-dose-rate source. The patient was treated with a 3 cm diameter cylinder. Prescription was to the mucosal surface. Treatment length was 4 cm  Pap 12/27/2013 LSIL +hrHPV DNA Present today for colposcopy  Social Hx:   History   Social History  . Marital Status: Divorced    Spouse Name: N/A    Number of Children: N/A  . Years of Education: N/A   Occupational History  . Not on file.   Social History Main  Topics  . Smoking status: Current Every Day Smoker -- 0.50 packs/day    Types: Cigarettes  . Smokeless tobacco: Never Used  . Alcohol Use: No  . Drug Use: No  . Sexual Activity: Not Currently    Birth Control/ Protection: Surgical   Other Topics Concern  . Not on file   Social History Narrative    Past Surgical Hx:  Past Surgical History  Procedure Laterality Date  . Cesarean section      X2  . Tubal ligation    . Dilitation & currettage/hystroscopy with hydrothermal ablation N/A 01/01/2013    Procedure: DILATATION & CURETTAGE/HYSTEROSCOPY WITH ATTEMPTED HYDROTHERMAL ABLATION: Change over to [place] @ 1505;  Surgeon: Osborne Oman, MD;  Location: [facility];  Service: Gynecology;  Laterality: N/A;  . Laparoscopic assisted vaginal hysterectomy N/A 01/22/2013    Procedure: LAPAROSCOPIC ASSISTED VAGINAL HYSTERECTOMY with Bilateral Fallopian Tubes and Ovaries;  Surgeon: Osborne Oman, MD;  Location: [facility];  Service: Gynecology;  Laterality: N/A;  . Robotic assisted total hysterectomy with bilateral salpingo oopherectomy Bilateral 03/31/2013    Procedure: ROBOTIC ASSISTED BILATERAL OOPHORECTOMY PELVIC AND POSSIBLE PARA-AORTIC LYMPH NODE DISSECTION;  Surgeon: Imagene Gurney A. Alycia Rossetti, MD;  Location: [facility];  Service: Gynecology;  Laterality: Bilateral;    Past Medical Hx:  Past Medical History  Diagnosis Date  . Anemia   . Menorrhagia   . Depression   . Anxiety   . Headache(784.0)     MIGRAINES  . Cancer     ENDOMETRIAL CANCER  . Complication of anesthesia     PT STATES SHE WOKE UP AFTER D&C [place].  STATES NO PROBLEMS WAKING UP AFTER HER HYSTERECTOMY  . History of radiation therapy 04/23/13, 04/29/13, 05/07/13, 05/14/13, 05/21/13    30 Gy to proximal vagina    Past Gynecological History: Gravida 2 per 2 menarche  at age 69 with regular menses until 6 months ago. Reports oral contraceptive pill use for 14 years in the remote past Patient's last  menstrual period was 11/03/2012.  Family Hx:  Family History  Problem Relation Age of Onset  . Diabetes Mother   . Hypertension Mother   . Cancer Neg Hx     Vitals:  Blood pressure 131/74, pulse 76, temperature 98.5 F (36.9 C), temperature source Oral, resp. rate 18, height _0  (1.702 m), weight 255 lb 3.2 oz (115.758 kg), last menstrual period 11/03/2012. Wt Readings from Last 3 Encounters:  12/04/13 265 lb 4.8 oz (120.339 kg)  12/01/13 266 lb 6.4 oz (120.838 kg)  09/10/13 255 lb 3.2 oz (115.758 kg)     Physical Exam: WD in NAD  Chest:  CTA Cardiac:  RRR Psychiatry: Alert and oriented to person, place, and time  Abdomen : [facility] surgical  sites intact without evidence of hernia or masses.  LN:  No cervical supraclavicular or inguinal adenopathy Back:  No CVAT Pelvic:  Nl EGBUS no masses bleeding or discharge Rectal:  Good tone no masses EXT:  No CCE

## 2014-02-01 ENCOUNTER — Other Ambulatory Visit (HOSPITAL_COMMUNITY)
Admission: RE | Admit: 2014-02-01 | Discharge: 2014-02-01 | Disposition: A | Discharge status: Home / Self Care | Payer: No Typology Code available for payment source | Source: Ambulatory Visit | Attending: Gynecologic Oncology | Admitting: Gynecologic Oncology

## 2014-02-01 ENCOUNTER — Other Ambulatory Visit: Payer: Self-pay | Admitting: *Deleted

## 2014-02-01 ENCOUNTER — Encounter: Payer: Self-pay | Admitting: Gynecologic Oncology

## 2014-02-01 ENCOUNTER — Ambulatory Visit: Payer: Self-pay | Attending: Gynecologic Oncology | Admitting: Gynecologic Oncology

## 2014-02-01 VITALS — BP 135/71 | HR 73 | Temp 98.2°F | Resp 20 | Ht 66.5 in | Wt 243.1 lb

## 2014-02-01 DIAGNOSIS — Z90722 Acquired absence of ovaries, bilateral: Secondary | ICD-10-CM | POA: Insufficient documentation

## 2014-02-01 DIAGNOSIS — C541 Malignant neoplasm of endometrium: Secondary | ICD-10-CM | POA: Insufficient documentation

## 2014-02-01 DIAGNOSIS — IMO0002 Reserved for concepts with insufficient information to code with codable children: Secondary | ICD-10-CM

## 2014-02-01 DIAGNOSIS — Z9079 Acquired absence of other genital organ(s): Secondary | ICD-10-CM | POA: Insufficient documentation

## 2014-02-01 DIAGNOSIS — Z90710 Acquired absence of both cervix and uterus: Secondary | ICD-10-CM | POA: Insufficient documentation

## 2014-02-01 DIAGNOSIS — R87612 Low grade squamous intraepithelial lesion on cytologic smear of cervix (LGSIL): Secondary | ICD-10-CM | POA: Insufficient documentation

## 2014-02-01 DIAGNOSIS — F17210 Nicotine dependence, cigarettes, uncomplicated: Secondary | ICD-10-CM | POA: Insufficient documentation

## 2014-02-01 DIAGNOSIS — E669 Obesity, unspecified: Secondary | ICD-10-CM

## 2014-02-01 DIAGNOSIS — R87810 Cervical high risk human papillomavirus (HPV) DNA test positive: Secondary | ICD-10-CM | POA: Insufficient documentation

## 2014-02-01 DIAGNOSIS — M792 Neuralgia and neuritis, unspecified: Secondary | ICD-10-CM

## 2014-02-01 MED ORDER — AMITRIPTYLINE HCL 25 MG PO TABS: 25.0000 mg | ORAL_TABLET | Freq: Every day | ORAL | Status: DC

## 2014-02-01 MED ORDER — IBUPROFEN 800 MG PO TABS: 800.0000 mg | ORAL_TABLET | Freq: Three times a day (TID) | ORAL | Status: DC | PRN

## 2014-02-01 NOTE — Patient Instructions (Signed)
We will contact you with the results of your pap smear from today.  Please call for any questions or concerns.  Plan to see Dr. Sondra Come in March 2016

## 2014-02-01 NOTE — Progress Notes (Signed)
Office note  Gyn-Onc  CC:  Endometrial cancer surveillance, Abnormal pap +hrHPVDNA  Assessment/Plan:  [name]  is a 43 y.o.  year old status post laparoscopic hysterectomy bilateral salpingectomy by Dr. Harolyn Rutherford  for abnormal uterine bleeding with 2 prior endometrial sampling that were negative for malignancy. The specimen was morcellated into 4 pieces to facilitate removal from the vagina. 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 Based on the grade and lymphovascular space involvement is recommended that she undergo vaginal cuff brachytherapy.Treatment was completed May 21, 2013. Follow-up with Gyn Onc in 6 months Follow-up with Dr. Sondra Come in 3 months  Pap LSIL +hr HPV DNA Colposcopy  And vaginal biopsy performed today Will call with results   HPI: [name]  is a 43 y.o.G2 P2 last normal menstrual period in 11/03/2012. She reports abnormal uterine bleeding that began approximately 6 months ago. It became very heavy in September 2014 when she presented for evaluation and was initially placed on Megace.  12/01/12 ECC - BENIGN ENDOCERVICAL MUCOSA, - DETACHED FRAGMENTS OF SQUAMOUS EPITHELIUM; NEGATIVE FOR INTRAEPITHELIAL LESION OR MALIGANNCY. Endometrium, biopsy - FRAGMENTS OF ENDOMETRIOID-TYPE POLYP(S) WITH DECIDUALIZATION OF THE STROMA, CONSISTENT WITH HORMONE EFFECT. - THERE IS NO EVIDENCE OF HYPERPLASIA OR MALIGNANCY. Repeat biopsy on 01/02/2013 Endometrium, curettage - FRAGMENTS SUGGESTIVE OF ENDOMETRIOID TYPE POLYP(S) WITH DECIDUALIZATION OF THE STROMA, CONSISTENT WITH HORMONE EFFECT. - BENIGN ENDOCERVICAL TYPE MUCOSA. - THERE IS NO EVIDENCE OF HYPERPLASIA OR MALIGNANCY.  On 01/22/2013 she underwent a total laparoscopic  hysterectomy unilateral salpingectomy with "some  vaginal morcellation to deliver the uterus vaginally after the hysterectomy was completed"  Uterus and bilateral fallopian tubes, with cervix ( 4 sections) Specimen: Uterus, cervix, one fallopian tube Procedure: Hysterectomy and salpingectomy Lymph node sampling performed: No Specimen integrity: Fragmented, please see gross description for details. Maximum tumor size: Estimated size 6.5 cm, grossly Histologic type: Invasive endometrioid carcinoma Grade: FIGO Grade II Myometrial invasion: 0.9 cm where myometrium is 2.4 cm in thickness Cervical stromal involvement: No Lymph - vascular invasion: Present, extensively FIGO Stage (based on pathologic findings, needs clinical correlation): At least IA Comment:  Sections show an invasive endometrioid carcinoma involving the upper portion of the uterus, estimated 6.5 cm in greatest dimension. There is extensive angiolymphatic invasion present. IHC Expression Result: MLH1: Preserved nuclear expression (greater 50% tumor expression) MSH2: Preserved nuclear expression (greater 50% tumor expression) MSH6: Preserved nuclear expression (greater 50% tumor expression) PMS2: Preserved nuclear expression (greater 50% tumor expression) * Internal control demonstrates intact nuclear expression  Discussion with Dr. Harolyn Rutherford confirmed that there was vaginal not intra-abdominal morcellation and that both fallopian tubes were removed, although only one was in the pathology specimen.  CT 12/17/2015to assess for nodal or intraperioneal disease notable for no retroperitoneal or retrocrural adenopathy. Normal colon, appendix, and terminal ileum. Normal small bowel without abdominal ascites. No evidence of omental  or peritoneal disease. No pelvic adenopathy. Residual normal appearing left ovary on image 72 and right ovary on image 76. No adnexal mass. There is mild pelvic fascia thickening and trace fluid which is  presumably postoperative.   On 03/31/13 she underwent: Robotic bilateral oophorectomy, left salpingectomy, bilateral pelvic lymph node dissection, right PA node sampling   Pathology: Bilateral ovaries, left fallopian tube, Right para-aortic lymph nodes, bilateral pelvic lymph nodes to pathology.  Operative findings: Status post hysterectomy with mild adhesive disease of the rectosigmoid colon to the left adnexa. Significant intra-abdominal obesity. Prominent bilateral pelvic lymph nodes.  Pathology revealed: Diagnosis 1. Ovary and fallopian tube, left - BENIGN OVARY WITH CYSTIC FOLLICLES. - SURFACE FOREIGN BODY GIANT CELL REACTION. - THERE IS NO EVIDENCE OF MALIGNANCY. - SEE COMMENT. 2. Ovary and fallopian tube, right - BENIGN OVARY WITH CYSTIC FOLLICLES. - THERE IS NO EVIDENCE OF MALIGNANCY. - SEE COMMENT. 3. Lymph nodes, regional resection, right pelvic - THERE IS NO EVIDENCE OF CARCINOMA IN 10 OF 10 LYMPH NODES (0/10). 4. Lymph nodes, regional resection, left pelvic - THERE IS NO EVIDENCE OF CARCINOMA IN 7 OF 7 LYMPH NODES (0/7). 5. Lymph nodes, regional resection, right para-aortic - THERE IS NO EVIDENCE OF CARCINOMA IN 1 OF 1 LYMPH NODE (0/1).  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   Given extensive LVSI adjuvant bracytherapy was recommended. Radiation treatment dates: February 19th, February 25, March 5, March 12, May 21, 2013  Site/dose: Proximal vagina 30 Gy in 5 fractions  Beams/energy: Patient was treated with intracavitary brachytherapy treatments alone using iridium 192 as the high-dose-rate source. The patient was treated with a 3 cm diameter cylinder. Prescription was to the mucosal surface. Treatment length was 4 cm  Pap 12/27/2013 LSIL +hrHPV DNA Present today for colposcopy  Social Hx:   History   Social History  . Marital Status: Divorced    Spouse Name: N/A    Number of Children: N/A   . Years of Education: N/A   Occupational History  . Not on file.   Social History Main Topics  . Smoking status: Current Every Day Smoker -- 0.50 packs/day    Types: Cigarettes  . Smokeless tobacco: Never Used  . Alcohol Use: No  . Drug Use: No  . Sexual Activity: Not Currently    Birth Control/ Protection: Surgical   Other Topics Concern  . Not on file   Social History Narrative  Stopped smoking 1 month ago.  Is on a nicotine patch.  Exercises daily and is on Weight Watchers.  Has lost 28 pounds.  Past Surgical Hx:  Past Surgical History  Procedure Laterality Date  . Cesarean section      X2  . Tubal ligation    . Dilitation & currettage/hystroscopy with hydrothermal ablation N/A 01/01/2013    Procedure: DILATATION & CURETTAGE/HYSTEROSCOPY WITH ATTEMPTED HYDROTHERMAL ABLATION: Change over to [place] @ 1505;  Surgeon: Osborne Oman, MD;  Location: [facility];  Service: Gynecology;  Laterality: N/A;  . Laparoscopic assisted vaginal hysterectomy N/A 01/22/2013    Procedure: LAPAROSCOPIC ASSISTED VAGINAL HYSTERECTOMY with Bilateral Fallopian Tubes and Ovaries;  Surgeon: Osborne Oman, MD;  Location: [facility];  Service: Gynecology;  Laterality: N/A;  . Robotic assisted total hysterectomy with bilateral salpingo oopherectomy Bilateral 03/31/2013    Procedure: ROBOTIC ASSISTED BILATERAL OOPHORECTOMY PELVIC AND POSSIBLE PARA-AORTIC LYMPH NODE DISSECTION;  Surgeon: Imagene Gurney A. Alycia Rossetti, MD;  Location: [facility];  Service: Gynecology;  Laterality: Bilateral;    Past Medical Hx:  Past Medical History  Diagnosis Date  . Anemia   . Menorrhagia   . Depression   . Anxiety   . Headache(784.0)     MIGRAINES  . Cancer     ENDOMETRIAL CANCER  . Complication of anesthesia     PT STATES SHE WOKE UP AFTER D&C [place].  STATES  NO PROBLEMS WAKING UP AFTER HER HYSTERECTOMY  . History of radiation therapy 04/23/13, 04/29/13, 05/07/13, 05/14/13, 05/21/13    30 Gy to  proximal vagina    Past Gynecological History: Gravida 2 per 2 menarche at age 87 with regular menses until 6 months ago. Reports oral contraceptive pill use for 14 years in the remote past Patient's last menstrual period was 11/03/2012. Denies h/o abnormal pap prior to hysterectomy.  Family Hx:  Family History  Problem Relation Age of Onset  . Diabetes Mother   . Hypertension Mother   . Cancer Neg Hx    ROS:  No cough no abdominal pain, no nausea or vomiting.  28 pound weight loss on Wellbutrin and weight watchers.  Neuropathic left leg pain managed with ibuprofen.  Otherwise ROS negative  Vitals:  Blood pressure 135/71, pulse 73, temperature 98.2 F (36.8 C), temperature source Oral, resp. rate 20, height 5' 6.5" (1.689 m), weight 243 lb 1.6 oz (110.269 kg), last menstrual period 11/03/2012. Wt Readings from Last 3 Encounters:  02/01/14 243 lb 1.6 oz (110.269 kg)  12/04/13 265 lb 4.8 oz (120.339 kg)  12/01/13 266 lb 6.4 oz (120.838 kg)  Body mass index is 38.65 kg/(m^2).  Physical Exam: WD in NAD  Chest:  CTA Cardiac:  RRR Psychiatry: Alert and oriented to person, place, and time  Abdomen : [facility] surgical  sites intact without evidence of hernia or masses.  LN:  No cervical supraclavicular or inguinal adenopathy Back:  No CVAT Pelvic:  Nl EGBUS no masses bleeding or discharge.  Vaginal colposcopy performed with application of 5% acetic acid.  No aceto white changes appreciated.  Red area at the right vaginal apex was biopsied. EXT:  No CCE Psychiatry:  Mildy pressured speech, very talkative, perseverates on topics.

## 2014-02-03 ENCOUNTER — Telehealth: Payer: Self-pay | Admitting: *Deleted

## 2014-02-03 ENCOUNTER — Telehealth: Payer: Self-pay

## 2014-02-03 NOTE — Telephone Encounter (Signed)
Per [name], NP patient called with results of vaginal biopsy. Spoke with pt and let her know it was negative. Pt very appreciative of call.

## 2014-02-03 NOTE — Telephone Encounter (Signed)
Sheryl Mathis had called earlier stating that wanted to stop by because had a doctor's appointment at [facility]. Sheryl Mathis voiced that her mother died at 13 from diabetes complications. Sheryl Mathis stated that she did not want that to happen to her. Per patient has not seen primary doctor yet but will be shortly. Discussed need to have A1C rechecked.Patient discussed how wants to live a healthy life. Discussed activity and to look into a scholarship from [facility].We discussed watching carbohydrates. Patient stated that was using higher fiber carbohydrates.  Patient received two more WeightWatchers passes. Patient stated that has talked to organization and hopefully will be able to join the first of  The year.Will call and follow up with patient.

## 2014-02-22 ENCOUNTER — Telehealth: Payer: Self-pay

## 2014-02-22 NOTE — Telephone Encounter (Signed)
Called to see if had been to all [employer]. Asked patient to call back.

## 2014-04-23 ENCOUNTER — Telehealth: Payer: Self-pay | Admitting: *Deleted

## 2014-04-23 NOTE — Telephone Encounter (Signed)
On 04-23-14 fax medical records to [facility], it was consult note, end of tx note, follow up note, sim & tx planning note.

## 2014-04-24 ENCOUNTER — Encounter (HOSPITAL_COMMUNITY): Payer: Self-pay

## 2014-04-24 ENCOUNTER — Encounter (HOSPITAL_COMMUNITY): Payer: Self-pay | Admitting: Emergency Medicine

## 2014-04-24 ENCOUNTER — Inpatient Hospital Stay (HOSPITAL_COMMUNITY)
Admission: AD | Admit: 2014-04-24 | Discharge: 2014-04-28 | DRG: 885 | Disposition: A | Discharge status: Home / Self Care | Payer: Federal, State, Local not specified - Other | Source: Intra-hospital | Attending: Psychiatry | Admitting: Psychiatry

## 2014-04-24 ENCOUNTER — Emergency Department (HOSPITAL_COMMUNITY)
Admission: EM | Admit: 2014-04-24 | Discharge: 2014-04-24 | Disposition: A | Discharge status: Home / Self Care | Payer: No Typology Code available for payment source | Attending: Emergency Medicine | Admitting: Emergency Medicine

## 2014-04-24 DIAGNOSIS — F410 Panic disorder [episodic paroxysmal anxiety] without agoraphobia: Secondary | ICD-10-CM | POA: Diagnosis present

## 2014-04-24 DIAGNOSIS — Z8249 Family history of ischemic heart disease and other diseases of the circulatory system: Secondary | ICD-10-CM

## 2014-04-24 DIAGNOSIS — Z923 Personal history of irradiation: Secondary | ICD-10-CM | POA: Insufficient documentation

## 2014-04-24 DIAGNOSIS — F329 Major depressive disorder, single episode, unspecified: Secondary | ICD-10-CM

## 2014-04-24 DIAGNOSIS — F332 Major depressive disorder, recurrent severe without psychotic features: Secondary | ICD-10-CM | POA: Diagnosis present

## 2014-04-24 DIAGNOSIS — Z79899 Other long term (current) drug therapy: Secondary | ICD-10-CM | POA: Insufficient documentation

## 2014-04-24 DIAGNOSIS — Z833 Family history of diabetes mellitus: Secondary | ICD-10-CM | POA: Diagnosis not present

## 2014-04-24 DIAGNOSIS — D649 Anemia, unspecified: Secondary | ICD-10-CM | POA: Insufficient documentation

## 2014-04-24 DIAGNOSIS — G4700 Insomnia, unspecified: Secondary | ICD-10-CM | POA: Diagnosis present

## 2014-04-24 DIAGNOSIS — Z3202 Encounter for pregnancy test, result negative: Secondary | ICD-10-CM | POA: Insufficient documentation

## 2014-04-24 DIAGNOSIS — Z8589 Personal history of malignant neoplasm of other organs and systems: Secondary | ICD-10-CM

## 2014-04-24 DIAGNOSIS — Z87891 Personal history of nicotine dependence: Secondary | ICD-10-CM | POA: Diagnosis not present

## 2014-04-24 DIAGNOSIS — G43909 Migraine, unspecified, not intractable, without status migrainosus: Secondary | ICD-10-CM | POA: Insufficient documentation

## 2014-04-24 DIAGNOSIS — F411 Generalized anxiety disorder: Secondary | ICD-10-CM | POA: Diagnosis present

## 2014-04-24 DIAGNOSIS — F32A Depression, unspecified: Secondary | ICD-10-CM

## 2014-04-24 DIAGNOSIS — R45850 Homicidal ideations: Secondary | ICD-10-CM

## 2014-04-24 DIAGNOSIS — Z8542 Personal history of malignant neoplasm of other parts of uterus: Secondary | ICD-10-CM | POA: Insufficient documentation

## 2014-04-24 LAB — CBC
HCT: 43.9 % (ref 36.0–46.0)
Hemoglobin: 14.8 g/dL (ref 12.0–15.0)
MCH: 29.2 pg (ref 26.0–34.0)
MCHC: 33.7 g/dL (ref 30.0–36.0)
MCV: 86.6 fL (ref 78.0–100.0)
PLATELETS: 375 10*3/uL (ref 150–400)
RBC: 5.07 MIL/uL (ref 3.87–5.11)
RDW: 12.8 % (ref 11.5–15.5)
WBC: 7.2 10*3/uL (ref 4.0–10.5)

## 2014-04-24 LAB — RAPID URINE DRUG SCREEN, HOSP PERFORMED
AMPHETAMINES: NOT DETECTED
Barbiturates: NOT DETECTED
Benzodiazepines: NOT DETECTED
Cocaine: NOT DETECTED
Opiates: NOT DETECTED
TETRAHYDROCANNABINOL: NOT DETECTED

## 2014-04-24 LAB — ETHANOL: Alcohol, Ethyl (B): <5 mg/dL (ref 0–9)

## 2014-04-24 LAB — COMPREHENSIVE METABOLIC PANEL
ALBUMIN: 4.4 g/dL (ref 3.5–5.2)
ALT: 27 U/L (ref 0–35)
ANION GAP: 9 (ref 5–15)
AST: 27 U/L (ref 0–37)
Alkaline Phosphatase: 89 U/L (ref 39–117)
BILIRUBIN TOTAL: 0.7 mg/dL (ref 0.3–1.2)
BUN: 17 mg/dL (ref 6–23)
CO2: 26 mmol/L (ref 19–32)
Calcium: 9.6 mg/dL (ref 8.4–10.5)
Chloride: 101 mmol/L (ref 96–112)
Creatinine, Ser: 0.44 mg/dL — ABNORMAL LOW (ref 0.50–1.10)
GFR calc Af Amer: >90 mL/min (ref >90)
Glucose, Bld: 93 mg/dL (ref 70–99)
Potassium: 4.1 mmol/L (ref 3.5–5.1)
Sodium: 136 mmol/L (ref 135–145)
Total Protein: 8.9 g/dL — ABNORMAL HIGH (ref 6.0–8.3)

## 2014-04-24 LAB — SALICYLATE LEVEL: Salicylate Lvl: <4 mg/dL (ref 2.8–20.0)

## 2014-04-24 LAB — PREGNANCY, URINE: PREG TEST UR: NEGATIVE

## 2014-04-24 LAB — ACETAMINOPHEN LEVEL: Acetaminophen (Tylenol), Serum: <10 ug/mL — ABNORMAL LOW (ref 10–30)

## 2014-04-24 MED ORDER — LORAZEPAM 1 MG PO TABS: 1.0000 mg | ORAL_TABLET | Freq: Three times a day (TID) | ORAL | Status: DC | PRN

## 2014-04-24 MED ORDER — HYDROXYZINE HCL 25 MG PO TABS
25.0000 mg | ORAL_TABLET | Freq: Once | ORAL | Status: AC
  Administered 2014-04-24: 25 mg via ORAL
  Filled 2014-04-24: qty 1

## 2014-04-24 MED ORDER — LORAZEPAM 1 MG PO TABS
1.0000 mg | ORAL_TABLET | Freq: Once | ORAL | Status: AC
  Administered 2014-04-24: 1 mg via ORAL
  Filled 2014-04-24: qty 1

## 2014-04-24 MED ORDER — ALUM & MAG HYDROXIDE-SIMETH 200-200-20 MG/5ML PO SUSP: 30.0000 mL | ORAL | Status: DC | PRN

## 2014-04-24 MED ORDER — ACETAMINOPHEN 325 MG PO TABS
650.0000 mg | ORAL_TABLET | Freq: Four times a day (QID) | ORAL | Status: DC | PRN
  Administered 2014-04-27 – 2014-04-28 (×3): 650 mg via ORAL
  Filled 2014-04-24 (×3): qty 2

## 2014-04-24 MED ORDER — MAGNESIUM HYDROXIDE 400 MG/5ML PO SUSP: 30.0000 mL | Freq: Every day | ORAL | Status: DC | PRN

## 2014-04-24 MED ORDER — ONDANSETRON HCL 4 MG PO TABS: 4.0000 mg | ORAL_TABLET | Freq: Three times a day (TID) | ORAL | Status: DC | PRN

## 2014-04-24 MED ORDER — IBUPROFEN 200 MG PO TABS: 600.0000 mg | ORAL_TABLET | Freq: Three times a day (TID) | ORAL | Status: DC | PRN

## 2014-04-24 MED ORDER — HYDROXYZINE HCL 50 MG PO TABS
50.0000 mg | ORAL_TABLET | Freq: Three times a day (TID) | ORAL | Status: DC | PRN
  Administered 2014-04-25 – 2014-04-28 (×8): 50 mg via ORAL
  Filled 2014-04-24: qty 1
  Filled 2014-04-24: qty 20
  Filled 2014-04-24 (×7): qty 1

## 2014-04-24 MED ORDER — ZOLPIDEM TARTRATE 5 MG PO TABS: 5.0000 mg | ORAL_TABLET | Freq: Every evening | ORAL | Status: DC | PRN

## 2014-04-24 NOTE — ED Notes (Addendum)
Pt from home c/o anxiety and a panic attack 1 hour PTA. Pt has hx of panic attacks. Pt sts that she took rx for the same last year but doesn't have rx any longer. Pt sts that she takes Wellbutrin for depression but her dose was recently cut in half and her anxiety and panic attacks have increased since. Pt adds that she has itching that she takes Benadryl for when it flares. Pt has no other c/o and is A&O, in NAD

## 2014-04-24 NOTE — BH Assessment (Addendum)
Assessment Note  Sheryl Mathis is an 44 y.o. female who presents to the ED with a friend due to the patient reporting that she has had increasing depression and anxiety for the past 2 months. Patient reports that she was diagnosed with cancer in 2014 and has had treatment and surgeries that led to depression and anxiety. Patient reports that her PCP, Dr. Alroy Dust at [facility] in [facility] prescribed Wellbutrin, reduced it from 150mg  - 75 mg two months ago. Patient reports that she has "a couple of anxiety attacks per day." Patient reports that she does not feel like getting out of bed most days, she has not cleaned up in two weeks and she sleeps 12-14 hours per day. Patient reports that she does not have a good appetite during the day and then "eats sweets at night until I get sick" at night. Patient reports that she does take baths on days that she has to go to work, but not other than those days. Patient reports that's hehad an anxiety attack today and she felt "numb" and "couldn't breathe" and she was crying and "hyperventilating." Patient reports that she was sweating profusely and was unable to speak or catch her breath. Patient reports that she has not had an anxiety attack this bad previously. Patient reports that she called a friend, Selinda Eon, who transported her to the hospital. Patient reports that she called Dr. Alroy Dust about her symptoms and he stated that he cannot see her until her appointment in two weeks. Patient reports that Selinda Eon is a great support ad friend, but she does nothave any other friends. Patient reports that she lives with her father and can return there if necessary. Patient reports that she has never seen a therapist or a psychiatrist. Patient reports that she just "hates people and I hate myself." Patient denies drinking alcohol outside of occassions and using illicit drugs. She reports that she takes her Wellbutrin as prescribed with her last does being  this morning.  Patient denies SI and psychosis. Patient reports that she does not know what she will do to someone else when asked about homicidal ideations. Patient reports that she becomes "so angry" but experienced difficulty when trying to express what she would do to someone else.   Axis I: Depressive Disorder NOS, Generalized Anxiety Disorder and Homicidal Ideations Axis II: No diagnosis Axis III:  Past Medical History  Diagnosis Date  . Anemia   . Menorrhagia   . Depression   . Anxiety   . Headache(784.0)     MIGRAINES  . Cancer     ENDOMETRIAL CANCER  . Complication of anesthesia     PT STATES SHE WOKE UP AFTER D&C [place].  STATES NO PROBLEMS WAKING UP AFTER HER HYSTERECTOMY  . History of radiation therapy 04/23/13, 04/29/13, 05/07/13, 05/14/13, 05/21/13    30 Gy to proximal vagina   Axis IV: problems with primary support group Axis V: 51-60 moderate symptoms  Past Medical History:  Past Medical History  Diagnosis Date  . Anemia   . Menorrhagia   . Depression   . Anxiety   . Headache(784.0)     MIGRAINES  . Cancer     ENDOMETRIAL CANCER  . Complication of anesthesia     PT STATES SHE WOKE UP AFTER D&C [place].  STATES NO PROBLEMS WAKING UP AFTER HER HYSTERECTOMY  . History of radiation therapy 04/23/13, 04/29/13, 05/07/13, 05/14/13, 05/21/13  30 Gy to proximal vagina    Past Surgical History  Procedure Laterality Date  . Cesarean section      X2  . Tubal ligation    . Dilitation & currettage/hystroscopy with hydrothermal ablation N/A 01/01/2013    Procedure: DILATATION & CURETTAGE/HYSTEROSCOPY WITH ATTEMPTED HYDROTHERMAL ABLATION: Change over to [place] @ 1505;  Surgeon: Osborne Oman, MD;  Location: [facility];  Service: Gynecology;  Laterality: N/A;  . Laparoscopic assisted vaginal hysterectomy N/A 01/22/2013    Procedure: LAPAROSCOPIC ASSISTED VAGINAL HYSTERECTOMY with Bilateral Fallopian Tubes and  Ovaries;  Surgeon: Osborne Oman, MD;  Location: [facility];  Service: Gynecology;  Laterality: N/A;  . Robotic assisted total hysterectomy with bilateral salpingo oopherectomy Bilateral 03/31/2013    Procedure: ROBOTIC ASSISTED BILATERAL OOPHORECTOMY PELVIC AND POSSIBLE PARA-AORTIC LYMPH NODE DISSECTION;  Surgeon: Imagene Gurney A. Alycia Rossetti, MD;  Location: [facility];  Service: Gynecology;  Laterality: Bilateral;    Family History:  Family History  Problem Relation Age of Onset  . Diabetes Mother   . Hypertension Mother   . Cancer Neg Hx     Social History:  reports that she quit smoking about 3 months ago. Her smoking use included Cigarettes. She smoked 0.50 packs per day. She has never used smokeless tobacco. She reports that she does not drink alcohol or use illicit drugs.  Additional Social History:     CIWA: CIWA-Ar BP: 155/84 mmHg Pulse Rate: 67 COWS:    Allergies: No Known Allergies  Home Medications:  (Not in a hospital admission)  OB/GYN Status:  Patient's last menstrual period was 11/03/2012.  General Assessment Data Location of Assessment: [facility] ACT Assessment: Yes Is this a Tele or Face-to-Face Assessment?: Face-to-Face Is this an Initial Assessment or a Re-assessment for this encounter?: Initial Assessment Living Arrangements: Parent Can pt return to current living arrangement?: Yes Admission Status: Voluntary Is patient capable of signing voluntary admission?: Yes Transfer from: Home Referral Source: Self/Family/Friend     [place] Living Arrangements: Parent Name of Psychiatrist: None Name of Therapist: None  Education Status Is patient currently in school?: No Current Grade: N/A Highest grade of school patient has completed: GED  Risk to self with the past 6 months Suicidal Ideation: No Suicidal Intent: No Is patient at risk for suicide?: No Suicidal Plan?: No Access to Means: No What has been your use of drugs/alcohol within the last 12 months?:  None reported Previous Attempts/Gestures: No How many times?: 0 Other Self Harm Risks: None reported Triggers for Past Attempts: None known Intentional Self Injurious Behavior: None Family Suicide History: No Recent stressful life event(s): Other (Comment) (Patient reports that work is stressful) Persecutory voices/beliefs?: No Depression: Yes Depression Symptoms: Tearfulness, Isolating, Fatigue, Guilt, Loss of interest in usual pleasures, Feeling worthless/self pity, Feeling angry/irritable Substance abuse history and/or treatment for substance abuse?: No Suicide prevention information given to non-admitted patients: Not applicable  Risk to Others within the past 6 months Homicidal Ideation: No Thoughts of Harm to Others: Yes-Currently Present Comment - Thoughts of Harm to Others: Passive thoughts with no intent or plan Current Homicidal Intent: No Current Homicidal Plan: No Access to Homicidal Means: No Identified Victim: None History of harm to others?: No Assessment of Violence: None Noted Violent Behavior Description: None Does patient have access to weapons?: No Criminal Charges Pending?: No Does patient have a court date: No  Psychosis Hallucinations: None noted Delusions: None noted  Mental Status Report Appear/Hygiene: Unremarkable Eye Contact: Fair Motor Activity: Freedom  of movement, Restlessness, Other (Comment) (Patient was scratching her arms &adjusting her hat frequentl) Speech: Logical/coherent Level of Consciousness: Alert Mood: Depressed, Anxious Affect: Anxious, Depressed Anxiety Level: Panic Attacks Panic attack frequency: "a couple a day" Most recent panic attack: Today Thought Processes: Coherent, Relevant Judgement: Unimpaired Orientation: Person, Place, Time, Situation Obsessive Compulsive Thoughts/Behaviors: None  Cognitive Functioning Concentration: Normal Memory: Recent Intact, Remote Intact IQ: Average Insight: Good Impulse Control:  Good Appetite: Fair Weight Gain: 10 Sleep: Increased Total Hours of Sleep: 14 Vegetative Symptoms: Staying in bed, Decreased grooming  ADLScreening [facility] Assessment Services) Patient's cognitive ability adequate to safely complete daily activities?: Yes Patient able to express need for assistance with ADLs?: Yes Independently performs ADLs?: Yes (appropriate for developmental age)  Prior Inpatient Therapy Prior Inpatient Therapy: No Prior Therapy Dates: N/A Prior Therapy Facilty/Provider(s): N/A Reason for Treatment: N/A  Prior Outpatient Therapy Prior Outpatient Therapy: No  ADL Screening (condition at time of admission) Patient's cognitive ability adequate to safely complete daily activities?: Yes Patient able to express need for assistance with ADLs?: Yes Independently performs ADLs?: Yes (appropriate for developmental age)             Advance Directives (For Healthcare) Does patient have an advance directive?: No Would patient like information on creating an advanced directive?: No - patient declined information    Additional Information 1:1 In Past 12 Months?: No CIRT Risk: No Elopement Risk: No     Disposition:  Disposition Initial Assessment Completed for this Encounter: Yes Disposition of Patient: Other dispositions Other disposition(s): Other (Comment) (waiting for provider)  On Site Evaluation by:   Reviewed with Physician:    Irean Hong 04/24/2014 4:49 PM

## 2014-04-24 NOTE — ED Notes (Signed)
Pt has been accepted to [facility] and can transfer after 1900

## 2014-04-24 NOTE — ED Notes (Signed)
MD Jacubowitz at bedside.

## 2014-04-24 NOTE — ED Notes (Signed)
Report received from [facility] Pt. Alert and oriented in no distress denies SI, AVH and pain.  Pt. States she has some HI without a specific person in mind. Pt. Instructed to come to me with problems or concerns.Will continue to monitor for safety via security cameras and Q 15 minute checks.

## 2014-04-24 NOTE — ED Notes (Signed)
Report given to [facility] in [place]

## 2014-04-24 NOTE — ED Provider Notes (Signed)
Patient complains of depression for the past 3 months and vague homicidal ideation against no specific person. She felt as if she can't cope sleeps 12 hours per day. Patient is presently alert cooperative Glasgow Coma Score 15 mildly tearful  Orlie Dakin, MD 04/24/14 [phone]

## 2014-04-24 NOTE — ED Provider Notes (Signed)
CSN: [id number]     Arrival date & time 04/24/14  1440 History   First MD Initiated Contact with Patient 04/24/14 1457     Chief Complaint  Patient presents with  . Anxiety     (Consider location/radiation/quality/duration/timing/severity/associated sxs/prior Treatment) HPI Comments: 44 year old female with past medical history depression, anxiety, endometrial carcinoma in remission and anemia presenting with a friend after having a panic attack about one hour prior to arrival. Patient reports over the past few weeks her depression has worsened, Wellbutrin dose was cut in half,, and since then her panic attacks have increased. States the depression is interfering with her daily life, reports she is sleeping throughout most of the day, initially had no appetite, now is overeating, constant itching, crying, and unable to get through the workday. Takes benadryl for the itching. Denies suicidal ideations. States "I feel like I want to kill everyone around me" but would never act on it. Today, she went to drive to a restaurant, when she was in the parking lot, states her entire body went numb, became very anxious and could not get out of the car. Called her friend who came to get her to bring her to the emergency department.  Patient is a 44 y.o. female presenting with anxiety. The history is provided by the patient and a friend.  Anxiety    Past Medical History  Diagnosis Date  . Anemia   . Menorrhagia   . Depression   . Anxiety   . Headache(784.0)     MIGRAINES  . Cancer     ENDOMETRIAL CANCER  . Complication of anesthesia     PT STATES SHE WOKE UP AFTER D&C [place].  STATES NO PROBLEMS WAKING UP AFTER HER HYSTERECTOMY  . History of radiation therapy 04/23/13, 04/29/13, 05/07/13, 05/14/13, 05/21/13    30 Gy to proximal vagina   Past Surgical History  Procedure Laterality Date  . Cesarean section      X2  . Tubal ligation    . Dilitation & currettage/hystroscopy  with hydrothermal ablation N/A 01/01/2013    Procedure: DILATATION & CURETTAGE/HYSTEROSCOPY WITH ATTEMPTED HYDROTHERMAL ABLATION: Change over to [place] @ 1505;  Surgeon: Osborne Oman, MD;  Location: [facility];  Service: Gynecology;  Laterality: N/A;  . Laparoscopic assisted vaginal hysterectomy N/A 01/22/2013    Procedure: LAPAROSCOPIC ASSISTED VAGINAL HYSTERECTOMY with Bilateral Fallopian Tubes and Ovaries;  Surgeon: Osborne Oman, MD;  Location: [facility];  Service: Gynecology;  Laterality: N/A;  . Robotic assisted total hysterectomy with bilateral salpingo oopherectomy Bilateral 03/31/2013    Procedure: ROBOTIC ASSISTED BILATERAL OOPHORECTOMY PELVIC AND POSSIBLE PARA-AORTIC LYMPH NODE DISSECTION;  Surgeon: Imagene Gurney A. Alycia Rossetti, MD;  Location: [facility];  Service: Gynecology;  Laterality: Bilateral;   Family History  Problem Relation Age of Onset  . Diabetes Mother   . Hypertension Mother   . Cancer Neg Hx    History  Substance Use Topics  . Smoking status: Former Smoker -- 0.50 packs/day for 26 years    Types: Cigarettes    Quit date: 01/01/2014  . Smokeless tobacco: Never Used  . Alcohol Use: 0.0 oz/week    0 Standard drinks or equivalent per week   OB History    Gravida Para Term Preterm AB TAB SAB Ectopic Multiple Living   2 2 2       2      Review of Systems  Skin:       + pruritis  Psychiatric/Behavioral: Positive  for dysphoric mood. The patient is nervous/anxious.   All other systems reviewed and are negative.     Allergies  Review of patient's allergies indicates no known allergies.  Home Medications   Prior to Admission medications   Medication Sig Start Date End Date Taking? Authorizing Provider  amitriptyline (ELAVIL) 25 MG tablet Take 1 tablet (25 mg total) by mouth at bedtime. 02/01/14  Yes Melissa D Cross, NP  buPROPion (WELLBUTRIN XL) 150 MG 24 hr tablet Take 150 mg by mouth daily.   Yes Historical Provider, MD  ibuprofen (ADVIL,MOTRIN) 800 MG  tablet Take 1 tablet (800 mg total) by mouth every 8 (eight) hours as needed for mild pain or moderate pain (left thigh nerve related pain). 02/01/14  Yes Melissa D Cross, NP  Multiple Vitamins-Minerals (MULTIVITAL PO) Take 1 tablet by mouth daily.   Yes Historical Provider, MD  nicotine (NICODERM CQ - DOSED IN MG/24 HOURS) 21 mg/24hr patch Place 21 mg onto the skin daily.   Yes Historical Provider, MD  Probiotic Product (PROBIOTIC DAILY PO) Take 1 tablet by mouth daily.   Yes Historical Provider, MD   BP 131/85 mmHg  Pulse 99  Temp(Src) 98 F (36.7 C) (Oral)  Resp 16  SpO2 100%  LMP 11/03/2012 Physical Exam  Constitutional: She is oriented to person, place, and time. She appears well-developed and well-nourished. No distress.  HENT:  Head: Normocephalic and atraumatic.  Mouth/Throat: Oropharynx is clear and moist.  Eyes: Conjunctivae and EOM are normal.  Neck: Normal range of motion. Neck supple.  Cardiovascular: Normal rate, regular rhythm and normal heart sounds.   Pulmonary/Chest: Effort normal and breath sounds normal. No respiratory distress.  Musculoskeletal: Normal range of motion. She exhibits no edema.  Neurological: She is alert and oriented to person, place, and time. No sensory deficit.  Skin: Skin is warm and dry.  Psychiatric: Her behavior is normal. Her mood appears anxious. She exhibits a depressed mood. She expresses homicidal ideation. She expresses no suicidal ideation.  Tearful.  Nursing note and vitals reviewed.   ED Course  Procedures (including critical care time) Labs Review Labs Reviewed  COMPREHENSIVE METABOLIC PANEL - Abnormal; Notable for the following:    Creatinine, Ser 0.44 (*)    Total Protein 8.9 (*)    All other components within normal limits  ACETAMINOPHEN LEVEL - Abnormal; Notable for the following:    Acetaminophen (Tylenol), Serum <10.0 (*)    All other components within normal limits  CBC  URINE RAPID DRUG SCREEN (HOSP PERFORMED)   ETHANOL  SALICYLATE LEVEL  PREGNANCY, URINE    Imaging Review No results found.   EKG Interpretation None      MDM   Final diagnoses:  Depression  Panic attack  Homicidal ideations  Generalized anxiety disorder   NAD. AFVSS. Medically cleared. Assessed by TTS, inpatient treatment recommended. Pt here voluntarily. Awaiting placement.  Pt accepted to [facility] and transferred.  Discussed with attending Dr. Winfred Leeds who agrees with plan of care.   Carman Ching, PA-C 04/24/14 [place], MD 04/25/14 [phone]

## 2014-04-24 NOTE — Progress Notes (Signed)
Psychoeducational Group Note  Date:  04/24/2014 Time:  2248  Group Topic/Focus:  Wrap-Up Group:   The focus of this group is to help patients review their daily goal of treatment and discuss progress on daily workbooks.  Participation Level: Did Not Attend  Participation Quality:  Not Applicable  Affect:  Not Applicable  Cognitive:  Not Applicable  Insight:  Not Applicable  Engagement in Group: Not Applicable  Additional Comments:  The patient did not attend group this evening since she had yet to be admitted to the hallway.   Archie Balboa S 04/24/2014, 10:48 PM

## 2014-04-24 NOTE — ED Notes (Addendum)
(  [phone] Sharyn Lull patients friend  Deveron Furlong [phone]  Dad [phone]

## 2014-04-24 NOTE — ED Notes (Signed)
Pt sts that she is not suicidal, but sts "I feel like I want to kill everyone else." When asked, pt sts that she wouldn't hurt anyone, but thinks about it.

## 2014-04-24 NOTE — Consult Note (Signed)
[facility] Face-to-Face Psychiatry Consult   Reason for Consult:  Depression, Anxiety Referring Physician:  EDP Patient Identification: Sheryl Mathis MRN:  [id number] Principal Diagnosis: Major depressive disorder, recurrent severe without psychotic features Diagnosis:   Patient Active Problem List   Diagnosis Date Noted  . Major depressive disorder, recurrent severe without psychotic features [F33.2] 04/24/2014    Priority: High  . Obesity (BMI 30-39.9) [E66.9] 02/01/2014  . Uterine cancer [C55] 03/31/2013  . Endometrial carcinoma [C54.1] 02/02/2013  . S/P laparoscopic assisted vaginal hysterectomy (LAVH) and bilateral salpingectomy (BS) on 01/22/13 [Z90.710] 01/22/2013    Total Time spent with patient: 45 minutes  Subjective:   Sheryl Mathis is a 44 y.o. female patient admitted with Depression and Anxiety.  HPI:  Female, 44 years old was evaluated for anxiety and depression.  Patient rated her anxiety 10/10 and Depression 10 /10 with 10 being severe depression or anxiety.  Patient came in after severe anxiety with panic attacks today.   She reported that she walked out of her job twice last week due to anxiety.  Patient reported frequent crying spells, hypersomnia  and poor appetite.   She reports lack of motivation to engage in any activity, she reports feelings of hopelessness and helplessness.  Patient stated that she has been taking Wellbutrin prescribed by her PMD without relief.  Her Wellbutrin was reduced recently from 150 mg to 75 mg daily due to increased anxiety and Panic attacks.  Patient reports when her anxiety state gets high her entire body itches and breaks out in hives. Patient was scratching her arms as we were talking. Patient denies SI/AVH but stated that she feels like hurting people around her.   She repeatedly stated that she is not sure of what she will do to people around her.  She denies Alcohol and drug use.   Patient have been accepted for safety and stabilization and she  has been assigned a bed at [facility].  She will be moved to [facility] as soon as transportation is here.  HPI Elements:   Location:  Major depressive disorder, recurrent, anxiety disorder, . Quality:  severe, hypersomnia, poor appetite, anxiety, irritability, homicidal thoughts. Severity:  severe. Timing:  Acute. Duration:  More than 8 months. Context:  Seeking treatment for depression and anxiety.  Past Medical History:  Past Medical History  Diagnosis Date  . Anemia   . Menorrhagia   . Depression   . Anxiety   . Headache(784.0)     MIGRAINES  . Cancer     ENDOMETRIAL CANCER  . Complication of anesthesia     PT STATES SHE WOKE UP AFTER D&C [place].  STATES NO PROBLEMS WAKING UP AFTER HER HYSTERECTOMY  . History of radiation therapy 04/23/13, 04/29/13, 05/07/13, 05/14/13, 05/21/13    30 Gy to proximal vagina    Past Surgical History  Procedure Laterality Date  . Cesarean section      X2  . Tubal ligation    . Dilitation & currettage/hystroscopy with hydrothermal ablation N/A 01/01/2013    Procedure: DILATATION & CURETTAGE/HYSTEROSCOPY WITH ATTEMPTED HYDROTHERMAL ABLATION: Change over to [place] @ 1505;  Surgeon: Osborne Oman, MD;  Location: [facility];  Service: Gynecology;  Laterality: N/A;  . Laparoscopic assisted vaginal hysterectomy N/A 01/22/2013    Procedure: LAPAROSCOPIC ASSISTED VAGINAL HYSTERECTOMY with Bilateral Fallopian Tubes and Ovaries;  Surgeon: Osborne Oman, MD;  Location: [facility];  Service: Gynecology;  Laterality: N/A;  . Robotic assisted total hysterectomy  with bilateral salpingo oopherectomy Bilateral 03/31/2013    Procedure: ROBOTIC ASSISTED BILATERAL OOPHORECTOMY PELVIC AND POSSIBLE PARA-AORTIC LYMPH NODE DISSECTION;  Surgeon: Imagene Gurney A. Alycia Rossetti, MD;  Location: [facility];  Service: Gynecology;  Laterality: Bilateral;   Family History:  Family History  Problem Relation Age of Onset  . Diabetes Mother   . Hypertension Mother   . Cancer  Neg Hx    Social History:  History  Alcohol Use No     History  Drug Use No    History   Social History  . Marital Status: Divorced    Spouse Name: N/A  . Number of Children: N/A  . Years of Education: N/A   Social History Main Topics  . Smoking status: Former Smoker -- 0.50 packs/day    Types: Cigarettes    Quit date: 01/01/2014  . Smokeless tobacco: Never Used  . Alcohol Use: No  . Drug Use: No  . Sexual Activity: Not Currently    Birth Control/ Protection: Surgical   Other Topics Concern  . None   Social History Narrative   Additional Social History:                          Allergies:  No Known Allergies  Vitals: Blood pressure 138/80, pulse 71, temperature 97.8 F (36.6 C), temperature source Oral, resp. rate 18, last menstrual period 11/03/2012, SpO2 100 %.  Risk to Self: Suicidal Ideation: No Suicidal Intent: No Is patient at risk for suicide?: No Suicidal Plan?: No Access to Means: No What has been your use of drugs/alcohol within the last 12 months?: None reported How many times?: 0 Other Self Harm Risks: None reported Triggers for Past Attempts: None known Intentional Self Injurious Behavior: None Risk to Others: Homicidal Ideation: No Thoughts of Harm to Others: Yes-Currently Present Comment - Thoughts of Harm to Others: Passive thoughts with no intent or plan Current Homicidal Intent: No Current Homicidal Plan: No Access to Homicidal Means: No Identified Victim: None History of harm to others?: No Assessment of Violence: None Noted Violent Behavior Description: None Does patient have access to weapons?: No Criminal Charges Pending?: No Does patient have a court date: No Prior Inpatient Therapy: Prior Inpatient Therapy: No Prior Therapy Dates: N/A Prior Therapy Facilty/Provider(s): N/A Reason for Treatment: N/A Prior Outpatient Therapy: Prior Outpatient Therapy: No  Current Facility-Administered Medications  Medication Dose  Route Frequency Provider Last Rate Last Dose  . alum & mag hydroxide-simeth (MAALOX/MYLANTA) 200-200-20 MG/5ML suspension 30 mL  30 mL Oral PRN Robyn M Hess, PA-C      . ibuprofen (ADVIL,MOTRIN) tablet 600 mg  600 mg Oral Q8H PRN Carman Ching, PA-C      . LORazepam (ATIVAN) tablet 1 mg  1 mg Oral Q8H PRN Robyn M Hess, PA-C      . ondansetron [place]) tablet 4 mg  4 mg Oral Q8H PRN Robyn M Hess, PA-C      . zolpidem (AMBIEN) tablet 5 mg  5 mg Oral QHS PRN Carman Ching, PA-C       Current Outpatient Prescriptions  Medication Sig Dispense Refill  . amitriptyline (ELAVIL) 25 MG tablet Take 1 tablet (25 mg total) by mouth at bedtime. 30 tablet 0  . buPROPion (WELLBUTRIN XL) 150 MG 24 hr tablet Take 150 mg by mouth daily.    Marland Kitchen ibuprofen (ADVIL,MOTRIN) 800 MG tablet Take 1 tablet (800 mg total) by mouth every 8 (eight) hours as needed for  mild pain or moderate pain (left thigh nerve related pain). 30 tablet 1  . Multiple Vitamins-Minerals (MULTIVITAL PO) Take 1 tablet by mouth daily.    . nicotine (NICODERM CQ - DOSED IN MG/24 HOURS) 21 mg/24hr patch Place 21 mg onto the skin daily.    . Probiotic Product (PROBIOTIC DAILY PO) Take 1 tablet by mouth daily.      Musculoskeletal: Strength & Muscle Tone: within normal limits Gait & Station: normal Patient leans: N/A  Psychiatric Specialty Exam:     Blood pressure 138/80, pulse 71, temperature 97.8 F (36.6 C), temperature source Oral, resp. rate 18, last menstrual period 11/03/2012, SpO2 100 %.There is no weight on file to calculate BMI.  General Appearance: Casual  Eye Contact::  Good  Speech:  Clear and Coherent and Pressured  Volume:  Normal  Mood:  Angry, Depressed, Hopeless and Irritable  Affect:  Congruent, Depressed, Flat and Tearful  Thought Process:  Coherent, Goal Directed and Intact  Orientation:  Full (Time, Place, and Person)  Thought Content:  WDL  Suicidal Thoughts:  No  Homicidal Thoughts:  Yes.  without intent/plan   Memory:  Immediate;   Good Recent;   Good Remote;   Good  Judgement:  Good  Insight:  Good  Psychomotor Activity:  Psychomotor Retardation  Concentration:  Good  Recall:  NA  Fund of Knowledge:Good  Language: Good  Akathisia:  NA  Handed:  Right  AIMS (if indicated):     Assets:  Desire for Improvement  ADL's:  Intact  Cognition: WNL  Sleep:      Medical Decision Making: Established Problem, Worsening (2)  Treatment Plan Summary: Daily contact with patient to assess and evaluate symptoms and progress in treatment, Medication management and Plan Accepted at [facility] and will be transferred asa soon as transportation is here  Plan:  Recommend psychiatric Inpatient admission when medically cleared. Disposition: Admitted.  Delfin Gant   PMHNP-BC 04/24/2014 5:37 PM

## 2014-04-25 ENCOUNTER — Encounter (HOSPITAL_COMMUNITY): Payer: Self-pay | Admitting: Psychiatry

## 2014-04-25 DIAGNOSIS — F410 Panic disorder [episodic paroxysmal anxiety] without agoraphobia: Secondary | ICD-10-CM | POA: Diagnosis present

## 2014-04-25 DIAGNOSIS — F332 Major depressive disorder, recurrent severe without psychotic features: Principal | ICD-10-CM

## 2014-04-25 MED ORDER — DULOXETINE HCL 30 MG PO CPEP
30.0000 mg | ORAL_CAPSULE | Freq: Every day | ORAL | Status: DC
  Administered 2014-04-25 – 2014-04-28 (×4): 30 mg via ORAL
  Filled 2014-04-25 (×8): qty 1
  Filled 2014-04-25: qty 14

## 2014-04-25 NOTE — Progress Notes (Signed)
Admission Note:  D:43 yr female who presents [facility] in no acute distress for the treatment of SI, Depression, and anxiety. Pt appears flat and depressed. Pt was calm and cooperative with admission process. Pt denies  SI/AVH at this time . Pt endorses HI towards no specific person.  Pt stated she has been feeling increasingly anxious , very depressed x2 weeks when Dr. Reduced her Wellbutrin from 150mg  to 75mg .    A:Skin was assessed and found to be clear of any abnormal marks apart from tattoo back, L-arm, R-leg. POC and unit policies explained and understanding verbalized. Consents obtained.  R: Food and fluids offered, and fluids accepted. Pt had no additional questions or concerns.

## 2014-04-25 NOTE — Tx Team (Signed)
Initial Interdisciplinary Treatment Plan   PATIENT STRESSORS: Health problems Medication change or noncompliance   PATIENT STRENGTHS: Ability for insight Average or above average intelligence Capable of independent living General fund of knowledge Motivation for treatment/growth   PROBLEM LIST: Problem List/Patient Goals Date to be addressed Date deferred Reason deferred Estimated date of resolution  Risk for suicide      depression      anxiety      "want to get better and work on coping skills"                                     DISCHARGE CRITERIA:  Improved stabilization in mood, thinking, and/or behavior Verbal commitment to aftercare and medication compliance  PRELIMINARY DISCHARGE PLAN: Attend aftercare/continuing care group Outpatient therapy  PATIENT/FAMIILY INVOLVEMENT: This treatment plan has been presented to and reviewed with the patient, Sheryl Mathis.  The patient and family have been given the opportunity to ask questions and make suggestions.  [name] 04/25/2014, 2:23 AM

## 2014-04-25 NOTE — Progress Notes (Signed)
Discharge Planning  Group Note  Date:  04/25/2014 Time:  Group Topic/Focus:  Discharge Planning Group: The focus of the group is to help patients identify what their discharge looks like and also what parts of their plan need to be completed, in order for their discahrge to take place.  Patient did not attend.      Lauralyn Primes 10:39 AM. 04/25/2014

## 2014-04-25 NOTE — BHH Group Notes (Signed)
[facility] Group Notes:  (Clinical Social Work)  04/25/2014  10:00-11:00AM  Summary of Progress/Problems:   The main focus of today's process group was to   1)  discuss the importance of adding supports  2)  define health supports versus unhealthy supports  3)  identify the patient's current unhealthy supports and plan how to handle them  4)  Identify the patient's current healthy supports and plan what to add.  An emphasis was placed on using counselor, doctor, therapy groups, 12-step groups, and problem-specific support groups to expand supports.    The patient expressed full comprehension of the concepts presented, and agreed that there is a need to add more supports.  The patient stated her father and workplace are healthy supports, although there are things happening at the workplace that make in unhealthy at times now.    Type of Therapy:  Process Group with Motivational Interviewing  Participation Level:  Active  Participation Quality:  Attentive  Affect:  Depressed and Flat  Cognitive:  Appropriate  Insight:  Developing/Improving  Engagement in Therapy:  Engaged  Modes of Intervention:   Education, Support and Processing, Activity  [employer], LCSW 04/25/2014, 12:15pm

## 2014-04-25 NOTE — BHH Suicide Risk Assessment (Signed)
[facility] Admission Suicide Risk Assessment   Nursing information obtained from:    Demographic factors:    Current Mental Status:    Loss Factors:    Historical Factors:    Risk Reduction Factors:    Total Time spent with patient: 30 minutes Principal Problem: MDD (major depressive disorder), recurrent severe, without psychosis Diagnosis:   Patient Active Problem List   Diagnosis Date Noted  . Panic disorder [F41.0] 04/25/2014  . MDD (major depressive disorder), recurrent severe, without psychosis [F33.2] 04/24/2014  . Obesity (BMI 30-39.9) [E66.9] 02/01/2014  . Uterine cancer [C55] 03/31/2013  . Endometrial carcinoma [C54.1] 02/02/2013  . S/P laparoscopic assisted vaginal hysterectomy (LAVH) and bilateral salpingectomy (BS) on 01/22/13 [Z90.710] 01/22/2013     Continued Clinical Symptoms:    The "Alcohol Use Disorders Identification Test", Guidelines for Use in Primary Care, Second Edition.  World Pharmacologist [facility]). Score between 0-7:  no or low risk or alcohol related problems. Score between 8-15:  moderate risk of alcohol related problems. Score between 16-19:  high risk of alcohol related problems. Score 20 or above:  warrants further diagnostic evaluation for alcohol dependence and treatment.   CLINICAL FACTORS:   Depression:   Hopelessness Impulsivity Previous Psychiatric Diagnoses and Treatments   Musculoskeletal: Strength & Muscle Tone: within normal limits Gait & Station: normal Patient leans: N/A  Psychiatric Specialty Exam: Physical Exam  Review of Systems  Psychiatric/Behavioral: Positive for depression. The patient is nervous/anxious.     Blood pressure 112/63, pulse 71, temperature 98.1 F (36.7 C), temperature source Oral, resp. rate 16, height 5\' 6"  (1.676 m), weight 112.492 kg (248 lb), last menstrual period 11/03/2012.Body mass index is 40.05 kg/(m^2).  General Appearance: Fairly Groomed  Engineer, water::  Fair  Speech:  Normal Rate  Volume:   Normal  Mood:  Anxious and Depressed  Affect:  Tearful  Thought Process:  Coherent  Orientation:  Full (Time, Place, and Person)  Thought Content:  Rumination  Suicidal Thoughts:  No  Homicidal Thoughts:  No  Memory:  Immediate;   Fair Recent;   Fair Remote;   Fair  Judgement:  Impaired  Insight:  Fair  Psychomotor Activity:  Normal  Concentration:  Fair  Recall:  [employer] of Knowledge:Fair  Language: Fair  Akathisia:  No  Handed:  Right  AIMS (if indicated):     Assets:  Communication Skills Desire for Improvement  Sleep:  Number of Hours: 6.75  Cognition: WNL  ADL's:  Intact     COGNITIVE FEATURES THAT CONTRIBUTE TO RISK:  None    SUICIDE RISK:   Mild:  Suicidal ideation of limited frequency, intensity, duration, and specificity.  There are no identifiable plans, no associated intent, mild dysphoria and related symptoms, good self-control (both objective and subjective assessment), few other risk factors, and identifiable protective factors, including available and accessible social support.  PLAN OF CARE: Patient will benefit from inpatient treatment and stabilization.  Estimated length of stay is 5-7 days.  Reviewed past medical records,treatment plan.  Will start Cymbalta 30 mg po daily. DC Wellbutrin for SE. Continue Vistaril for itching as well as anxiety symptoms. Will continue to monitor vitals ,medication compliance and treatment side effects while patient is here.  Will monitor for medical issues as well as call consult as needed.  Reviewed labs ,will order as needed.  CSW will start working on disposition.  Patient to participate in therapeutic milieu .       Medical Decision Making:  Review  of Psycho-Social Stressors (1), Review or order clinical lab tests (1), Review of Medication Regimen & Side Effects (2) and Review of New Medication or Change in Dosage (2)  I certify that inpatient services furnished can reasonably be expected to improve the  patient's condition.   Gustavia Carie MD 04/25/2014, 3:56 PM
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
## 2014-04-25 NOTE — Progress Notes (Signed)
D This nurse is summoned to patient's room around 0800 this morning, when patient summoned nurse to her room, saying " I need help.Marland KitchenMarland KitchenI can't breathe... I 'm having a panic attack and I don't know what to do... Will   you help me???"   A Pt is observed sitting at the head of  Her bed with her legs propped up and her arms on her knees. She is sweaty, fanning a brochure, she says " I'm so hot.Marland KitchenMarland KitchenI  can't stand it".Marland KitchenMarland KitchenShe is given 25 mg po vistaril and educated how to breathe..in through her nose and out through pursed lips, she is able to  slow her respiratory rate down and after 2-2 1/2 minutes she says " I think I'm going to be ok..." She reports this ( panic ) has been " out of control" ever since her doctor halved her wellbutrin dose and says " I can't go on like this..." . Discussed with patient how she can talk to the physician when she meets them and together she and the doctor can decide what type of changes might help her.   R Safety is in place and poc cont.

## 2014-04-25 NOTE — Progress Notes (Signed)
Psychoeducational Group Note  Date:  04/25/2014 Time:  2244  Group Topic/Focus:  Wrap-Up Group:   The focus of this group is to help patients review their daily goal of treatment and discuss progress on daily workbooks.  Participation Level: Did Not Attend  Participation Quality:  Not Applicable  Affect:  Not Applicable  Cognitive:  Not Applicable  Insight:  Not Applicable  Engagement in Group: Not Applicable  Additional Comments:  The patient did not attend this evening's group and was in the hallway speaking with her nurse before going back to her room.   Archie Balboa S 04/25/2014, 11:44 PM
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
## 2014-04-26 LAB — TSH: TSH: 1.309 u[IU]/mL (ref 0.350–4.500)

## 2014-04-26 NOTE — Progress Notes (Signed)
[facility] MD Progress Note  04/26/2014 6:27 PM LASONJA LAKINS  MRN:  [id number] Subjective:  States she was being prescribed Effexor 37. 5 mg and it was D/C and she was placed on Wellbutrin. States she had and adverse reaction to Wellbutrin that increased her anxiety, scratching deep in her skin, feeling overwhelmed with uncontrollable panic attacks. She is a survivor of endometrial cancer. After the surgery she states she started having panic attacks. She also endorses chronic pain in the area the nodules were removed.  Principal Problem: MDD (major depressive disorder), recurrent severe, without psychosis Diagnosis:   Patient Active Problem List   Diagnosis Date Noted  . Panic disorder [F41.0] 04/25/2014  . MDD (major depressive disorder), recurrent severe, without psychosis [F33.2] 04/24/2014  . Obesity (BMI 30-39.9) [E66.9] 02/01/2014  . Uterine cancer [C55] 03/31/2013  . Endometrial carcinoma [C54.1] 02/02/2013  . S/P laparoscopic assisted vaginal hysterectomy (LAVH) and bilateral salpingectomy (BS) on 01/22/13 [Z90.710] 01/22/2013   Total Time spent with patient: 30 minutes   Past Medical History:  Past Medical History  Diagnosis Date  . Anemia   . Menorrhagia   . Depression   . Anxiety   . Headache(784.0)     MIGRAINES  . Cancer     ENDOMETRIAL CANCER  . Complication of anesthesia     PT STATES SHE WOKE UP AFTER D&C [facility].  STATES NO PROBLEMS WAKING UP AFTER HER HYSTERECTOMY  . History of radiation therapy 04/23/13, 04/29/13, 05/07/13, 05/14/13, 05/21/13    30 Gy to proximal vagina    Past Surgical History  Procedure Laterality Date  . Cesarean section      X2  . Tubal ligation    . Dilitation & currettage/hystroscopy with hydrothermal ablation N/A 01/01/2013    Procedure: DILATATION & CURETTAGE/HYSTEROSCOPY WITH ATTEMPTED HYDROTHERMAL ABLATION: Change over to [place] @ 1505;  Surgeon: Osborne Oman, MD;  Location: [place] [facility];  Service:  Gynecology;  Laterality: N/A;  . Laparoscopic assisted vaginal hysterectomy N/A 01/22/2013    Procedure: LAPAROSCOPIC ASSISTED VAGINAL HYSTERECTOMY with Bilateral Fallopian Tubes and Ovaries;  Surgeon: Osborne Oman, MD;  Location: [place] [facility];  Service: Gynecology;  Laterality: N/A;  . Robotic assisted total hysterectomy with bilateral salpingo oopherectomy Bilateral 03/31/2013    Procedure: ROBOTIC ASSISTED BILATERAL OOPHORECTOMY PELVIC AND POSSIBLE PARA-AORTIC LYMPH NODE DISSECTION;  Surgeon: Imagene Gurney A. Alycia Rossetti, MD;  Location: [facility];  Service: Gynecology;  Laterality: Bilateral;   Family History:  Family History  Problem Relation Age of Onset  . Diabetes Mother   . Hypertension Mother   . Cancer Neg Hx    Social History:  History  Alcohol Use  . 0.0 oz/week  . 0 Standard drinks or equivalent per week     History  Drug Use No    History   Social History  . Marital Status: Divorced    Spouse Name: N/A  . Number of Children: N/A  . Years of Education: N/A   Social History Main Topics  . Smoking status: Former Smoker -- 0.50 packs/day for 26 years    Types: Cigarettes    Quit date: 01/01/2014  . Smokeless tobacco: Never Used  . Alcohol Use: 0.0 oz/week    0 Standard drinks or equivalent per week  . Drug Use: No  . Sexual Activity: Not Currently    Birth Control/ Protection: Surgical   Other Topics Concern  . None   Social History Narrative   Additional History:  Sleep: Fair  Appetite:  Fair   Assessment:   Musculoskeletal: Strength & Muscle Tone: within normal limits Gait & Station: normal Patient leans: N/A   Psychiatric Specialty Exam: Physical Exam  Review of Systems  Constitutional: Negative.   HENT: Negative.   Eyes: Negative.   Respiratory: Negative.   Cardiovascular: Negative.   Gastrointestinal: Negative.   Genitourinary: Negative.   Skin: Negative.   Neurological: Negative.   Endo/Heme/Allergies: Negative.   Psychiatric/Behavioral:  Positive for depression. The patient is nervous/anxious.     Blood pressure 106/62, pulse 77, temperature 97.9 F (36.6 C), temperature source Oral, resp. rate 18, height 5\' 6"  (1.676 m), weight 112.492 kg (248 lb), last menstrual period 11/03/2012.Body mass index is 40.05 kg/(m^2).  General Appearance: Fairly Groomed  Engineer, water::  Fair  Speech:  Clear and Coherent  Volume:  fluctuates  Mood:  Anxious, Depressed and worried  Affect:  anxious worried  Thought Process:  Coherent and Goal Directed  Orientation:  Full (Time, Place, and Person)  Thought Content:  symptoms events worries concerns  Suicidal Thoughts:  No  Homicidal Thoughts:  No  Memory:  Immediate;   Fair Recent;   Fair Remote;   Fair  Judgement:  Fair  Insight:  Present  Psychomotor Activity:  Restlessness  Concentration:  Fair  Recall:  [employer] of Knowledge:Fair  Language: Fair  Akathisia:  No  Handed:  Right  AIMS (if indicated):     Assets:  Desire for Improvement Resilience Vocational/Educational  ADL's:  Intact  Cognition: WNL  Sleep:  Number of Hours: 6.25     Current Medications: Current Facility-Administered Medications  Medication Dose Route Frequency Provider Last Rate Last Dose  . acetaminophen (TYLENOL) tablet 650 mg  650 mg Oral Q6H PRN Delfin Gant, NP      . alum & mag hydroxide-simeth (MAALOX/MYLANTA) 200-200-20 MG/5ML suspension 30 mL  30 mL Oral Q4H PRN Delfin Gant, NP      . DULoxetine (CYMBALTA) DR capsule 30 mg  30 mg Oral Daily Saramma Eappen, MD   30 mg at 04/25/14 1832  . hydrOXYzine (ATARAX/VISTARIL) tablet 50 mg  50 mg Oral TID PRN Nena Polio, PA-C   50 mg at 04/26/14 1018  . magnesium hydroxide (MILK OF MAGNESIA) suspension 30 mL  30 mL Oral Daily PRN Delfin Gant, NP        Lab Results:  Results for orders placed or performed during the hospital encounter of 04/24/14 (from the past 48 hour(s))  Pregnancy, urine     Status: None   Collection Time:  04/24/14  7:14 PM  Result Value Ref Range   Preg Test, Ur NEGATIVE NEGATIVE    Comment:        THE SENSITIVITY OF THIS METHODOLOGY IS >20 mIU/mL.     Physical Findings: AIMS: Facial and Oral Movements Muscles of Facial Expression: None, normal Lips and Perioral Area: None, normal Jaw: None, normal Tongue: None, normal,Extremity Movements Upper (arms, wrists, hands, fingers): None, normal Lower (legs, knees, ankles, toes): None, normal, Trunk Movements Neck, shoulders, hips: None, normal, Overall Severity Severity of abnormal movements (highest score from questions above): None, normal Incapacitation due to abnormal movements: None, normal Patient's awareness of abnormal movements (rate only patient's report): No Awareness, Dental Status Current problems with teeth and/or dentures?: No Does patient usually wear dentures?: No  CIWA:  CIWA-Ar Total: 0 COWS:  COWS Total Score: 0  Treatment Plan Summary: Daily contact with patient to assess and  evaluate symptoms and progress in treatment and Medication management Supportive approach/copint skills Panic Disorder: will pursue the Cymbalta that will also help the depression and the pain,. Will continue the Vistaril what she reports has been effective in helping with the acute anxiety Major Depression: will pursue the Cymbalta and optimize response. She had some nausea when she took it yesterday on an empty stomach. Will offer it after diner.  Will work with CBT/mindfulness and other strategies to handle the anxiety and the depression   Medical Decision Making:  Review of Psycho-Social Stressors (1), Review or order clinical lab tests (1), Review of Medication Regimen & Side Effects (2) and Review of New Medication or Change in Dosage (2)     Johnedward Brodrick A 04/26/2014, 6:27 PM

## 2014-04-26 NOTE — Progress Notes (Signed)
D:Pt rates her depression and anxiety as a 7 on 1-10 scale with 10 being the most. Pt c/o nausea and diarrhea earlier. Pt reports symptoms started after she took first dose of cymbalta and she refused morning dose. A:Reported to MD. Pt was given Gingerale and crackers. Offered support and 15 minute checks. R:Pt denies si and hi. Pt reports that she is starting to feel better throughout the day. Safety maintained on the unit.

## 2014-04-26 NOTE — Progress Notes (Signed)
The focus of this group is to help patients review their daily goal of treatment and discuss progress on daily workbooks. Pt shared that her goal for the day was to learn coping skills for her anxiety and depression. Pt reported that she accomplished this goal in that she learned coping skills including telling herself to stop, deep breathing, and guided imagery. Pt stated these coping skills helped her a lot today. Pt shared that she was not happy about her admission to [facility] at first, but she now is happy she came. Pt stated she feels happy due to getting to know new people. Pt also reported that she feels she is making breakthroughs due to her admission at [facility].

## 2014-04-26 NOTE — Progress Notes (Signed)
Pt took a shower early in the shift and missed group time when she came to writer to report a reddened, itchy area to her R flank.  Pt was given Vistaril with good results.  Pt reports she had a fairly good day.  She denies SI/HI/AV at this time.  She plans to return home at discharge.  Discharge plans are in process.  Pt makes her needs known to staff.  She said she went to groups earlier today even though she missed the evening group.  Support and encouragement offered.  Safety maintained with q15 minute checks.

## 2014-04-26 NOTE — BHH Group Notes (Signed)
[facility] LCSW Aftercare Discharge Planning Group Note   04/26/2014 12:43 PM    Participation Quality:  Appropraite  Mood/Affect:  Appropriate  Depression Rating:  7  Anxiety Rating:  7  Thoughts of Suicide:  No  Will you contract for safety?   NA  Current AVH:  No  Plan for Discharge/Comments:  Patient attended discharge planning group and actively participated in group. Patient advised she is not having a good day.  She reports having a home but does not have outpatient services.  CSW and patient to follow up to discharge options for outpatient services.  Suicide prevention education reviewed and SPE document provided.   Transportation Means: Patient has transportation.   Supports:  Patient has a support system.   Jerrelle Michelsen, Eulas Post

## 2014-04-26 NOTE — BHH Group Notes (Signed)
[facility] LCSW Group Therapy          Overcoming Obstacles       1:15 -2:30        04/26/2014   3:36 PM    Type of Therapy:  Group Therapy  Participation Level:  Appropriate  Participation Quality:  Appropriate  Affect:  Appropriate, Alert  Cognitive:  Attentive Appropriate  Insight: Developing/Improving Engaged  Engagement in Therapy: Developing/Imprvoing Engaged  Modes of Intervention:  Discussion Exploration  Education Rapport BuildingProblem-Solving Support  Summary of Progress/Problems:  The main focus of today's group was overcoming obstacles.  She advised the obstacle she has to overcome is dealing with stressful situation.  Patient talked about the panic attack she had prior to admission and not knowing what to do to calm herself.  CSW did an exercise with group on deep breathing and relaxation.    [name] 04/26/2014   3:36 PM

## 2014-04-27 NOTE — Progress Notes (Signed)
Pt reports that she is feeling better and has not had any more episodes of n/v since this morning.  She did not take her cymbalta this morning, so she requested it this evening, and it was given.  Pt denies SI/HI/AV at this time.  Pt plans to return home at discharge.  Discharge plans are in progress.  Pt went to afternoon groups after she felt better.  Support and encouragement offered.  Pt makes her needs known to staff.  Safety maintained with q15 minute checks.

## 2014-04-27 NOTE — Progress Notes (Signed)
[facility] Group Notes:  (Nursing/MHT/Case Management/Adjunct)  Date:  04/27/2014  Time:  10:06 AM  Type of Therapy:  Nurse Education  Participation Level:  Active  Participation Quality:  Appropriate  Affect:  Appropriate  Cognitive:  Alert  Insight:  Appropriate  Engagement in Group:  Engaged  Modes of Intervention:  Clarification, Discussion, Education, Socialization and Support  Summary of Progress/Problems:The purpose of this group is to explore signs and symptoms of anxiety/depression along with coping skills. Mosie Lukes 04/27/2014, 10:06 AM

## 2014-04-27 NOTE — Progress Notes (Signed)
[facility] MD Progress Note  04/27/2014 6:15 PM Sheryl Mathis  MRN:  [id number] Subjective:  Sheryl Mathis endorses that she is starting to feel better. She has tolerated the Cymbalta well so far. She is now thinking that going off the Effexor and being started on the Wellbutrin was a caused for her increased anxiety, panic. She is worried about going back to her job as it is very stressful and she has to be on her feet all day moving around what exacerbates her leg pain. She is going to ask to work less hours.   Principal Problem: MDD (major depressive disorder), recurrent severe, without psychosis Diagnosis:   Patient Active Problem List   Diagnosis Date Noted  . Panic disorder [F41.0] 04/25/2014  . MDD (major depressive disorder), recurrent severe, without psychosis [F33.2] 04/24/2014  . Obesity (BMI 30-39.9) [E66.9] 02/01/2014  . Uterine cancer [C55] 03/31/2013  . Endometrial carcinoma [C54.1] 02/02/2013  . S/P laparoscopic assisted vaginal hysterectomy (LAVH) and bilateral salpingectomy (BS) on 01/22/13 [Z90.710] 01/22/2013   Total Time spent with patient: 30 minutes   Past Medical History:  Past Medical History  Diagnosis Date  . Anemia   . Menorrhagia   . Depression   . Anxiety   . Headache(784.0)     MIGRAINES  . Cancer     ENDOMETRIAL CANCER  . Complication of anesthesia     PT STATES SHE WOKE UP AFTER D&C [place].  STATES NO PROBLEMS WAKING UP AFTER HER HYSTERECTOMY  . History of radiation therapy 04/23/13, 04/29/13, 05/07/13, 05/14/13, 05/21/13    30 Gy to proximal vagina    Past Surgical History  Procedure Laterality Date  . Cesarean section      X2  . Tubal ligation    . Dilitation & currettage/hystroscopy with hydrothermal ablation N/A 01/01/2013    Procedure: DILATATION & CURETTAGE/HYSTEROSCOPY WITH ATTEMPTED HYDROTHERMAL ABLATION: Change over to [place] @ 1505;  Surgeon: Osborne Oman, MD;  Location: [place] [facility];  Service: Gynecology;   Laterality: N/A;  . Laparoscopic assisted vaginal hysterectomy N/A 01/22/2013    Procedure: LAPAROSCOPIC ASSISTED VAGINAL HYSTERECTOMY with Bilateral Fallopian Tubes and Ovaries;  Surgeon: Osborne Oman, MD;  Location: [place] [facility];  Service: Gynecology;  Laterality: N/A;  . Robotic assisted total hysterectomy with bilateral salpingo oopherectomy Bilateral 03/31/2013    Procedure: ROBOTIC ASSISTED BILATERAL OOPHORECTOMY PELVIC AND POSSIBLE PARA-AORTIC LYMPH NODE DISSECTION;  Surgeon: Imagene Gurney A. Alycia Rossetti, MD;  Location: [facility];  Service: Gynecology;  Laterality: Bilateral;   Family History:  Family History  Problem Relation Age of Onset  . Diabetes Mother   . Hypertension Mother   . Cancer Neg Hx    Social History:  History  Alcohol Use  . 0.0 oz/week  . 0 Standard drinks or equivalent per week     History  Drug Use No    History   Social History  . Marital Status: Divorced    Spouse Name: N/A  . Number of Children: N/A  . Years of Education: N/A   Social History Main Topics  . Smoking status: Former Smoker -- 0.50 packs/day for 26 years    Types: Cigarettes    Quit date: 01/01/2014  . Smokeless tobacco: Never Used  . Alcohol Use: 0.0 oz/week    0 Standard drinks or equivalent per week  . Drug Use: No  . Sexual Activity: Not Currently    Birth Control/ Protection: Surgical   Other Topics Concern  . None  Social History Narrative   Additional History:    Sleep: Fair  Appetite:  Fair   Assessment:   Musculoskeletal: Strength & Muscle Tone: within normal limits Gait & Station: normal Patient leans: N/A   Psychiatric Specialty Exam: Physical Exam  Review of Systems  Constitutional: Negative.   HENT: Negative.   Eyes: Negative.   Respiratory: Negative.   Cardiovascular: Negative.   Gastrointestinal: Negative.   Genitourinary: Negative.   Musculoskeletal: Negative.   Skin: Negative.   Neurological: Negative.   Endo/Heme/Allergies: Negative.    Psychiatric/Behavioral: Positive for depression. The patient is nervous/anxious.     Blood pressure 117/70, pulse 75, temperature 98.3 F (36.8 C), temperature source Oral, resp. rate 20, height 5\' 6"  (1.676 m), weight 112.492 kg (248 lb), last menstrual period 11/03/2012.Body mass index is 40.05 kg/(m^2).  General Appearance: Fairly Groomed  Engineer, water::  Fair  Speech:  Clear and Coherent  Volume:  Normal  Mood:  Anxious and worried  Affect:  anxious worried  Thought Process:  Coherent and Goal Directed  Orientation:  Full (Time, Place, and Person)  Thought Content:  symptoms events worries concerns  Suicidal Thoughts:  No  Homicidal Thoughts:  No  Memory:  Immediate;   Fair Recent;   Fair Remote;   Fair  Judgement:  Fair  Insight:  Present  Psychomotor Activity:  Restlessness  Concentration:  Fair  Recall:  [employer] of Knowledge:Fair  Language: Fair  Akathisia:  No  Handed:  Right  AIMS (if indicated):     Assets:  Desire for Improvement Housing Vocational/Educational  ADL's:  Intact  Cognition: WNL  Sleep:  Number of Hours: 6.75     Current Medications: Current Facility-Administered Medications  Medication Dose Route Frequency Provider Last Rate Last Dose  . acetaminophen (TYLENOL) tablet 650 mg  650 mg Oral Q6H PRN Delfin Gant, NP   650 mg at 04/27/14 1259  . alum & mag hydroxide-simeth (MAALOX/MYLANTA) 200-200-20 MG/5ML suspension 30 mL  30 mL Oral Q4H PRN Delfin Gant, NP      . DULoxetine (CYMBALTA) DR capsule 30 mg  30 mg Oral Daily Ursula Alert, MD   30 mg at 04/27/14 0758  . hydrOXYzine (ATARAX/VISTARIL) tablet 50 mg  50 mg Oral TID PRN Nena Polio, PA-C   50 mg at 04/27/14 1435  . magnesium hydroxide (MILK OF MAGNESIA) suspension 30 mL  30 mL Oral Daily PRN Delfin Gant, NP        Lab Results:  Results for orders placed or performed during the hospital encounter of 04/24/14 (from the past 48 hour(s))  TSH     Status: None    Collection Time: 04/26/14  7:25 PM  Result Value Ref Range   TSH 1.309 0.350 - 4.500 uIU/mL    Comment: Performed at [facility]    Physical Findings: AIMS: Facial and Oral Movements Muscles of Facial Expression: None, normal Lips and Perioral Area: None, normal Jaw: None, normal Tongue: None, normal,Extremity Movements Upper (arms, wrists, hands, fingers): None, normal Lower (legs, knees, ankles, toes): None, normal, Trunk Movements Neck, shoulders, hips: None, normal, Overall Severity Severity of abnormal movements (highest score from questions above): None, normal Incapacitation due to abnormal movements: None, normal Patient's awareness of abnormal movements (rate only patient's report): No Awareness, Dental Status Current problems with teeth and/or dentures?: No Does patient usually wear dentures?: No  CIWA:  CIWA-Ar Total: 1 COWS:  COWS Total Score: 1  Treatment Plan Summary: Daily contact  with patient to assess and evaluate symptoms and progress in treatment and Medication management Supportive approach/coping skills Major Depression: will continue to use the Cymbalta. She did not have the stomach upset she experienced the first time she took it. Seems that as long as she takes it with food she will be fine Will work on CBT;mindfulness to help handle the stress from work Will work on identifying life style changes that she can make to better manage her depression/anxiety/stress level: like exercising, going to church, see a Engineer, site. Medical Decision Making:  Review of Psycho-Social Stressors (1), Review of Medication Regimen & Side Effects (2) and Review of New Medication or Change in Dosage (2)     Randee Upchurch A 04/27/2014, 6:15 PM
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
## 2014-04-27 NOTE — Plan of Care (Signed)
Problem: Consults Goal: Depression Patient Education See Patient Education Module for education specifics.  Outcome: Completed/Met Date Met:  04/27/14 Nurse discussed depression with patient.

## 2014-04-27 NOTE — Progress Notes (Signed)
D:  Patient's self inventory sheet, patient has fair sleep, no sleep medication given.  Good appetite, normal energy level, good concentration.  Rated depression 4, hopeless 1, anxiety 4.  Deieid withdrawals.  Denied physical problem.  Goal is to work on getting home tomorrow.  Plans to see how meds will do.  "I come in very heavy feeling and depressed.  Thankful to be feeling like myself today.  First day got up at call time!" A:  Medications administered per MD orders.  Emotional support and encouragement given patient. R:  Denied SI and HI, contracts for safety.  Denied A/V hallucinations.  Safety maintained with 15 minute checks. Patient stated she is feeling better today.  Has been very depressed, continues to think about her ex-husband, how she took care of him while he was going through cancer treatment, and then he left her for another man.  Mom deceased, does have emotional support from a friend's mother during the hard times.

## 2014-04-27 NOTE — BHH Group Notes (Signed)
The focus of this group is to educate the patient on the purpose and policies of crisis stabilization and provide a format to answer questions about their admission.  The group details unit policies and expectations of patients while admitted.  Patient attended 0900 nurse education orientation this morning.  Patient actively participated, appropriate affect, alert, appropriate insight and engagement.  Today patient will work on 3 goals for discharge.
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
## 2014-04-28 MED ORDER — HYDROXYZINE HCL 50 MG PO TABS: 50.0000 mg | ORAL_TABLET | Freq: Three times a day (TID) | ORAL | Status: DC | PRN

## 2014-04-28 MED ORDER — DULOXETINE HCL 30 MG PO CPEP: 30.0000 mg | ORAL_CAPSULE | Freq: Every day | ORAL | Status: DC

## 2014-04-28 NOTE — Progress Notes (Signed)
  [facility] Adult Case Management Discharge Plan :  Will you be returning to the same living situation after discharge:  Yes,  Patient is returning home. At discharge, do you have transportation home?: Yes,  Patient will arrange transportation. Do you have the ability to pay for your medications: No.  Patient needs assistance with indigent medications   Release of information consent forms completed and in the chart;  Patient's signature needed at discharge.  Patient to Follow up at: Follow-up Information    Follow up with [facility] On 04/30/2014.   Why:  Pleae go to [facility] walk in clinic on Friday, April 30, 2014 or any weekday between [place] - 12Noon or 1PM -3PM for [employer] information:   [address] [place], [place]      Patient denies SI/HI: Patient no longer endorsing SI/HI or other thoughts of self harm.     Safety Planning and Suicide Prevention discussed: .Reviewed with all patients during discharge planning group   Have you used any form of tobacco in the last 30 days? (Cigarettes, Smokeless Tobacco, Cigars, and/or Pipes): No  Has patient been referred to the Quitline?: No referral to Quitline needed.  [name] 04/28/2014, 10:16 AM

## 2014-04-28 NOTE — Progress Notes (Signed)
Patient ID: Sheryl Mathis, female   DOB: 1970/07/19, 44 y.o.   MRN: [id number]  Pt discharged home with a personal friend. Pt was stable and appreciative. All papers and prescriptions were given and valuables returned. Verbal understanding expressed. Denies SI/HI and A/VH. Pt given opportunity to express concerns and ask questions. Pt able to verbalize coping skills for depression to utilize outside of the facility.

## 2014-04-28 NOTE — BHH Group Notes (Signed)
[facility] LCSW Aftercare Discharge Planning Group Note   04/28/2014 10:14 AM    Participation Quality:  Appropraite  Mood/Affect:  Appropriate  Depression Rating:  2  Anxiety Rating:  3  Thoughts of Suicide:  No  Will you contract for safety?   NA  Current AVH:  No  Plan for Discharge/Comments:  Patient attended discharge planning group and actively participated in group.  Patient is returning to her home and will follow up with [facility].  Suicide prevention education reviewed and SPE document provided.   Transportation Means: Patient has transportation.   Supports:  Patient has a support system.   Blossom Crume, Eulas Post

## 2014-04-28 NOTE — Progress Notes (Signed)
Pt reports she is doing much better and states she is supposed to discharge tomorrow.  She feels the medications are working for her.  She reports the Vistaril is helping decrease her panic attacks.  She states she has been going to groups and they are helpful.  She denies SI/HI/AV.  Her plan is to return home, continue taking her meds, and return to her job, but reduce her hours because of how much time she has to spend on her feet.  Pt is pleasant and cooperative with staff.  Pt makes her needs known to staff.  Support and encouragement offered.  Safety maintained with q15 minute checks.

## 2014-04-28 NOTE — Progress Notes (Signed)
Patient ID: Sheryl Mathis, female   DOB: [birth year], 44 y.o.   MRN: [id number]  Pt currently presents with an appropriate affect and anxious behavior. Per self inventory, pt rates depression at a 2, hopelessness 0 and anxiety 3. Pt's daily goal is to "going home" and they intend to do so by "gettig everything lined up for follow up care." Pt reports to writer that, "my "adopted mother" has told me that she is making cornbread and other good food for when I gt home. She's really supportive and takes care of me." Pt reports fair sleep, good concentration and a good appetite.   Pt provided with medications per providers orders. Pt's labs and vitals were monitored throughout the day. Pt supported emotionally and encouraged to express concerns and questions. Pt educated on medications.  Pt's safety ensured with 15 minute and environmental checks. Pt currently denies SI/HI and A/V hallucinations. Pt verbally agrees to seek staff if SI/HI or A/VH occurs and to consult with staff before acting on these thoughts. Pt reports "Enjoyed my experience here." Pt asks about labs while with the MD and MD asked writer to print off a current lab result sheet.

## 2014-04-28 NOTE — Discharge Summary (Signed)
Physician Discharge Summary Note  Patient:  Sheryl Mathis is an 44 y.o., female MRN:  [id number] DOB:  09/20/1970 Patient phone:  [phone] (home)  Patient address:   [address] [place] [address],  Total Time spent with patient: 30 minutes  Date of Admission:  04/24/2014 Date of Discharge: 04/28/2014  Reason for Admission:  Depression  Principal Problem: MDD (major depressive disorder), recurrent severe, without psychosis Discharge Diagnoses: Patient Active Problem List   Diagnosis Date Noted  . Panic disorder [F41.0] 04/25/2014  . MDD (major depressive disorder), recurrent severe, without psychosis [F33.2] 04/24/2014  . Obesity (BMI 30-39.9) [E66.9] 02/01/2014  . Uterine cancer [C55] 03/31/2013  . Endometrial carcinoma [C54.1] 02/02/2013  . S/P laparoscopic assisted vaginal hysterectomy (LAVH) and bilateral salpingectomy (BS) on 01/22/13 [Z90.710] 01/22/2013    Musculoskeletal: Strength & Muscle Tone: within normal limits Gait & Station: normal Patient leans: N/A  Psychiatric Specialty Exam:  SEE SRA Physical Exam  Vitals reviewed. Psychiatric: She has a normal mood and affect.    Review of Systems  Constitutional: Negative.   HENT: Negative.   Eyes: Negative.   Respiratory: Negative.   Cardiovascular: Negative.   Gastrointestinal: Negative.   Genitourinary: Negative.   Musculoskeletal: Negative.   Skin: Negative.   Neurological: Negative.   Endo/Heme/Allergies: Negative.   Psychiatric/Behavioral: The patient is nervous/anxious.     Blood pressure 117/67, pulse 59, temperature 97.8 F (36.6 C), temperature source Oral, resp. rate 20, height 5\' 6"  (1.676 m), weight 112.492 kg (248 lb), last menstrual period 11/03/2012.Body mass index is 40.05 kg/(m^2).  Past Medical History:  Past Medical History  Diagnosis Date  . Anemia   . Menorrhagia   . Depression   . Anxiety   . Headache(784.0)     MIGRAINES  . Cancer     ENDOMETRIAL CANCER  .  Complication of anesthesia     PT STATES SHE WOKE UP AFTER D&C [place].  STATES NO PROBLEMS WAKING UP AFTER HER HYSTERECTOMY  . History of radiation therapy 04/23/13, 04/29/13, 05/07/13, 05/14/13, 05/21/13    30 Gy to proximal vagina    Past Surgical History  Procedure Laterality Date  . Cesarean section      X2  . Tubal ligation    . Dilitation & currettage/hystroscopy with hydrothermal ablation N/A 01/01/2013    Procedure: DILATATION & CURETTAGE/HYSTEROSCOPY WITH ATTEMPTED HYDROTHERMAL ABLATION: Change over to [place] @ 1505;  Surgeon: Osborne Oman, MD;  Location: [facility];  Service: Gynecology;  Laterality: N/A;  . Laparoscopic assisted vaginal hysterectomy N/A 01/22/2013    Procedure: LAPAROSCOPIC ASSISTED VAGINAL HYSTERECTOMY with Bilateral Fallopian Tubes and Ovaries;  Surgeon: Osborne Oman, MD;  Location: [facility];  Service: Gynecology;  Laterality: N/A;  . Robotic assisted total hysterectomy with bilateral salpingo oopherectomy Bilateral 03/31/2013    Procedure: ROBOTIC ASSISTED BILATERAL OOPHORECTOMY PELVIC AND POSSIBLE PARA-AORTIC LYMPH NODE DISSECTION;  Surgeon: Imagene Gurney A. Alycia Rossetti, MD;  Location: [facility];  Service: Gynecology;  Laterality: Bilateral;   Family History:  Family History  Problem Relation Age of Onset  . Diabetes Mother   . Hypertension Mother   . Cancer Neg Hx    Social History:  History  Alcohol Use  . 0.0 oz/week  . 0 Standard drinks or equivalent per week     History  Drug Use No    History   Social History  . Marital Status: Divorced    Spouse Name: N/A  . Number of Children:  N/A  . Years of Education: N/A   Social History Main Topics  . Smoking status: Former Smoker -- 0.50 packs/day for 26 years    Types: Cigarettes    Quit date: 01/01/2014  . Smokeless tobacco: Never Used  . Alcohol Use: 0.0 oz/week    0 Standard drinks or equivalent per week  . Drug Use: No  . Sexual Activity: Not Currently    Birth  Control/ Protection: Surgical   Other Topics Concern  . None   Social History Narrative    Past Psychiatric History: Hospitalizations:  Outpatient Care:  Substance Abuse Care:  Self-Mutilation:  Suicidal Attempts:  Violent Behaviors:   Risk to Self: Is patient at risk for suicide?: No What has been your use of drugs/alcohol within the last 12 months?: Denies Risk to Others:   Prior Inpatient Therapy:   Prior Outpatient Therapy:    Level of Care:  OP  Hospital Course:  Sheryl Mathis was admitted for MDD (major depressive disorder), recurrent severe, without psychosis and crisis management.  She was treated with Duloxetine for depression.  Medical problems were identified and treated as needed.  Home medications were restarted as appropriate.  Improvement was monitored by observation and Sheryl Mathis daily report of symptom reduction.  Emotional and mental status was monitored by daily self inventory reports completed by Sheryl Mathis and clinical staff.         Sheryl Mathis was evaluated by the treatment team for stability and plans for continued recovery upon discharge.  She was offered further treatment options upon discharge including Residential, Intensive Outpatient and Outpatient treatment.  She will follow up with [facility] for medication management and counseling.     Sheryl Mathis motivation was an integral factor for scheduling further treatment.  Employment, transportation, bed availability, health status, family support, and any pending legal issues were also considered during her hospital stay.  Upon completion of this admission the patient was both mentally and medically stable for discharge denying suicidal/homicidal ideation, auditory/visual/tactile hallucinations, delusional thoughts and paranoia.      Consults:  psychiatry  Significant Diagnostic Studies:  labs: per ED  Discharge Vitals:   Blood pressure 117/67, pulse 59, temperature 97.8 F (36.6 C),  temperature source Oral, resp. rate 20, height 5\' 6"  (1.676 m), weight 112.492 kg (248 lb), last menstrual period 11/03/2012. Body mass index is 40.05 kg/(m^2). Lab Results:   Results for orders placed or performed during the hospital encounter of 04/24/14 (from the past 72 hour(s))  TSH     Status: None   Collection Time: 04/26/14  7:25 PM  Result Value Ref Range   TSH 1.309 0.350 - 4.500 uIU/mL    Comment: Performed at [facility]    Physical Findings: AIMS: Facial and Oral Movements Muscles of Facial Expression: None, normal Lips and Perioral Area: None, normal Jaw: None, normal Tongue: None, normal,Extremity Movements Upper (arms, wrists, hands, fingers): None, normal Lower (legs, knees, ankles, toes): None, normal, Trunk Movements Neck, shoulders, hips: None, normal, Overall Severity Severity of abnormal movements (highest score from questions above): None, normal Incapacitation due to abnormal movements: None, normal Patient's awareness of abnormal movements (rate only patient's report): No Awareness, Dental Status Current problems with teeth and/or dentures?: No Does patient usually wear dentures?: No  CIWA:  CIWA-Ar Total: 1 COWS:  COWS Total Score: 1   See Psychiatric Specialty Exam and Suicide Risk Assessment completed by Attending Physician prior to discharge.  Discharge destination:  Home  Is patient on multiple antipsychotic therapies at discharge:  No   Has Patient had three or more failed trials of antipsychotic monotherapy by history:  No Recommended Plan for Multiple Antipsychotic Therapies: NA    Medication List    STOP taking these medications        amitriptyline 25 MG tablet  Commonly known as:  ELAVIL     buPROPion 150 MG 24 hr tablet  Commonly known as:  WELLBUTRIN XL     ibuprofen 800 MG tablet  Commonly known as:  ADVIL,MOTRIN     MULTIVITAL PO     nicotine 21 mg/24hr patch  Commonly known as:  NICODERM CQ - dosed in mg/24 hours      PROBIOTIC DAILY PO      TAKE these medications      Indication   DULoxetine 30 MG capsule  Commonly known as:  CYMBALTA  Take 1 capsule (30 mg total) by mouth daily.   Indication:  Major Depressive Disorder     hydrOXYzine 50 MG tablet  Commonly known as:  ATARAX/VISTARIL  Take 1 tablet (50 mg total) by mouth 3 (three) times daily as needed for itching or anxiety.   Indication:  Anxiety Neurosis       Follow-up Information    Follow up with [facility] On 04/30/2014.   Why:  Pleae go to [facility] walk in clinic on Friday, April 30, 2014 or any weekday between [place] - 12Noon or 1PM -3PM for [employer] information:   [address] [place], [place]      Follow-up recommendations:  Activity:  as tol, diet as tol  Comments:  1.  Take all your medications as prescribed.              2.  Report any adverse side effects to outpatient provider.                       3.  Patient instructed to not use alcohol or illegal drugs while on prescription medicines.            4.  In the event of worsening symptoms, instructed patient to call 911, the crisis hotline or go to nearest emergency room for evaluation of symptoms.  Total Discharge Time:  30 min  Signed: Kerrie Buffalo MAY, AGNP-BC 04/28/2014, 4:43 PM  I personally assessed the patient and formulated the plan Geralyn Flash A. Sabra Heck, M.D.

## 2014-04-28 NOTE — BHH Suicide Risk Assessment (Signed)
[facility] Discharge Suicide Risk Assessment   Demographic Factors:  Caucasian  Total Time spent with patient: 30 minutes  Musculoskeletal: Strength & Muscle Tone: within normal limits Gait & Station: normal Patient leans: N/A  Psychiatric Specialty Exam: Physical Exam  Review of Systems  Constitutional: Negative.   HENT: Negative.   Eyes: Negative.   Respiratory: Negative.   Cardiovascular: Negative.   Gastrointestinal: Negative.   Genitourinary: Negative.   Musculoskeletal: Negative.   Skin: Negative.   Neurological: Negative.   Endo/Heme/Allergies: Negative.   Psychiatric/Behavioral: Positive for depression. The patient is nervous/anxious.     Blood pressure 117/67, pulse 59, temperature 97.8 F (36.6 C), temperature source Oral, resp. rate 20, height 5\' 6"  (1.676 m), weight 112.492 kg (248 lb), last menstrual period 11/03/2012.Body mass index is 40.05 kg/(m^2).  General Appearance: Fairly Groomed  Engineer, water::  Fair  Speech:  Clear and [id number]  Volume:  Normal  Mood:  Euthymic  Affect:  Appropriate  Thought Process:  Coherent and Goal Directed  Orientation:  Full (Time, Place, and Person)  Thought Content:  plans as she moves on  Suicidal Thoughts:  No  Homicidal Thoughts:  No  Memory:  Immediate;   Fair Recent;   Fair Remote;   Fair  Judgement:  Fair  Insight:  Present  Psychomotor Activity:  Normal  Concentration:  Fair  Recall:  [employer] [place]  Language: Fair  Akathisia:  No  Handed:  Right  AIMS (if indicated):     Assets:  Desire for Improvement Housing Social Support Vocational/Educational  Sleep:  Number of Hours: 6.75  Cognition: WNL  ADL's:  Intact   Have you used any form of tobacco in the last 30 days? (Cigarettes, Smokeless Tobacco, Cigars, and/or Pipes): No  Has this patient used any form of tobacco in the last 30 days? (Cigarettes, Smokeless Tobacco, Cigars, and/or Pipes) No  Mental Status Per Nursing Assessment::   On  Admission:     Current Mental Status by Physician: Kimarie is in full contact with reality. There are no active SI plans or intent. She states so far the medication is agreeing with her, reports no side effects. She feels more hopeful. She will continue to work on her issues on an outpatient basis. The main concern right now is work, not sure if she can handle the stress from right now.    Loss Factors: Decline in physical health  Historical Factors: NA  Risk Reduction Factors:   Sense of responsibility to family, Employed, Living with another person, especially a relative and Positive social support  Continued Clinical Symptoms:  Panic Attacks Depression:   Insomnia  Cognitive Features That Contribute To Risk:  Closed-mindedness, Polarized thinking and Thought constriction (tunnel vision)    Suicide Risk:  Minimal: No identifiable suicidal ideation.  Patients presenting with no risk factors but with morbid ruminations; may be classified as minimal risk based on the severity of the depressive symptoms  Principal Problem: MDD (major depressive disorder), recurrent severe, without psychosis Discharge Diagnoses:  Patient Active Problem List   Diagnosis Date Noted  . Panic disorder [F41.0] 04/25/2014  . MDD (major depressive disorder), recurrent severe, without psychosis [F33.2] 04/24/2014  . Obesity (BMI 30-39.9) [E66.9] 02/01/2014  . Uterine cancer [C55] 03/31/2013  . Endometrial carcinoma [C54.1] 02/02/2013  . S/P laparoscopic assisted vaginal hysterectomy (LAVH) and bilateral salpingectomy (BS) on 01/22/13 [Z90.710] 01/22/2013    Follow-up Information    Follow up with [facility] On 04/30/2014.   Why:  Pleae go to [facility] walk in clinic on Friday, April 30, 2014 or any weekday between [place] - 12Noon or 1PM -3PM for [employer] information:   [address] [place], [place]      Plan Of Care/Follow-up  recommendations:  Activity:  as tolerated Diet:  regular Follow up [facility] as above Is patient on multiple antipsychotic therapies at discharge:  No   Has Patient had three or more failed trials of antipsychotic monotherapy by history:  No  Recommended Plan for Multiple Antipsychotic Therapies: NA    Yolande Skoda A 04/28/2014, 2:00 PM

## 2014-05-03 NOTE — Progress Notes (Signed)
Patient Discharge Instructions:  After Visit Summary (AVS):   Faxed to:  05/03/14 Discharge Summary Note:   Faxed to:  05/03/14 Psychiatric Admission Assessment Note:   Faxed to:  05/03/14 Suicide Risk Assessment - Discharge Assessment:   Faxed to:  05/03/14 Faxed/Sent to the Next Level Care provider:  05/03/14 Faxed to [facility] [place], 05/03/2014, 4:07 PM

## 2014-05-06 ENCOUNTER — Encounter: Payer: Self-pay | Admitting: Radiation Oncology

## 2014-05-06 ENCOUNTER — Ambulatory Visit
Admission: RE | Admit: 2014-05-06 | Discharge: 2014-05-06 | Disposition: A | Discharge status: Home / Self Care | Payer: No Typology Code available for payment source | Source: Ambulatory Visit | Attending: Radiation Oncology | Admitting: Radiation Oncology

## 2014-05-06 ENCOUNTER — Other Ambulatory Visit (HOSPITAL_COMMUNITY)
Admission: RE | Admit: 2014-05-06 | Discharge: 2014-05-06 | Disposition: A | Discharge status: Home / Self Care | Payer: No Typology Code available for payment source | Source: Ambulatory Visit | Attending: Radiation Oncology | Admitting: Radiation Oncology

## 2014-05-06 VITALS — BP 112/60 | HR 62 | Temp 98.4°F | Resp 12 | Wt 251.5 lb

## 2014-05-06 DIAGNOSIS — Z01411 Encounter for gynecological examination (general) (routine) with abnormal findings: Secondary | ICD-10-CM | POA: Insufficient documentation

## 2014-05-06 DIAGNOSIS — Z1151 Encounter for screening for human papillomavirus (HPV): Secondary | ICD-10-CM | POA: Insufficient documentation

## 2014-05-06 DIAGNOSIS — C541 Malignant neoplasm of endometrium: Secondary | ICD-10-CM

## 2014-05-06 DIAGNOSIS — R87810 Cervical high risk human papillomavirus (HPV) DNA test positive: Secondary | ICD-10-CM | POA: Insufficient documentation

## 2014-05-06 NOTE — Progress Notes (Signed)
  Radiation Oncology         [phone] ________________________________  Name: Sheryl Mathis MRN: [id number]  Date: 05/06/2014  DOB: Feb 03, 1971  Follow-Up Visit Note  CC: No primary care provider on file.  Sheryl Mathis., MD    ICD-9-CM ICD-10-CM   1. 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  Interval history is significant for the patient undergoing colposcopy and vaginal biopsy last November. No evidence of malignancy was recovered at that time. The patient is also had problems with depression and was admitted to behavioral health last month. 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  Patient has not been using her vaginal dilator in light of above events. I've encouraged her to restart this procedure. She denies any vaginal bleeding pelvic pain hematuria or rectal bleeding.                              ALLERGIES:  has No Known Allergies.  Meds: Current Outpatient Prescriptions  Medication Sig Dispense Refill  . DULoxetine (CYMBALTA) 30 MG capsule Take 1 capsule (30 mg total) by mouth daily. 30 capsule 0  . hydrOXYzine (ATARAX/VISTARIL) 50 MG tablet Take 1 tablet (50 mg total) by mouth 3 (three) times daily as needed for itching or anxiety. 30 tablet 0   No current facility-administered medications for this encounter.    Physical Findings: The patient is in no acute distress. Patient is alert and oriented.  weight is 251 lb 8 oz (114.08 kg). Her oral temperature is 98.4 F (36.9 C). Her blood pressure is 112/60 and her pulse is 62. Her respiration is 12 and oxygen  saturation is 99%. .  No palpable subclavicular or axillary adenopathy area the lungs are clear to auscultation. The heart has a regular rhythm and rate. The abdomen is soft and nontender with normal bowel sounds. No inguinal adenopathy is appreciated. On examination the external genitalia are unremarkable. A speculum exam is performed. No mucosal lesions noted in the vaginal vault. A Pap smear was obtained of the proximal vagina. On bimanual and rectovaginal examination there no pelvic masses appreciated. A 1 cm boil noted in the right axillary area without bleeding or drainage at this time. This area is tender with palpation.  Lab Findings: Lab Results  Component Value Date   WBC 7.2 04/24/2014   HGB 14.8 04/24/2014   HCT 43.9 04/24/2014   MCV 86.6 04/24/2014   PLT 375 04/24/2014    Radiographic Findings: No results found.  Impression:  No evidence of recurrence on clinical exam today, Pap smear pending  Plan:  Routine follow-up in 6 months. In the interim the patient will be seen by gynecologic oncology.  ____________________________________ Blair Promise, MD

## 2014-05-06 NOTE — Progress Notes (Signed)
She rates her pain as a 3 on a scale of 0-10 and mild. constant, burning and aching over right axilla. Reports she has a boil. Pt complains of fatigue, weakness and loss of sleep. Reports denies urinary abnormalities.  Reports her stool is slight lighter in color, possibly due to medication.   Denies vaginal bleeding, discolor or pain.  Is not longer using her vaginal dilator.   Reports on Feb 20th had a "panic attack" which she became numb,broke out in sweats and vigorously scratched her arms.  She called a friend who took her too the [facility] long ER and then was transferred to behavioral health.  She was admitted until Feb 24t0 for depression and anxiety. She is currently going to start outpatient therapy on 05/12/14.   BP 112/60 mmHg  Pulse 62  Temp(Src) 98.4 F (36.9 C) (Oral)  Resp 12  Wt 251 lb 8 oz (114.08 kg)  SpO2 99%  LMP 11/03/2012

## 2014-05-07 LAB — CYTOLOGY - PAP

## 2014-05-13 ENCOUNTER — Telehealth: Payer: Self-pay | Admitting: Oncology

## 2014-05-13 NOTE — Telephone Encounter (Signed)
Left a message for Sheryl Mathis regarding her pap smear results.  Requested a return call.

## 2014-05-14 ENCOUNTER — Telehealth: Payer: Self-pay | Admitting: Oncology

## 2014-05-14 NOTE — Telephone Encounter (Signed)
Called Rasa and notified her of her pap smear results per Dr. Sondra Come.  Advised her that they were similar to last time and that she would not need another biopsy per Dr. Sondra Come.  Grecia verbalized understanding.  Encouraged her to call if she needs anything.

## 2014-06-06 ENCOUNTER — Encounter (HOSPITAL_COMMUNITY): Payer: Self-pay | Admitting: Emergency Medicine

## 2014-06-06 ENCOUNTER — Emergency Department (HOSPITAL_COMMUNITY): Payer: No Typology Code available for payment source

## 2014-06-06 ENCOUNTER — Emergency Department (HOSPITAL_COMMUNITY)
Admission: EM | Admit: 2014-06-06 | Discharge: 2014-06-06 | Disposition: A | Discharge status: Home / Self Care | Payer: No Typology Code available for payment source | Attending: Emergency Medicine | Admitting: Emergency Medicine

## 2014-06-06 DIAGNOSIS — Z79899 Other long term (current) drug therapy: Secondary | ICD-10-CM | POA: Insufficient documentation

## 2014-06-06 DIAGNOSIS — F329 Major depressive disorder, single episode, unspecified: Secondary | ICD-10-CM | POA: Insufficient documentation

## 2014-06-06 DIAGNOSIS — R609 Edema, unspecified: Secondary | ICD-10-CM

## 2014-06-06 DIAGNOSIS — F32A Depression, unspecified: Secondary | ICD-10-CM

## 2014-06-06 DIAGNOSIS — R21 Rash and other nonspecific skin eruption: Secondary | ICD-10-CM

## 2014-06-06 DIAGNOSIS — F419 Anxiety disorder, unspecified: Secondary | ICD-10-CM

## 2014-06-06 DIAGNOSIS — Z8542 Personal history of malignant neoplasm of other parts of uterus: Secondary | ICD-10-CM | POA: Insufficient documentation

## 2014-06-06 DIAGNOSIS — Z923 Personal history of irradiation: Secondary | ICD-10-CM | POA: Insufficient documentation

## 2014-06-06 DIAGNOSIS — Z87891 Personal history of nicotine dependence: Secondary | ICD-10-CM | POA: Insufficient documentation

## 2014-06-06 DIAGNOSIS — Z862 Personal history of diseases of the blood and blood-forming organs and certain disorders involving the immune mechanism: Secondary | ICD-10-CM | POA: Insufficient documentation

## 2014-06-06 LAB — COMPREHENSIVE METABOLIC PANEL
ALT: 39 U/L — AB (ref 0–35)
AST: 39 U/L — ABNORMAL HIGH (ref 0–37)
Albumin: 4 g/dL (ref 3.5–5.2)
Alkaline Phosphatase: 71 U/L (ref 39–117)
Anion gap: 11 (ref 5–15)
BILIRUBIN TOTAL: 0.3 mg/dL (ref 0.3–1.2)
BUN: 23 mg/dL (ref 6–23)
CO2: 25 mmol/L (ref 19–32)
Calcium: 9.4 mg/dL (ref 8.4–10.5)
Chloride: 103 mmol/L (ref 96–112)
Creatinine, Ser: 0.71 mg/dL (ref 0.50–1.10)
GFR calc Af Amer: >90 mL/min (ref >90)
GFR calc non Af Amer: >90 mL/min (ref >90)
Glucose, Bld: 102 mg/dL — ABNORMAL HIGH (ref 70–99)
Potassium: 4 mmol/L (ref 3.5–5.1)
SODIUM: 139 mmol/L (ref 135–145)
Total Protein: 8.2 g/dL (ref 6.0–8.3)

## 2014-06-06 LAB — ACETAMINOPHEN LEVEL: Acetaminophen (Tylenol), Serum: <10 ug/mL — ABNORMAL LOW (ref 10–30)

## 2014-06-06 LAB — CBC
HEMATOCRIT: 39.9 % (ref 36.0–46.0)
Hemoglobin: 13 g/dL (ref 12.0–15.0)
MCH: 28.4 pg (ref 26.0–34.0)
MCHC: 32.6 g/dL (ref 30.0–36.0)
MCV: 87.1 fL (ref 78.0–100.0)
Platelets: 365 10*3/uL (ref 150–400)
RBC: 4.58 MIL/uL (ref 3.87–5.11)
RDW: 12.8 % (ref 11.5–15.5)
WBC: 8 10*3/uL (ref 4.0–10.5)

## 2014-06-06 LAB — RAPID URINE DRUG SCREEN, HOSP PERFORMED
Amphetamines: NOT DETECTED
BENZODIAZEPINES: NOT DETECTED
Barbiturates: NOT DETECTED
COCAINE: NOT DETECTED
Opiates: NOT DETECTED
Tetrahydrocannabinol: NOT DETECTED

## 2014-06-06 LAB — TSH: TSH: 3.899 u[IU]/mL (ref 0.350–4.500)

## 2014-06-06 LAB — ETHANOL: Alcohol, Ethyl (B): <5 mg/dL (ref 0–9)

## 2014-06-06 LAB — SALICYLATE LEVEL: Salicylate Lvl: <4 mg/dL (ref 2.8–20.0)

## 2014-06-06 LAB — BRAIN NATRIURETIC PEPTIDE: B NATRIURETIC PEPTIDE 5: 19.4 pg/mL (ref 0.0–100.0)

## 2014-06-06 LAB — I-STAT TROPONIN, ED: Troponin i, poc: 0 ng/mL (ref 0.00–0.08)

## 2014-06-06 MED ORDER — PREDNISONE 20 MG PO TABS: 40.0000 mg | ORAL_TABLET | Freq: Every day | ORAL | Status: DC

## 2014-06-06 MED ORDER — LORAZEPAM 1 MG PO TABS: 0.5000 mg | ORAL_TABLET | Freq: Three times a day (TID) | ORAL | Status: DC | PRN

## 2014-06-06 MED ORDER — DEXAMETHASONE SODIUM PHOSPHATE 10 MG/ML IJ SOLN
10.0000 mg | Freq: Once | INTRAMUSCULAR | Status: AC
Start: 2014-06-06 — End: 2014-06-06
  Administered 2014-06-06: 10 mg via INTRAMUSCULAR
  Filled 2014-06-06: qty 1

## 2014-06-06 MED ORDER — HYDROXYZINE HCL 25 MG PO TABS
25.0000 mg | ORAL_TABLET | Freq: Once | ORAL | Status: AC
  Administered 2014-06-06: 25 mg via ORAL
  Filled 2014-06-06: qty 1

## 2014-06-06 NOTE — ED Notes (Signed)
Patient states she is experiencing generalized swelling, itching on her arms, face, and anterior, abd distention, and chest pressure. Pt states symptoms started 8 days ago but got worse today.

## 2014-06-06 NOTE — ED Notes (Signed)
Pt denies HI/SI. Pt reports itchiness, swelling and hives from current medications. Pt was evaluated by PA and is requesting that pt be transferred back to Medical side. Report called to RN Di Kindle at 19:10. Bed room 24

## 2014-06-06 NOTE — ED Notes (Signed)
Pt states she's felt like she's been going downhill recently. Denies SI/HI. States she has been feeling increasingly anxious and aggrevated over the last 2 weeks and has been unable to get an appointment at her doctors office or Behavioral health. Also states she's now broken out into a rash and has been having generalized swelling that she's unsure where it's coming from.

## 2014-06-06 NOTE — Discharge Instructions (Signed)
Edema °Edema is an abnormal buildup of fluids in your body tissues. Edema is somewhat dependent on gravity to pull the fluid to the lowest place in your body. That makes the condition more common in the legs and thighs (lower extremities). Painless swelling of the feet and ankles is common and becomes more likely as you get older. It is also common in looser tissues, like around your eyes.  °When the affected area is squeezed, the fluid may move out of that spot and leave a dent for a few moments. This dent is called pitting.  °CAUSES  °There are many possible causes of edema. Eating too much salt and being on your feet or sitting for a long time can cause edema in your legs and ankles. Hot weather may make edema worse. Common medical causes of edema include: °· Heart failure. °· Liver disease. °· Kidney disease. °· Weak blood vessels in your legs. °· Cancer. °· An injury. °· Pregnancy. °· Some medications. °· Obesity.  °SYMPTOMS  °Edema is usually painless. Your skin may look swollen or shiny.  °DIAGNOSIS  °Your health care provider may be able to diagnose edema by asking about your medical history and doing a physical exam. You may need to have tests such as X-rays, an electrocardiogram, or blood tests to check for medical conditions that may cause edema.  °TREATMENT  °Edema treatment depends on the cause. If you have heart, liver, or kidney disease, you need the treatment appropriate for these conditions. General treatment may include: °· Elevation of the affected body part above the level of your heart. °· Compression of the affected body part. Pressure from elastic bandages or support stockings squeezes the tissues and forces fluid back into the blood vessels. This keeps fluid from entering the tissues. °· Restriction of fluid and salt intake. °· Use of a water pill (diuretic). These medications are appropriate only for some types of edema. They pull fluid out of your body and make you urinate more often. This  gets rid of fluid and reduces swelling, but diuretics can have side effects. Only use diuretics as directed by your health care provider. °HOME CARE INSTRUCTIONS  °· Keep the affected body part above the level of your heart when you are lying down.   °· Do not sit still or stand for prolonged periods.   °· Do not put anything directly under your knees when lying down. °· Do not wear constricting clothing or garters on your upper legs.   °· Exercise your legs to work the fluid back into your blood vessels. This may help the swelling go down.   °· Wear elastic bandages or support stockings to reduce ankle swelling as directed by your health care provider.   °· Eat a low-salt diet to reduce fluid if your health care provider recommends it.   °· Only take medicines as directed by your health care provider.  °SEEK MEDICAL CARE IF:  °· Your edema is not responding to treatment. °· You have heart, liver, or kidney disease and notice symptoms of edema. °· You have edema in your legs that does not improve after elevating them.   °· You have sudden and unexplained weight gain. °SEEK IMMEDIATE MEDICAL CARE IF:  °· You develop shortness of breath or chest pain.   °· You cannot breathe when you lie down. °· You develop pain, redness, or warmth in the swollen areas.   °· You have heart, liver, or kidney disease and suddenly get edema. °· You have a fever and your symptoms suddenly get worse. °MAKE SURE YOU:  °·   Understand these instructions.  Will watch your condition.  Will get help right away if you are not doing well or get worse. Document Released: 02/19/2005 Document Revised: 07/06/2013 Document Reviewed: 12/12/2012 [facility] Patient Information 2015 [place], [place]. This information is not intended to replace advice given to you by your health care provider. Make sure you discuss any questions you have with your health care provider.  Depression Depression refers to feeling sad, low, down in the dumps, blue, gloomy,  or empty. In general, there are two kinds of depression:  Normal sadness or normal grief. This kind of depression is one that we all feel from time to time after upsetting life experiences, such as the loss of a job or the ending of a relationship. This kind of depression is considered normal, is short lived, and resolves within a few days to 2 weeks. Depression experienced after the loss of a loved one (bereavement) often lasts longer than 2 weeks but normally gets better with time.  Clinical depression. This kind of depression lasts longer than normal sadness or normal grief or interferes with your ability to function at home, at work, and in school. It also interferes with your personal relationships. It affects almost every aspect of your life. Clinical depression is an illness. Symptoms of depression can also be caused by conditions other than those mentioned above, such as:  Physical illness. Some physical illnesses, including underactive thyroid gland (hypothyroidism), severe anemia, specific types of cancer, diabetes, uncontrolled seizures, heart and lung problems, strokes, and chronic pain are commonly associated with symptoms of depression.  Side effects of some prescription medicine. In some people, certain types of medicine can cause symptoms of depression.  Substance abuse. Abuse of alcohol and illicit drugs can cause symptoms of depression. SYMPTOMS Symptoms of normal sadness and normal grief include the following:  Feeling sad or crying for short periods of time.  Not caring about anything (apathy).  Difficulty sleeping or sleeping too much.  No longer able to enjoy the things you used to enjoy.  Desire to be by oneself all the time (social isolation).  Lack of energy or motivation.  Difficulty concentrating or remembering.  Change in appetite or weight.  Restlessness or agitation. Symptoms of clinical depression include the same symptoms of normal sadness or normal  grief and also the following symptoms:  Feeling sad or crying all the time.  Feelings of guilt or worthlessness.  Feelings of hopelessness or helplessness.  Thoughts of suicide or the desire to harm yourself (suicidal ideation).  Loss of touch with reality (psychotic symptoms). Seeing or hearing things that are not real (hallucinations) or having false beliefs about your life or the people around you (delusions and paranoia). DIAGNOSIS  The diagnosis of clinical depression is usually based on how bad the symptoms are and how long they have lasted. Your health care provider will also ask you questions about your medical history and substance use to find out if physical illness, use of prescription medicine, or substance abuse is causing your depression. Your health care provider may also order blood tests. TREATMENT  Often, normal sadness and normal grief do not require treatment. However, sometimes antidepressant medicine is given for bereavement to ease the depressive symptoms until they resolve. The treatment for clinical depression depends on how bad the symptoms are but often includes antidepressant medicine, counseling with a mental health professional, or both. Your health care provider will help to determine what treatment is best for you. Depression caused by physical  illness usually goes away with appropriate medical treatment of the illness. If prescription medicine is causing depression, talk with your health care provider about stopping the medicine, decreasing the dose, or changing to another medicine. Depression caused by the abuse of alcohol or illicit drugs goes away when you stop using these substances. Some adults need professional help in order to stop drinking or using drugs. SEEK IMMEDIATE MEDICAL CARE IF:  You have thoughts about hurting yourself or others.  You lose touch with reality (have psychotic symptoms).  You are taking medicine for depression and have a serious  side effect. FOR MORE INFORMATION  National [employer] on Mental Illness: www.nami.[employer]: https://carter.com/ Document Released: 02/17/2000 Document Revised: 07/06/2013 Document Reviewed: 05/21/2011 [facility] Patient Information 2015 [place], [place]. This information is not intended to replace advice given to you by your health care provider. Make sure you discuss any questions you have with your health care provider.  Panic Attacks Panic attacks are sudden, short-livedsurges of severe anxiety, fear, or discomfort. They may occur for no reason when you are relaxed, when you are anxious, or when you are sleeping. Panic attacks may occur for a number of reasons:   Healthy people occasionally have panic attacks in extreme, life-threatening situations, such as war or natural disasters. Normal anxiety is a protective mechanism of the body that helps [place] react to danger (fight or flight response).  Panic attacks are often seen with anxiety disorders, such as panic disorder, social anxiety disorder, generalized anxiety disorder, and phobias. Anxiety disorders cause excessive or uncontrollable anxiety. They may interfere with your relationships or other life activities.  Panic attacks are sometimes seen with other mental illnesses, such as depression and posttraumatic stress disorder.  Certain medical conditions, prescription medicines, and drugs of abuse can cause panic attacks. SYMPTOMS  Panic attacks start suddenly, peak within 20 minutes, and are accompanied by four or more of the following symptoms:  Pounding heart or fast heart rate (palpitations).  Sweating.  Trembling or shaking.  Shortness of breath or feeling smothered.  Feeling choked.  Chest pain or discomfort.  Nausea or strange feeling in your stomach.  Dizziness, light-headedness, or feeling like you will faint.  Chills or hot flushes.  Numbness or tingling in your lips or hands and  feet.  Feeling that things are not real or feeling that you are not yourself.  Fear of losing control or going crazy.  Fear of dying. Some of these symptoms can mimic serious medical conditions. For example, you may think you are having a heart attack. Although panic attacks can be very scary, they are not life threatening. DIAGNOSIS  Panic attacks are diagnosed through an assessment by your health care provider. Your health care provider will ask questions about your symptoms, such as where and when they occurred. Your health care provider will also ask about your medical history and use of alcohol and drugs, including prescription medicines. Your health care provider may order blood tests or other studies to rule out a serious medical condition. Your health care provider may refer you to a mental health professional for further evaluation. TREATMENT   Most healthy people who have one or two panic attacks in an extreme, life-threatening situation will not require treatment.  The treatment for panic attacks associated with anxiety disorders or other mental illness typically involves counseling with a mental health professional, medicine, or a combination of both. Your health care provider will help determine what treatment is best for  you.  Panic attacks due to physical illness usually go away with treatment of the illness. If prescription medicine is causing panic attacks, talk with your health care provider about stopping the medicine, decreasing the dose, or substituting another medicine.  Panic attacks due to alcohol or drug abuse go away with abstinence. Some adults need professional help in order to stop drinking or using drugs. HOME CARE INSTRUCTIONS   Take all medicines as directed by your health care provider.   Schedule and attend follow-up visits as directed by your health care provider. It is important to keep all your appointments. SEEK MEDICAL CARE IF:  You are not able to  take your medicines as prescribed.  Your symptoms do not improve or get worse. SEEK IMMEDIATE MEDICAL CARE IF:   You experience panic attack symptoms that are different than your usual symptoms.  You have serious thoughts about hurting yourself or others.  You are taking medicine for panic attacks and have a serious side effect. MAKE SURE YOU:  Understand these instructions.  Will watch your condition.  Will get help right away if you are not doing well or get worse. Document Released: 02/19/2005 Document Revised: 02/24/2013 Document Reviewed: 10/03/2012 [facility] Patient Information 2015 [place], [place]. This information is not intended to replace advice given to you by your health care provider. Make sure you discuss any questions you have with your health care provider.

## 2014-06-06 NOTE — ED Provider Notes (Signed)
CSN: [id number]     Arrival date & time 06/06/14  1747 History   First MD Initiated Contact with Patient 06/06/14 1928     Chief Complaint  Patient presents with  . Medical Clearance  . Edema  . Rash     (Consider location/radiation/quality/duration/timing/severity/associated sxs/prior Treatment) HPI   Sheryl Mathis is a(n) 44 y.o. female who presents to the emergency department with chief complaint of swelling, rash, dyspnea on exertion, anxiety and agitation. She states that she was admitted to behavioral health 6 weeks ago for depression, anxiety, SI, HI. She is discharged on Cymbalta and Vistaril. Patient has been taking her medications. About 2 weeks ago. She states that her medications ceased to work anymore and she can feel herself becoming more and more angry, depressed, agitated. She's had several panic attacks on the medication. Patient states that she went to her primary care physician twice and was told that she would have to wait 3 weeks to be seen. Patient went to [facility] mental health services in [place]. She was seen, but told again that she Quit 2 weeks to be seen by psychiatrist. The patient is very frustrated. She is having multiple panic attacks daily. The patient denies any suicidal ideation, homicidal ideation or audiovisual hallucinations. Patient is also complaining of itchy rash. The rash is located on her arms, and trunk predominantly. She denies any changes in lotions or soaps. Rash has been going on for the last 2 weeks. She denies any other contacts with similar symptoms. Patient also complains of systemic swelling in her legs, abdomen, hands. She states that by the end of the day. She has marks in her legs where her socks sit. She states that this is new. She feels a sense of heaviness and pressure on her chest. It is constant. Ongoing for the past 2 weeks. She also complains of dyspnea on exertion. Denies fevers, chills, myalgias, arthralgias. Denies chest pain radiation  to left arm, jaw or back, or diaphoresis. Denies dysuria, flank pain, suprapubic pain, frequency, urgency, or hematuria. Denies headaches, light headedness, weakness, visual disturbances. Denies abdominal pain, nausea, vomiting, diarrhea or constipation.      Past Medical History  Diagnosis Date  . Anemia   . Menorrhagia   . Depression   . Anxiety   . Headache(784.0)     MIGRAINES  . Cancer     ENDOMETRIAL CANCER  . Complication of anesthesia     PT STATES SHE WOKE UP AFTER D&C [place].  STATES NO PROBLEMS WAKING UP AFTER HER HYSTERECTOMY  . History of radiation therapy 04/23/13, 04/29/13, 05/07/13, 05/14/13, 05/21/13    30 Gy to proximal vagina   Past Surgical History  Procedure Laterality Date  . Cesarean section      X2  . Tubal ligation    . Dilitation & currettage/hystroscopy with hydrothermal ablation N/A 01/01/2013    Procedure: DILATATION & CURETTAGE/HYSTEROSCOPY WITH ATTEMPTED HYDROTHERMAL ABLATION: Change over to [place] @ 1505;  Surgeon: Osborne Oman, MD;  Location: [facility];  Service: Gynecology;  Laterality: N/A;  . Laparoscopic assisted vaginal hysterectomy N/A 01/22/2013    Procedure: LAPAROSCOPIC ASSISTED VAGINAL HYSTERECTOMY with Bilateral Fallopian Tubes and Ovaries;  Surgeon: Osborne Oman, MD;  Location: [facility];  Service: Gynecology;  Laterality: N/A;  . Robotic assisted total hysterectomy with bilateral salpingo oopherectomy Bilateral 03/31/2013    Procedure: ROBOTIC ASSISTED BILATERAL OOPHORECTOMY PELVIC AND POSSIBLE PARA-AORTIC LYMPH NODE DISSECTION;  Surgeon: Imagene Gurney A. Alycia Rossetti,  MD;  Location: [facility];  Service: Gynecology;  Laterality: Bilateral;   Family History  Problem Relation Age of Onset  . Diabetes Mother   . Hypertension Mother   . Cancer Neg Hx    History  Substance Use Topics  . Smoking status: Former Smoker -- 0.50 packs/day for 26 years    Types: Cigarettes    Quit date: 01/01/2014  . Smokeless  tobacco: Never Used  . Alcohol Use: 0.0 oz/week    0 Standard drinks or equivalent per week   OB History    Gravida Para Term Preterm AB TAB SAB Ectopic Multiple Living   2 2 2       2      Review of Systems  Ten systems reviewed and are negative for acute change, except as noted in the HPI.    Allergies  Review of patient's allergies indicates no known allergies.  Home Medications   Prior to Admission medications   Medication Sig Start Date End Date Taking? Authorizing Provider  DULoxetine (CYMBALTA) 30 MG capsule Take 1 capsule (30 mg total) by mouth daily. 04/28/14  Yes Kerrie Buffalo, NP  hydrOXYzine (ATARAX/VISTARIL) 50 MG tablet Take 1 tablet (50 mg total) by mouth 3 (three) times daily as needed for itching or anxiety. 04/28/14  Yes Kerrie Buffalo, NP   BP 127/71 mmHg  Pulse 78  Temp(Src) 98 F (36.7 C) (Oral)  Resp 16  SpO2 100%  LMP 11/03/2012 Physical Exam  Constitutional: She is oriented to person, place, and time. She appears well-developed and well-nourished. No distress.  HENT:  Head: Normocephalic and atraumatic.  Eyes: Conjunctivae are normal. No scleral icterus.  Neck: Normal range of motion.  Cardiovascular: Normal rate, regular rhythm and normal heart sounds.  Exam reveals no gallop and no friction rub.   No murmur heard. Pulmonary/Chest: Effort normal and breath sounds normal. No respiratory distress.  Abdominal: Soft. Bowel sounds are normal. She exhibits no distension and no mass. There is no tenderness. There is no guarding.  Neurological: She is alert and oriented to person, place, and time.  Skin: Skin is warm and dry. Rash noted. She is not diaphoretic.  Multiple singular papules of varying size. Some appear as wheals. + excoriation. No signs of secondary infection.   Nursing note and vitals reviewed.   ED Course  Procedures (including critical care time) Labs Review Labs Reviewed  ACETAMINOPHEN LEVEL - Abnormal; Notable for the following:     Acetaminophen (Tylenol), Serum <10.0 (*)    All other components within normal limits  COMPREHENSIVE METABOLIC PANEL - Abnormal; Notable for the following:    Glucose, Bld 102 (*)    AST 39 (*)    ALT 39 (*)    All other components within normal limits  CBC  ETHANOL  SALICYLATE LEVEL  URINE RAPID DRUG SCREEN (HOSP PERFORMED)    Imaging Review No results found.   EKG Interpretation   Date/Time:  Sunday June 06 2014 20:07:34 EDT Ventricular Rate:  58 PR Interval:  139 QRS Duration: 95 QT Interval:  396 QTC Calculation: 389 R Axis:   57 Text Interpretation:  Sinus rhythm Low voltage, precordial leads ED  PHYSICIAN INTERPRETATION AVAILABLE IN [facility] Confirmed by TEST,  Record ([id number]) on 06/08/2014 7:14:09 AM      MDM   Final diagnoses:  None   Filed Vitals:   06/06/14 2008 06/06/14 2110 06/06/14 2154 06/06/14 2238  BP: 142/63 107/61 185/72 116/88  Pulse: 73 63 90 71  Temp: 97.9 F (36.6 C)   98.3 F (36.8 C)  TempSrc: Oral   Oral  Resp: 16 16 19 18   SpO2: 97% 97% 99% 97%    Patient without ekg abnormality.  Negative troponin. Negative cxr.  Slight elevation in her liver enzymes.  PERC negative. Feel her sxs are related to anxiety.  will treat with anxiolytics until patient can see her provider for med changes. Denies SI/HI/AVH    Margarita Mail, PA-C 06/09/14 [place], MD 06/12/14 [phone]

## 2014-06-07 ENCOUNTER — Ambulatory Visit: Payer: No Typology Code available for payment source | Admitting: Radiation Oncology

## 2014-06-24 ENCOUNTER — Telehealth: Payer: Self-pay

## 2014-06-24 NOTE — Telephone Encounter (Signed)
Called to do follow up assessment. Patient stated has gain weight back since going to [employer]. Per patient has been in [employer] twice. Informed patient as long as in [place] will re screen in 16 to 18 months from initial screen.

## 2014-06-29 ENCOUNTER — Telehealth: Payer: Self-pay | Admitting: *Deleted

## 2014-06-29 NOTE — Telephone Encounter (Signed)
On 06-29-14 fax medical records to [facility] it was consult note, sim  & planning note, end of tx note, follow up note.

## 2014-08-18 ENCOUNTER — Ambulatory Visit: Payer: No Typology Code available for payment source | Admitting: Radiation Oncology

## 2014-09-02 ENCOUNTER — Ambulatory Visit: Payer: Self-pay | Attending: Gynecologic Oncology | Admitting: Gynecologic Oncology

## 2014-09-02 ENCOUNTER — Encounter: Payer: Self-pay | Admitting: Gynecologic Oncology

## 2014-09-02 VITALS — BP 139/75 | HR 72 | Temp 98.2°F | Resp 20 | Ht 66.0 in | Wt 280.0 lb

## 2014-09-02 DIAGNOSIS — R232 Flushing: Secondary | ICD-10-CM | POA: Insufficient documentation

## 2014-09-02 DIAGNOSIS — B373 Candidiasis of vulva and vagina: Secondary | ICD-10-CM | POA: Insufficient documentation

## 2014-09-02 DIAGNOSIS — B3731 Acute candidiasis of vulva and vagina: Secondary | ICD-10-CM | POA: Insufficient documentation

## 2014-09-02 DIAGNOSIS — R55 Syncope and collapse: Secondary | ICD-10-CM | POA: Insufficient documentation

## 2014-09-02 DIAGNOSIS — C541 Malignant neoplasm of endometrium: Secondary | ICD-10-CM | POA: Insufficient documentation

## 2014-09-02 MED ORDER — CLONIDINE HCL 0.1 MG PO TABS: 0.1000 mg | ORAL_TABLET | Freq: Every day | ORAL | Status: DC

## 2014-09-02 MED ORDER — IBUPROFEN 800 MG PO TABS: 800.0000 mg | ORAL_TABLET | Freq: Three times a day (TID) | ORAL | Status: DC | PRN

## 2014-09-02 MED ORDER — FLUCONAZOLE 150 MG PO TABS
150.0000 mg | ORAL_TABLET | Freq: Once | ORAL | Status: DC
Start: 2014-09-02 — End: 2014-12-16

## 2014-09-02 NOTE — Patient Instructions (Signed)
We will contact you with a date and time for your mammogram.  Plan to follow up with Dr. Skeet Latch in October or sooner if needed.  Begin taking clonidine once daily for hot flashes per Dr. Skeet Latch.  Fluconazole was also sent to your pharmacy for your yeast infection.  Please call for any questions or concerns.  Clonidine tablets What is this medicine? CLONIDINE (KLOE ni deen) is used to treat high blood pressure. This medicine may be used for other purposes; ask your health care provider or pharmacist if you have questions. COMMON BRAND NAME(S): Catapres What should I tell my health care provider before I take this medicine? They need to know if you have any of these conditions: -kidney disease -an unusual or allergic reaction to clonidine, other medicines, foods, dyes, or preservatives -pregnant or trying to get pregnant -breast-feeding How should I use this medicine? Take this medicine by mouth with a glass of water. Follow the directions on the prescription label. Take your doses at regular intervals. Do not take your medicine more often than directed. Do not suddenly stop taking this medicine. You must gradually reduce the dose or you may get a dangerous increase in blood pressure. Ask your doctor or health care professional for advice. Talk to your pediatrician regarding the use of this medicine in children. Special care may be needed. Overdosage: If you think you have taken too much of this medicine contact a poison control center or emergency room at once. NOTE: This medicine is only for you. Do not share this medicine with others. What if I miss a dose? If you miss a dose, take it as soon as you can. If it is almost time for your next dose, take only that dose. Do not take double or extra doses. What may interact with this medicine? Do not take this medicine with any of the following medications: -MAOIs like Carbex, Eldepryl, Marplan, Nardil, and Parnate This medicine may also  interact with the following medications: -barbiturate medicines for inducing sleep or treating seizures like phenobarbital -certain medicines for blood pressure, heart disease, irregular heart beat -certain medicines for depression, anxiety, or psychotic disturbances -prescription pain medicines This list may not describe all possible interactions. Give your health care provider a list of all the medicines, herbs, non-prescription drugs, or dietary supplements you use. Also tell them if you smoke, drink alcohol, or use illegal drugs. Some items may interact with your medicine. What should I watch for while using this medicine? Visit your doctor or health care professional for regular checks on your progress. Check your heart rate and blood pressure regularly while you are taking this medicine. Ask your doctor or health care professional what your heart rate should be and when you should contact him or her. You may get drowsy or dizzy. Do not drive, use machinery, or do anything that needs mental alertness until you know how this medicine affects you. To avoid dizzy or fainting spells, do not stand or sit up quickly, especially if you are an older person. Alcohol can make you more drowsy and dizzy. Avoid alcoholic drinks. Your mouth may get dry. Chewing sugarless gum or sucking hard candy, and drinking plenty of water will help. Do not treat yourself for coughs, colds, or pain while you are taking this medicine without asking your doctor or health care professional for advice. Some ingredients may increase your blood pressure. If you are going to have surgery tell your doctor or health care professional that you are  taking this medicine. What side effects may I notice from receiving this medicine? Side effects that you should report to your doctor or health care professional as soon as possible: -allergic reactions like skin rash, itching or hives, swelling of the face, lips, or tongue -anxiety,  nervousness -chest pain -depression -fast, irregular heartbeat -swelling of feet or legs -unusually weak or tired Side effects that usually do not require medical attention (report to your doctor or health care professional if they continue or are bothersome): -change in sex drive or performance -constipation -headache This list may not describe all possible side effects. Call your doctor for medical advice about side effects. You may report side effects to FDA at 1-800-FDA-1088. Where should I keep my medicine? Keep out of the reach of children. Store at room temperature between 15 and 30 degrees C (59 and 86 degrees F). Protect from light. Keep container tightly closed. Throw away any unused medicine after the expiration date. NOTE: This sheet is a summary. It may not cover all possible information. If you have questions about this medicine, talk to your doctor, pharmacist, or health care provider.  2015, Elsevier/Gold Standard. (2010-08-16 13:01:28)  Fluconazole tablets What is this medicine? FLUCONAZOLE (floo KON na zole) is an antifungal medicine. It is used to treat certain kinds of fungal or yeast infections. This medicine may be used for other purposes; ask your health care provider or pharmacist if you have questions. COMMON BRAND NAME(S): Diflucan What should I tell my health care provider before I take this medicine? They need to know if you have any of these conditions: -electrolyte abnormalities -history of irregular heart beat -kidney disease -an unusual or allergic reaction to fluconazole, other azole antifungals, medicines, foods, dyes, or preservatives -pregnant or trying to get pregnant -breast-feeding How should I use this medicine? Take this medicine by mouth. Follow the directions on the prescription label. Do not take your medicine more often than directed. Talk to your pediatrician regarding the use of this medicine in children. Special care may be needed. This  medicine has been used in children as young as 40 months of age. Overdosage: If you think you have taken too much of this medicine contact a poison control center or emergency room at once. NOTE: This medicine is only for you. Do not share this medicine with others. What if I miss a dose? If you miss a dose, take it as soon as you can. If it is almost time for your next dose, take only that dose. Do not take double or extra doses. What may interact with this medicine? Do not take this medicine with any of the following medications: -astemizole -certain medicines for irregular heart beat like dofetilide, dronedarone, quinidine -cisapride -erythromycin -lomitapide -other medicines that prolong the QT interval (cause an abnormal heart rhythm) -pimozide -terfenadine -thioridazine -tolvaptan -ziprasidone This medicine may also interact with the following medications: -antiviral medicines for HIV or AIDS -birth control pills -certain antibiotics like rifabutin, rifampin -certain medicines for blood pressure like amlodipine, isradipine, felodipine, hydrochlorothiazide, losartan, nifedipine -certain medicines for cancer like cyclophosphamide, vinblastine, vincristine -certain medicines for cholesterol like atorvastatin, lovastatin, fluvastatin, simvastatin -certain medicines for depression, anxiety, or psychotic disturbances like amitriptyline, midazolam, nortriptyline, triazolam -certain medicines for diabetes like glipizide, glyburide, tolbutamide -certain medicines for pain like alfentanil, fentanyl, methadone -certain medicines for seizures like carbamazepine, phenytoin -certain medicines that treat or prevent blood clots like warfarin -halofantrine -medicines that lower your chance of fighting infection like cyclosporine, prednisone, tacrolimus -NSAIDS, medicines  for pain and inflammation, like celecoxib, diclofenac, flurbiprofen, ibuprofen, meloxicam, naproxen -other medicines for  fungal infections -sirolimus -theophylline -tofacitinib This list may not describe all possible interactions. Give your health care provider a list of all the medicines, herbs, non-prescription drugs, or dietary supplements you use. Also tell them if you smoke, drink alcohol, or use illegal drugs. Some items may interact with your medicine. What should I watch for while using this medicine? Visit your doctor or health care professional for regular checkups. If you are taking this medicine for a long time you may need blood work. Tell your doctor if your symptoms do not improve. Some fungal infections need many weeks or months of treatment to cure. Alcohol can increase possible damage to your liver. Avoid alcoholic drinks. If you have a vaginal infection, do not have sex until you have finished your treatment. You can wear a sanitary napkin. Do not use tampons. Wear freshly washed cotton, not synthetic, panties. What side effects may I notice from receiving this medicine? Side effects that you should report to your doctor or health care professional as soon as possible: -allergic reactions like skin rash or itching, hives, swelling of the lips, mouth, tongue, or throat -dark urine -feeling dizzy or faint -irregular heartbeat or chest pain -redness, blistering, peeling or loosening of the skin, including inside the mouth -trouble breathing -unusual bruising or bleeding -vomiting -yellowing of the eyes or skin Side effects that usually do not require medical attention (report to your doctor or health care professional if they continue or are bothersome): -changes in how food tastes -diarrhea -headache -stomach upset or nausea This list may not describe all possible side effects. Call your doctor for medical advice about side effects. You may report side effects to FDA at 1-800-FDA-1088. Where should I keep my medicine? Keep out of the reach of children. Store at room temperature below 30  degrees C (86 degrees F). Throw away any medicine after the expiration date. NOTE: This sheet is a summary. It may not cover all possible information. If you have questions about this medicine, talk to your doctor, pharmacist, or health care provider.  2015, Elsevier/Gold Standard. (2012-09-27 16:13:04)

## 2014-09-02 NOTE — Progress Notes (Signed)
GYN ONCOLOGY OFFICE VISIT   CC:  Endometrial cancer surveillance, Abnormal pap +hrHPVDNA , vasomotor instability  Assessment/Plan:  [name]  is a 44 y.o.  year old status post laparoscopic hysterectomy bilateral salpingectomy by Dr. Harolyn Rutherford  for abnormal uterine bleeding with 2 prior endometrial sampling that were negative for malignancy. The specimen was morcellated into 4 pieces to facilitate removal from the vagina. 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 Based on the grade and lymphovascular space involvement is recommended that she undergo vaginal cuff brachytherapy.Treatment was completed May 21, 2013. Follow-up with Gyn Onc in 3 months Mammogram  Abnormal pap 12/2013 [facility] HPV+ colpo neg Repeat pap 05/2014 [facility] hrHPV +  Per [facility] colposcopy guidelines there isn't an indication for repeat pap for 12 months after colposcopy.  Repeat Pap  and HPV 01/2015.  If HPV + repeat pap again in 12 months.  Vasomotor instability Clonidine 0.1 mg daily  Vaginal candidiasis Diflucan   HPI: [name]  is a 44 y.o.G2 P2 last normal menstrual period in 11/03/2012. She reports abnormal uterine bleeding that began approximately 6 months ago. It became very heavy in September 2014 when she presented for evaluation and was initially placed on Megace.  12/01/12 ECC - BENIGN ENDOCERVICAL MUCOSA, - DETACHED FRAGMENTS OF SQUAMOUS EPITHELIUM; NEGATIVE FOR INTRAEPITHELIAL LESION OR MALIGANNCY. Endometrium, biopsy - FRAGMENTS OF ENDOMETRIOID-TYPE POLYP(S) WITH DECIDUALIZATION OF THE STROMA, CONSISTENT WITH HORMONE EFFECT. - THERE IS NO EVIDENCE OF HYPERPLASIA OR MALIGNANCY. Repeat biopsy on 01/02/2013 Endometrium, curettage - FRAGMENTS SUGGESTIVE  OF ENDOMETRIOID TYPE POLYP(S) WITH DECIDUALIZATION OF THE STROMA, CONSISTENT WITH HORMONE EFFECT. - BENIGN ENDOCERVICAL TYPE MUCOSA. - THERE IS NO EVIDENCE OF HYPERPLASIA OR MALIGNANCY.  On 01/22/2013 she underwent a total laparoscopic hysterectomy unilateral salpingectomy with "some  vaginal morcellation to deliver the uterus vaginally after the hysterectomy was completed"  Uterus and bilateral fallopian tubes, with cervix ( 4 sections) Specimen: Uterus, cervix, one fallopian tube Procedure: Hysterectomy and salpingectomy Lymph node sampling performed: No Specimen integrity: Fragmented, please see gross description for details. Maximum tumor size: Estimated size 6.5 cm, grossly Histologic type: Invasive endometrioid carcinoma Grade: FIGO Grade II Myometrial invasion: 0.9 cm where myometrium is 2.4 cm in thickness Cervical stromal involvement: No Lymph - vascular invasion: Present, extensively FIGO Stage (based on pathologic findings, needs clinical correlation): At least IA Comment:  Sections show an invasive endometrioid carcinoma involving the upper portion of the uterus, estimated 6.5 cm in greatest dimension. There is extensive angiolymphatic invasion present. IHC Expression Result: MLH1: Preserved nuclear expression (greater 50% tumor expression) MSH2: Preserved nuclear expression (greater 50% tumor expression) MSH6: Preserved nuclear expression (greater 50% tumor expression) PMS2: Preserved nuclear expression (greater 50% tumor expression) * Internal control demonstrates intact nuclear expression  Discussion with Dr. Harolyn Rutherford confirmed that there was vaginal not intra-abdominal morcellation and that both fallopian tubes were removed, although only one was in the pathology specimen.  CT 12/17/2015to assess for nodal or intraperioneal disease notable for no retroperitoneal or retrocrural adenopathy. Normal colon, appendix, and terminal ileum. Normal small bowel without abdominal  ascites. No evidence of omental  or peritoneal disease. No pelvic adenopathy. Residual normal appearing left ovary on image 72 and right ovary on image 76. No adnexal mass. There is mild pelvic fascia thickening and trace fluid which is  presumably postoperative.   On 03/31/13 she underwent: Robotic bilateral oophorectomy, left salpingectomy, bilateral pelvic lymph node dissection, right PA node sampling   Pathology: Bilateral ovaries, left fallopian tube, Right para-aortic lymph nodes, bilateral pelvic lymph nodes to pathology.   Operative findings: Status post hysterectomy with mild adhesive disease of the rectosigmoid colon to the left adnexa. Significant intra-abdominal obesity. Prominent bilateral pelvic lymph nodes.  Pathology revealed: Diagnosis 1. Ovary and fallopian tube, left - BENIGN OVARY WITH CYSTIC FOLLICLES. - SURFACE FOREIGN BODY GIANT CELL REACTION. - THERE IS NO EVIDENCE OF MALIGNANCY. - SEE COMMENT. 2. Ovary and fallopian tube, right - BENIGN OVARY WITH CYSTIC FOLLICLES. - THERE IS NO EVIDENCE OF MALIGNANCY. - SEE COMMENT. 3. Lymph nodes, regional resection, right pelvic - THERE IS NO EVIDENCE OF CARCINOMA IN 10 OF 10 LYMPH NODES (0/10). 4. Lymph nodes, regional resection, left pelvic - THERE IS NO EVIDENCE OF CARCINOMA IN 7 OF 7 LYMPH NODES (0/7). 5. Lymph nodes, regional resection, right para-aortic - THERE IS NO EVIDENCE OF CARCINOMA IN 1 OF 1 LYMPH NODE (0/1).  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   Given extensive LVSI adjuvant bracytherapy was recommended. Radiation treatment dates: February 19th, February 25, March 5, March 12, May 21, 2013  Site/dose: Proximal vagina 30 Gy in 5 fractions  Beams/energy: Patient was treated with intracavitary brachytherapy treatments alone using iridium 192 as the high-dose-rate source. The patient was treated with a 3 cm diameter cylinder. Prescription was  to the mucosal surface. Treatment length was 4 cm  Pap 12/27/2013 [facility] +hrHPV DNA Colposcopy 01/2014 Vagina, biopsy - BENIGN SQUAMOUS MUCOSA. - NO DYSPLASIA OR EVIDENCE OF MALIGNANCY.  Repeat pap 05/2014 [facility] HPV+ Reports interval psychiatric hospitalization. Mental heath is managed by Domenica Fail PA.   Has remained a non smoker.  Social Hx:   History   Social History  . Marital Status: Divorced    Spouse Name: N/A  . Number of Children: N/A  . Years of Education: N/A   Occupational History  . Not on file.   Social History Main Topics  . Smoking status: Former Smoker -- 0.50 packs/day for 26 years    Types: Cigarettes    Quit date: 01/01/2014  . Smokeless tobacco: Never Used  . Alcohol Use: 0.0 oz/week    0 Standard drinks or equivalent per week  . Drug Use: No  . Sexual Activity: Not Currently    Birth Control/ Protection: Surgical   Other Topics Concern  . Not on file   Social History Narrative  Stopped smoking 1 month ago.  Is on a nicotine patch.  Exercises daily and is on Weight Watchers.  Has lost 28 pounds.  Past Surgical Hx:  Past Surgical History  Procedure Laterality Date  . Cesarean section      X2  . Tubal ligation    . Dilitation & currettage/hystroscopy with hydrothermal ablation N/A 01/01/2013    Procedure: DILATATION & CURETTAGE/HYSTEROSCOPY WITH ATTEMPTED HYDROTHERMAL ABLATION: Change over to [place] @ 1505;  Surgeon: Osborne Oman, MD;  Location: [facility];  Service: Gynecology;  Laterality: N/A;  . Laparoscopic assisted vaginal hysterectomy N/A 01/22/2013    Procedure: LAPAROSCOPIC ASSISTED VAGINAL HYSTERECTOMY with Bilateral Fallopian Tubes and Ovaries;  Surgeon: Osborne Oman, MD;  Location: [facility];  Service: Gynecology;  Laterality: N/A;  . Robotic assisted total hysterectomy with bilateral salpingo oopherectomy Bilateral 03/31/2013    Procedure: ROBOTIC ASSISTED  BILATERAL OOPHORECTOMY PELVIC AND POSSIBLE PARA-AORTIC LYMPH NODE  DISSECTION;  Surgeon: Imagene Gurney A. Alycia Rossetti, MD;  Location: [facility];  Service: Gynecology;  Laterality: Bilateral;    Past Medical Hx:  Past Medical History  Diagnosis Date  . Anemia   . Menorrhagia   . Depression   . Anxiety   . Headache(784.0)     MIGRAINES  . Cancer     ENDOMETRIAL CANCER  . Complication of anesthesia     PT STATES SHE WOKE UP AFTER D&C [place].  STATES NO PROBLEMS WAKING UP AFTER HER HYSTERECTOMY  . History of radiation therapy 04/23/13, 04/29/13, 05/07/13, 05/14/13, 05/21/13    30 Gy to proximal vagina    Past Gynecological History: Gravida 2 per 2 menarche at age 36 with regular menses until 6 months ago. Reports oral contraceptive pill use for 14 years in the remote past Patient's last menstrual period was 11/03/2012. Denies h/o abnormal pap prior to hysterectomy.  Family Hx:  Family History  Problem Relation Age of Onset  . Diabetes Mother   . Hypertension Mother   . Cancer Neg Hx    ROS:  No cough no abdominal pain, no nausea or vomiting.  28 pound weight loss on Wellbutrin and weight watchers.  Neuropathic left leg pain managed with ibuprofen. Reports vasomotor symptoms not well managed with effexor.  Reports white vaginal discharge since administration of abx.  Vitals:  Blood pressure 139/75, pulse 72, temperature 98.2 F (36.8 C), temperature source Oral, resp. rate 20, height '5\' 6"'  (1.676 m), weight 280 lb (127.007 kg), last menstrual period 11/03/2012, SpO2 98 %. Wt Readings from Last 3 Encounters:  09/02/14 280 lb (127.007 kg)  05/06/14 251 lb 8 oz (114.08 kg)  04/24/14 248 lb (112.492 kg)  Body mass index is 45.21 kg/(m^2).  Physical Exam: WD in NAD  Chest:  CTA Cardiac:  RRR Psychiatry: Alert and oriented to person, place, and time  Abdomen : [facility] surgical sites intact without evidence of hernia or masses.  LN:  No cervical supraclavicular or inguinal adenopathy Back:  No CVAT Pelvic:  Nl EGBUS no masses bleeding or  discharge.  Atropic vagina with changes c/w vaginal candidiasis.  No masses Rectal:  Good tone no masses EXT:  No CCE Psychiatry:  Mildy pressured speech, very talkative,

## 2014-09-07 ENCOUNTER — Telehealth: Payer: Self-pay | Admitting: Nurse Practitioner

## 2014-09-07 NOTE — Telephone Encounter (Signed)
RN called patient to inform her we are working to schedule her mammogram through the [facility] program. Unfortunately we have met barriers as various people are out on vacation/holiday. Informed her she will hear from [facility] or Dr. Leone Brand office with schedule time/date. She verbalizes understanding and thanks for the call.

## 2014-09-15 ENCOUNTER — Ambulatory Visit: Payer: No Typology Code available for payment source | Admitting: Radiation Oncology

## 2014-09-15 ENCOUNTER — Telehealth: Payer: Self-pay | Admitting: Oncology

## 2014-09-15 ENCOUNTER — Ambulatory Visit
Admission: RE | Admit: 2014-09-15 | Discharge: 2014-09-15 | Disposition: A | Discharge status: Home / Self Care | Payer: Self-pay | Source: Ambulatory Visit | Attending: Radiation Oncology | Admitting: Radiation Oncology

## 2014-09-15 NOTE — Telephone Encounter (Signed)
Sheryl Mathis regarding her f/u appointment with Dr. Sondra Come today.  Per Judeen Hammans, the appointment was supposed to be canceled.  She is following up with Dr. Skeet Latch now.

## 2014-09-22 ENCOUNTER — Telehealth: Payer: Self-pay | Admitting: Nurse Practitioner

## 2014-09-22 NOTE — Telephone Encounter (Signed)
Left message with Gabriel Cirri to f/u on scheduling patient's mammogram through [facility]; requested call back.

## 2014-09-26 IMAGING — US US TRANSVAGINAL NON-OB
1 series · 14 of 25 positions shown · non-contrast
Comparison: 04/28/2008

CLINICAL DATA: Vaginal bleeding.



[Series 1: us transvaginal non-ob · 0.35mm/px · 14 of 72 slices shown]
[im 1/72]
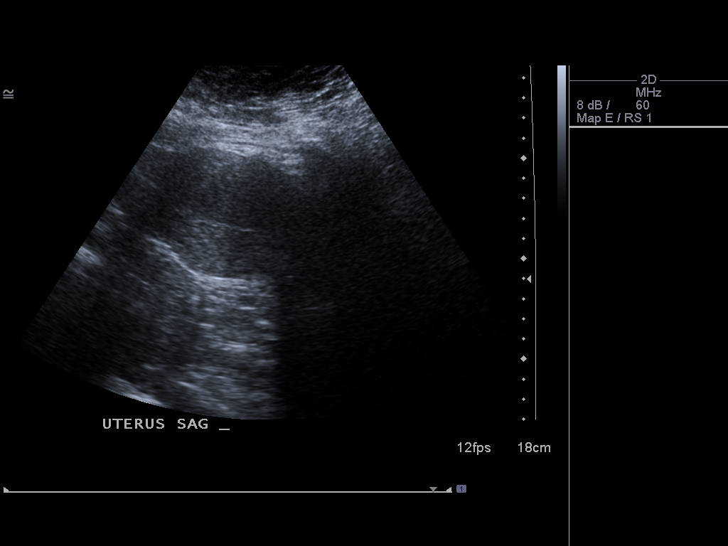
[im 6/72]
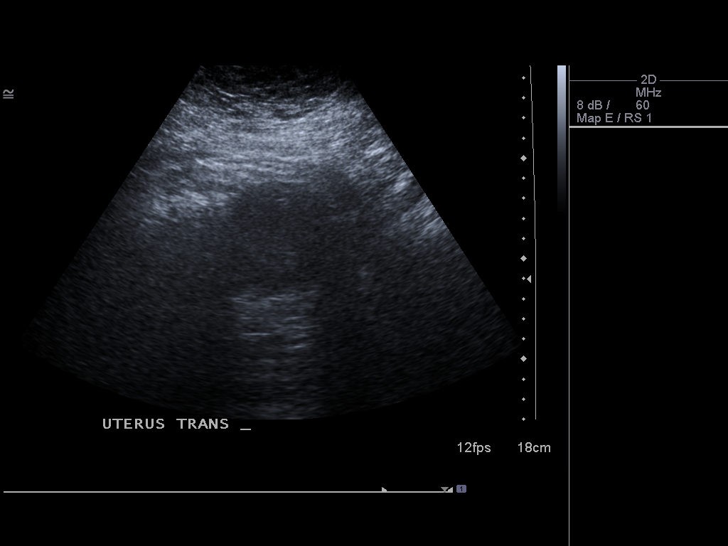
[im 12/72]
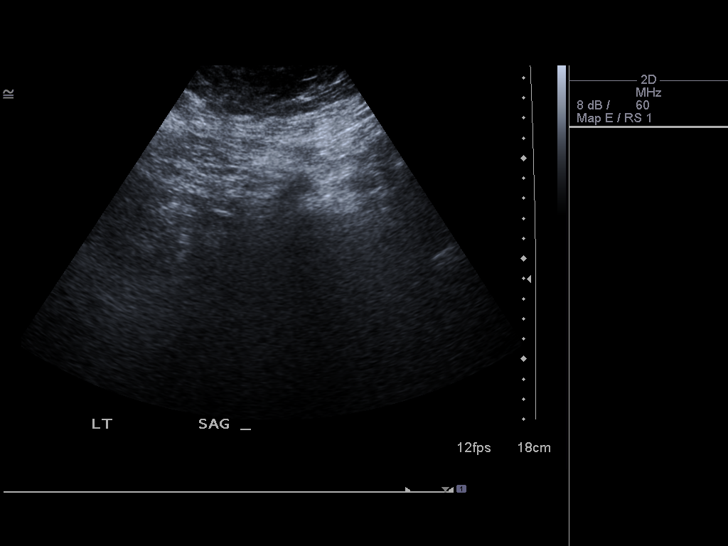
[im 18/72]
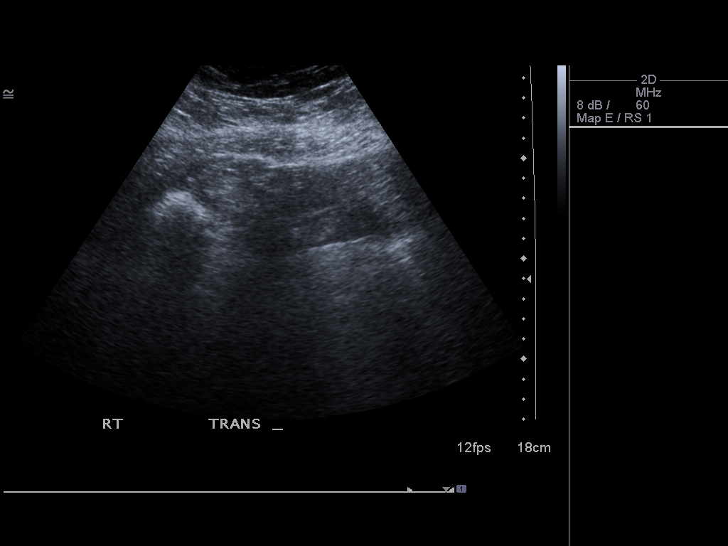
[im 24/72]
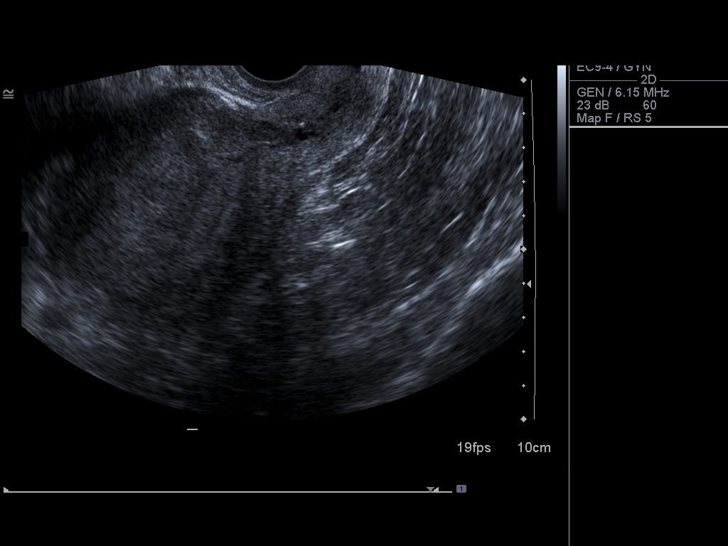
[im 27/72]
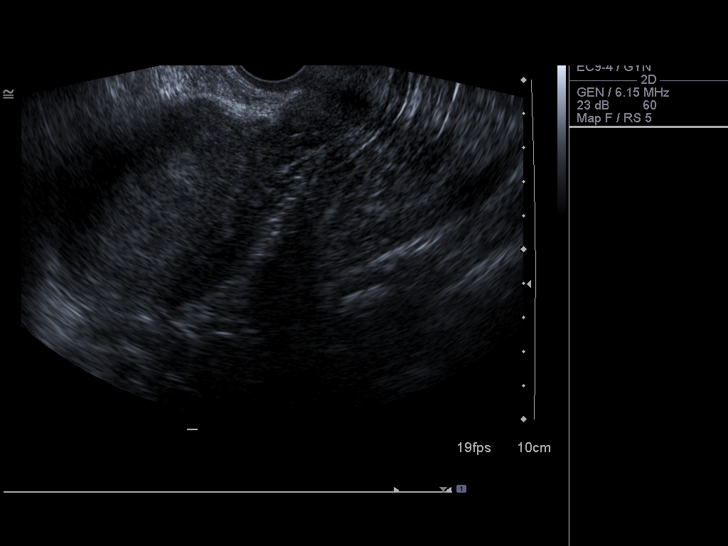
[im 33/72]
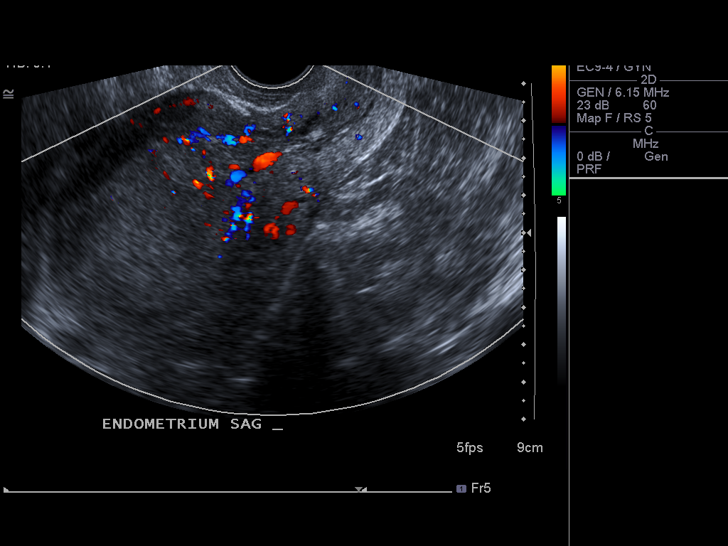
[im 39/72]
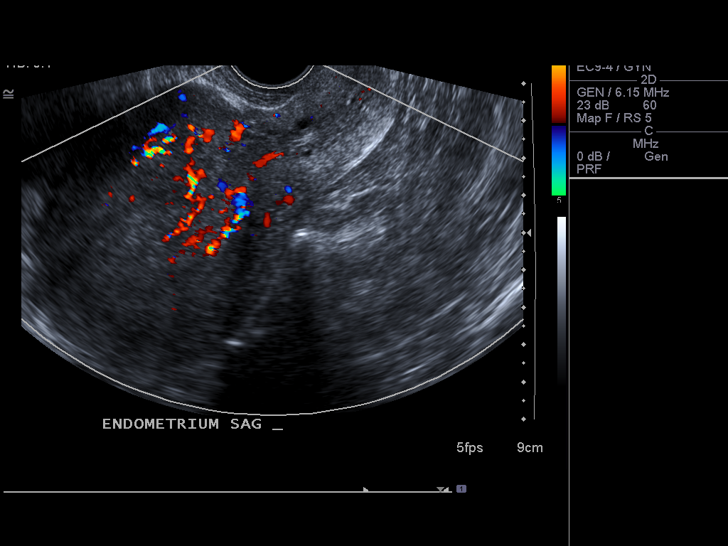
[im 45/72]
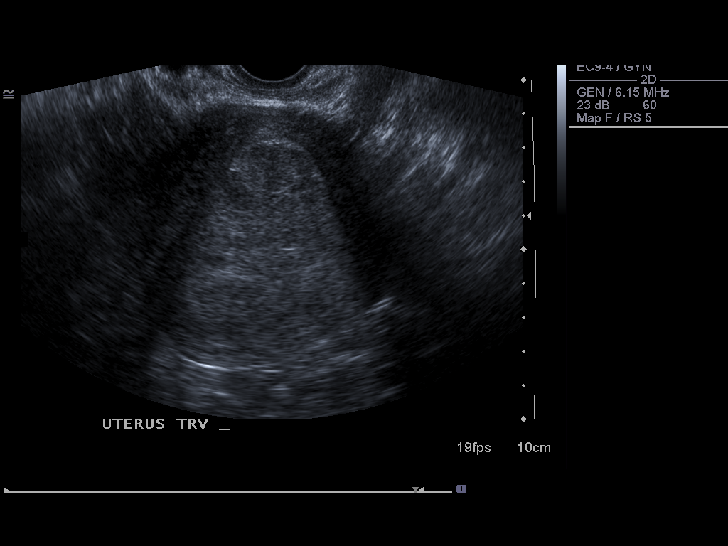
[im 48/72]
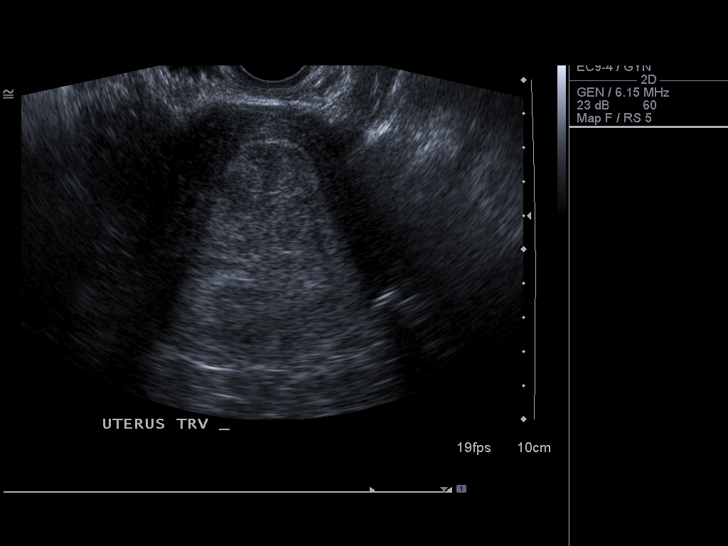
[im 54/72]
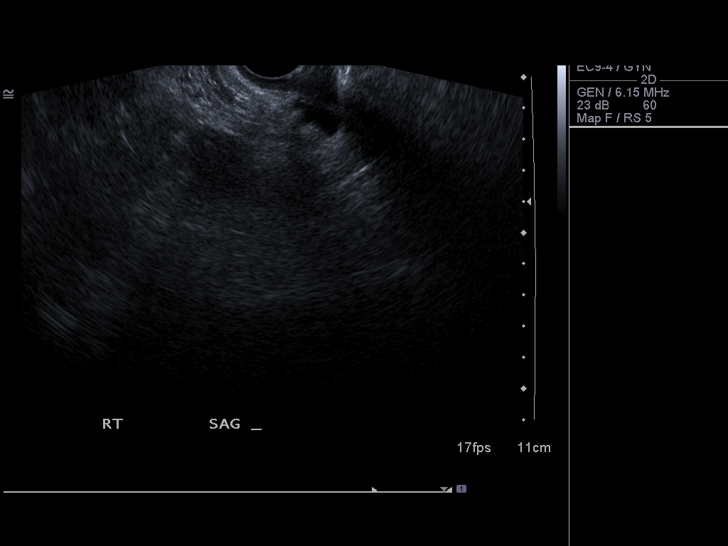
[im 60/72]
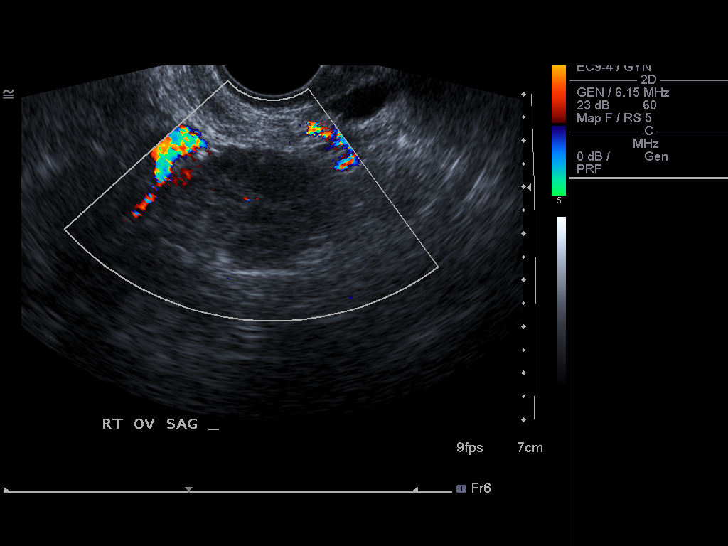
[im 66/72]
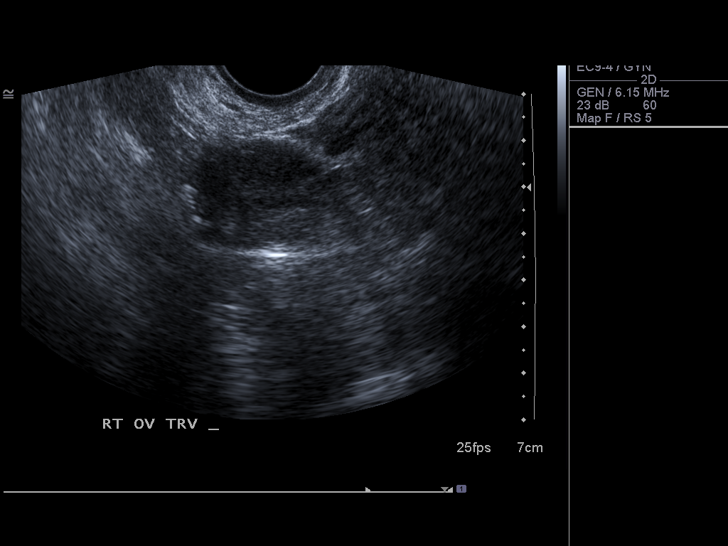
[im 72/72]
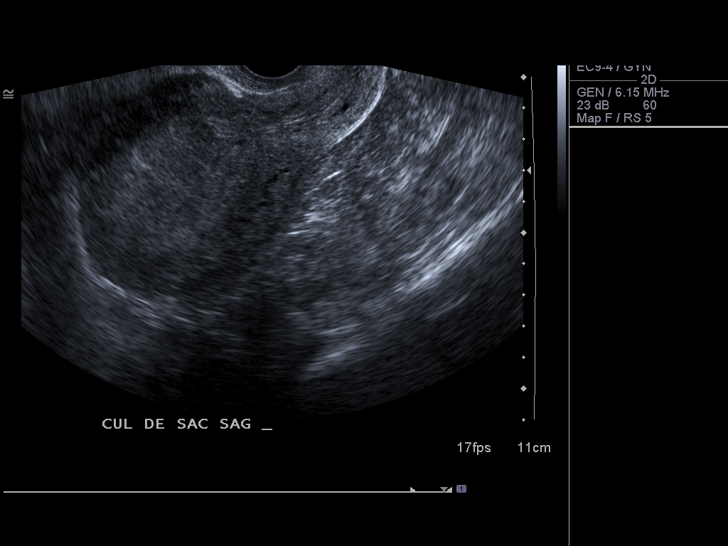

[14 of 25 positions shown; findings below may reference images not displayed]

FINDINGS: Uterus

Measurements: 10.4 x 6.1 x 7.1 cm. Mildly prominent. No focal
abnormality. Normal echotexture.

Endometrium

Thickness: Markedly thickened, 30 mm mildly heterogeneous
echotexture.

Right ovary

Measurements: 3.3 x 2.5 x 2.9 cm. Normal appearance/no adnexal mass.

Left ovary

Measurements: Unable to move visualized due to overlying bowel gas.
No adnexal mass seen.

Other findings

No free fluid.
IMPRESSION: Markedly thickened endometrium at 3 mm. Consider further evaluation
with endometrial biopsy.

## 2014-09-30 ENCOUNTER — Telehealth: Payer: Self-pay | Admitting: *Deleted

## 2014-09-30 ENCOUNTER — Other Ambulatory Visit: Payer: Self-pay | Admitting: *Deleted

## 2014-09-30 ENCOUNTER — Other Ambulatory Visit (HOSPITAL_BASED_OUTPATIENT_CLINIC_OR_DEPARTMENT_OTHER): Payer: Self-pay

## 2014-09-30 DIAGNOSIS — R300 Dysuria: Secondary | ICD-10-CM

## 2014-09-30 LAB — URINALYSIS, MICROSCOPIC - CHCC
BILIRUBIN (URINE): NEGATIVE
Blood: NEGATIVE
Glucose: NEGATIVE mg/dL
Ketones: NEGATIVE mg/dL
LEUKOCYTE ESTERASE: NEGATIVE
Nitrite: NEGATIVE
Protein: NEGATIVE mg/dL
Specific Gravity, Urine: 1.015 (ref 1.003–1.035)
Urobilinogen, UR: 0.2 mg/dL (ref 0.2–1)
pH: 6.5 (ref 4.6–8.0)

## 2014-09-30 NOTE — Telephone Encounter (Signed)
Received call from patient stating that she has had burning with urination the past few days and that her urine has a odor as well. Patient denies a fever or any other symptoms but is inquiring about a prescription for an antibiotic. Told patient that it would be best for her to come to [facility] to have UA/culture done. Pt agreeable to this.  Per [name], NP, patient notified that urinalysis did not show any infection but we will wait on the culture results and let her know if it comes back showing an infection. Encouraged patient to push PO fluids and to call our office if symtpoms do not resolve. While on the phone, patient states she has still not heard anything about getting her mammogram scheduled through the [facility] program. Gabriel Cirri is currently on vacation - sent her an email to f/u with patient once she returns on 10-05-14.

## 2014-10-01 LAB — URINE CULTURE

## 2014-10-04 ENCOUNTER — Telehealth: Payer: Self-pay | Admitting: *Deleted

## 2014-10-04 NOTE — Telephone Encounter (Signed)
Per [name], NP patient notified of urine culture results and that she could try Azo OTC tablets or cranberry juice if the burning persists. Patient states her urinary burning has resolved and that she started drinking more water since Thursday. Patient denies any additional questions or concerns and is agreeable to call prior to the next office visit if any problems do arise.

## 2014-10-29 ENCOUNTER — Telehealth: Payer: Self-pay | Admitting: Gynecologic Oncology

## 2014-10-29 NOTE — Telephone Encounter (Signed)
Patient called stating she has been more aggravated with increased anxiety at times.  She has been seeing a PCP and has been switched to a different MD.  She is now taking clonidine, effexor, cymbalta, and recently added topamax for migraines.  She states she now has tingling in the fingers of her left hand that was present before incorporating the topamax.  She also states her gums are hurting and she has 4 to 5 bad teeth.  Requesting amoxicillin for her teeth since she cannot get into a dentist.  Advised to follow up with her PCP about the tingling and dental issues.  She also states she has not heard from the [facility] program about her mammogram.  Advised that a message had been left for Sabrina with the [facility] program and she should be expecting a phone call.  Advised to call for any further questions.  No other concerns voiced.

## 2014-11-19 ENCOUNTER — Encounter (HOSPITAL_COMMUNITY): Payer: Self-pay | Admitting: Emergency Medicine

## 2014-11-19 ENCOUNTER — Emergency Department (HOSPITAL_COMMUNITY)
Admission: EM | Admit: 2014-11-19 | Discharge: 2014-11-19 | Disposition: A | Discharge status: Home / Self Care | Payer: Self-pay | Attending: Emergency Medicine | Admitting: Emergency Medicine

## 2014-11-19 DIAGNOSIS — Z87891 Personal history of nicotine dependence: Secondary | ICD-10-CM | POA: Insufficient documentation

## 2014-11-19 DIAGNOSIS — G43909 Migraine, unspecified, not intractable, without status migrainosus: Secondary | ICD-10-CM | POA: Insufficient documentation

## 2014-11-19 DIAGNOSIS — Z8742 Personal history of other diseases of the female genital tract: Secondary | ICD-10-CM | POA: Insufficient documentation

## 2014-11-19 DIAGNOSIS — K0889 Other specified disorders of teeth and supporting structures: Secondary | ICD-10-CM

## 2014-11-19 DIAGNOSIS — Z8542 Personal history of malignant neoplasm of other parts of uterus: Secondary | ICD-10-CM | POA: Insufficient documentation

## 2014-11-19 DIAGNOSIS — F329 Major depressive disorder, single episode, unspecified: Secondary | ICD-10-CM | POA: Insufficient documentation

## 2014-11-19 DIAGNOSIS — K029 Dental caries, unspecified: Secondary | ICD-10-CM | POA: Insufficient documentation

## 2014-11-19 DIAGNOSIS — Z792 Long term (current) use of antibiotics: Secondary | ICD-10-CM | POA: Insufficient documentation

## 2014-11-19 DIAGNOSIS — Z79899 Other long term (current) drug therapy: Secondary | ICD-10-CM | POA: Insufficient documentation

## 2014-11-19 DIAGNOSIS — Z862 Personal history of diseases of the blood and blood-forming organs and certain disorders involving the immune mechanism: Secondary | ICD-10-CM | POA: Insufficient documentation

## 2014-11-19 DIAGNOSIS — F419 Anxiety disorder, unspecified: Secondary | ICD-10-CM | POA: Insufficient documentation

## 2014-11-19 HISTORY — DX: Migraine, unspecified, not intractable, without status migrainosus: G43.909

## 2014-11-19 HISTORY — DX: Polyneuropathy, unspecified: G62.9

## 2014-11-19 MED ORDER — NAPROXEN 250 MG PO TABS: 250.0000 mg | ORAL_TABLET | Freq: Two times a day (BID) | ORAL | Status: DC | PRN

## 2014-11-19 MED ORDER — PENICILLIN V POTASSIUM 250 MG PO TABS: 250.0000 mg | ORAL_TABLET | Freq: Four times a day (QID) | ORAL | Status: DC

## 2014-11-19 NOTE — Discharge Instructions (Signed)
°Emergency Department Resource Guide °1) Find a Doctor and Pay Out of Pocket °Although you won't have to find out who is covered by your insurance plan, it is a good idea to ask around and get recommendations. You will then need to call the office and see if the doctor you have chosen will accept you as a new patient and what types of options they offer for patients who are self-pay. Some doctors offer discounts or will set up payment plans for their patients who do not have insurance, but you will need to ask so you aren't surprised when you get to your appointment. ° °2) Contact Your Local Health Department °Not all health departments have doctors that can see patients for sick visits, but many do, so it is worth a call to see if yours does. If you don't know where your local health department is, you can check in your phone book. The CDC also has a tool to help you locate your state's health department, and many state websites also have listings of all of their local health departments. ° °3) Find a Walk-in Clinic °If your illness is not likely to be very severe or complicated, you may want to try a walk in clinic. These are popping up all over the country in pharmacies, drugstores, and shopping centers. They're usually staffed by nurse practitioners or physician assistants that have been trained to treat common illnesses and complaints. They're usually fairly quick and inexpensive. However, if you have serious medical issues or chronic medical problems, these are probably not your best option. ° °No Primary Care Doctor: °- Call [facility] at  832-8000 - they can help you locate a primary care doctor that  accepts your insurance, provides certain services, etc. °- Physician Referral Service- 1-800-533-3463 ° °Chronic Pain Problems: °Organization         Address  Phone   Notes  °[facility]  (336) 297-2271 Patients need to be referred by their primary care doctor.  ° °Medication  Assistance: °Organization         Address  Phone   Notes  °[place] [facility] [address] °[place], [place] [address] (336) 641-8030 --Must be a resident of [place] °-- Must have NO insurance coverage whatsoever (no Medicaid/ Medicare, etc.) °-- The pt. MUST have a primary care doctor that directs their care regularly and follows them in the community °  °[facility]  (866) 331-1348   °[employer]  (888) 892-1162   ° °Agencies that provide inexpensive medical care: °Organization         Address  Phone   Notes  °[facility]  (336) 832-8035   °[facility]    (336) 832-7272   °[facility] [address] °[place], [place] [address] (336) 832-4777   °[facility] [address], °[place] (336) 271-4999   °[facility]    (336) 373-0678   °[facility]    (336) 272-1050   °[facility] ° [address], [place] Phone:  (336) 832-4444, Fax:  (336) 832-4440 Hours of Operation:  9 am - 6 pm, M-F.  Also accepts Medicaid/Medicare and self-pay.  °[facility] ° [address], [place] Phone: (336) 832-3150, Fax: (336) 832-3151. Hours of Operation:  8:30 am - 5:30 pm, M-F.  Also accepts Medicaid and self-pay.  °[facility] [place] [address], [place] Phone: (336) 878-6027   °[facility] [address], [place], [place] (336)723-1848, Ext. 123 Mondays & Thursdays: 7-9 AM.  First 15 patients are seen on a first come, first serve basis. °  ° °Medicaid-accepting [place] Providers: ° °Organization         Address  Phone   Notes  °[facility] [address], [place] (336) 641-2100 Also accepts self-pay patients.  °[facility] [address], [place] ° (336) 856-9996   °[facility] [address], [place]  (336) 288-8857   °[facility] [address] [place] [address], [place] (336) 299-7000   °Veita Bland [address], [place]  ° (336) 373-1557 Only accepts [place] Access Medicaid patients after they have their name applied to their card.  ° °Self-Pay (no insurance) in [place]: ° °Organization         Address  Phone   Notes  °Sickle Cell Patients, [facility] [address], [place] (336) 832-1970   °[facility] [address], [place] (336) 832-4400   °[facility] ° [address], [place] (336) 992-4800   °[facility]/Dr. Osei-Bonsu ° [address] [place] [address], [place] or [address], [place] (336) 841-8500 Phone number for both [place] and [place] locations is the same.  °[facility] [address], [place] (336) 299-0000   °[facility] [address] [place] [address], [place] or [address] (336) 852-7530 °(336) 878-2260   °[facility] [address], [place] (336) 350-1642, phone; (336) 294-5005, fax Sees patients 1st and 3rd Saturday of every month.  Must not qualify for public or private insurance (i.e. Medicaid, Medicare, [place] Health Choice, Veterans' Benefits) • Household income should be no more than 200% of the poverty level •The clinic cannot treat you if you are pregnant or think you are pregnant • Sexually transmitted diseases are not treated at the clinic.  ° ° °Dental Care: °Organization         Address  Phone  Notes  °[place] [facility] [address], [place] (336) 641-6152 Accepts children up to age 21 who are enrolled in Medicaid or [place] Health Choice; pregnant women with a Medicaid card; and children who have applied for Medicaid or [place] Health Choice, but were declined, whose parents can pay a reduced fee at time of service.  °[place]  [facility] [place]  [address], [place] (336) 641-7733 Accepts children up to age 21 who are enrolled in Medicaid or [place] Health Choice; pregnant women with a Medicaid card; and children who have applied for Medicaid or [place] Health Choice, but were declined, whose parents can pay a reduced fee at time of service.  °[facility] ° [address], [place] (336) 641-4533 Patients are seen by appointment only. Walk-ins are not accepted. [facility] will see patients 18 years of age and older. °Monday - Tuesday (8am-5pm) °Most Wednesdays (8:30-5pm) °$30 per visit, cash only  °[facility] ° [address], [place] (336) 641-4533 Patients are seen by appointment only. Walk-ins are not accepted. [facility] will see patients 18 years of age and older. °One   Wednesday Evening (Monthly: Volunteer Based).  $30 per visit, cash only  °[facility]  (919) 537-3737 for adults; Children under age 4, call Graduate Pediatric Dentistry at (919) [phone]. Children aged 4-14, please call (919) [phone] to request a pediatric application. ° Dental services are provided in all areas of dental care including fillings, crowns and bridges, complete and partial dentures, implants, gum treatment, root canals, and extractions. Preventive care is also provided. Treatment is provided to both adults and children. °Patients are selected via a lottery and there is often a waiting list. °  °[facility] [address], °[place] ° (336) 763-8833 www.drcivils.com °  °[facility] [address], [place], [place] (336)723-1848, Ext. 123 Second and Fourth Thursday of each month, opens at 6:30 AM; Clinic ends at 9 AM.  Patients are seen on a first-come first-served basis, and a limited number are seen during each clinic.  ° °[facility] ° [address], [place], [place] (336) 723-7904    Eligibility Requirements °You must have lived in [place], or [place] counties for at least the last three months. °  You cannot be eligible for state or federal sponsored healthcare insurance, including Veterans Administration, Medicaid, or Medicare. °  You generally cannot be eligible for healthcare insurance through your employer.  °  How to apply: °Eligibility screenings are held every Tuesday and Wednesday afternoon from 1:00 pm until 4:00 pm. You do not need an appointment for the interview!  °[facility] [address], [place], [place] 336-631-2330   °[facility]  336-342-8273   °[facility]  336-703-3100   °[facility]  336-570-6415   ° °Behavioral Health Resources in the Community: °Intensive Outpatient Programs °Organization         Address  Phone  Notes  °[facility] [address], [place], [place] 336-878-6098   °[facility] [address], [place], [place] 336-832-9800   °[facility] [address], [place], [place] ° 336-882-2125   °[facility] [address],  °[place], [place] 1-800-853-5163 or 336-641-4981   °Substance Abuse Resources °Organization         Address  Phone  Notes  °[facility]  336-882-2125   °[facility]  336-784-9470   °[facility]  336-285-9073   °[facility]  336-845-3988   °Residential & Outpatient Substance Abuse Program  1-800-659-3381   °Psychological Services °Organization         Address  Phone  Notes  °[facility]  336- 832-9600   °[facility]  336- 378-7881   °[facility] [address], [place] 1-800-853-5163 or 336-641-4981   ° °Mobile Crisis Teams °Organization         Address  Phone  Notes  °[facility], Mobile Crisis Care Unit  1-877-626-1772   °Assertive °[facility] ° [address]  [place], [place] 336-834-9664   °Sharon DeEsch [address] °[place] 336-554-5454   ° °Self-Help/Support Groups °Organization         Address  Phone             Notes  °[facility] [place] - variety of support groups  336- 373-1402 Call for more information  °Narcotics Anonymous (NA), [facility] [address], °[place]   2 meetings at this location  ° °  Residential Treatment Programs Organization         Address  Phone  Notes  [facility] [address]    [place]  1-306-078-4443   [facility]  [address] [place] [address], [place], [place]   [place], [place] 954-300-5257 Admissions: 8am-3pm M-F  [facility] [address],    [place], [place] 762-831-5176   [facility] [address] [place], [place], [place]   [facility] [address]  [place], [place]   [facility] Intensive Outpatient [address], [place], [place]   [facility] ([place]  [place], [place] 1-2253026844 or 705-874-9446   [facility] ([facility] [address] [place], [place] Accepts Medicaid  [facility] [address]  [place] 1-2287349393 Substance Abuse/Addiction Treatment   [facility]         Address  Phone  Notes  [facility]  972-542-5009   Domenic Schwab, PhD [address] [place], [place]   210 863 2649 or (478)637-1708   [facility], [place] (717)721-1313   [facility] [address] [place], [place] 9292231215 Insurance/Medicaid/sponsorship through [facility] [address] [place]                                    [place], [place] (430) 024-8626 [place] [address], [place] [phone]    Dr. Adele Schilder  (832)834-0977   [facility] Dept. 1) [address] [place] 2) [place] 3)  [address], [place] [phone] [phone])[phone]  803-127-3749   [place] [phone] or 4247511295 (After Hours)      Take the prescriptions as directed. Also take over the counter tylenol, as directed on packaging, as needed for discomfort. Call the dentist today to schedule a follow up appointment within the next week.  Return to the Emergency Department immediately sooner if worsening.

## 2014-11-19 NOTE — ED Provider Notes (Signed)
CSN: [id number]     Arrival date & time 11/19/14  0708 History   First MD Initiated Contact with Patient 11/19/14 [phone]     Chief Complaint  Patient presents with  . Dental Pain      HPI Pt was seen at 0750. Per pt, c/o gradual onset and persistence of constant right upper and lower lower teeth "pain" for the past 8 days. Denies fevers, no intra-oral edema, no rash, no facial swelling, no dysphagia, no neck pain.   The condition is aggravated by nothing. The condition is relieved by nothing. The symptoms have been associated with no other complaints.    Past Medical History  Diagnosis Date  . Anemia   . Menorrhagia   . Depression   . Anxiety   . Headache(784.0)     MIGRAINES  . Complication of anesthesia     PT STATES SHE WOKE UP AFTER D&C [place].  STATES NO PROBLEMS WAKING UP AFTER HER HYSTERECTOMY  . History of radiation therapy 04/23/13, 04/29/13, 05/07/13, 05/14/13, 05/21/13    30 Gy to proximal vagina  . Migraines   . Neuropathy   . Cancer     ENDOMETRIAL CANCER (tx completed 05/2013)   Past Surgical History  Procedure Laterality Date  . Cesarean section      X2  . Tubal ligation    . Dilitation & currettage/hystroscopy with hydrothermal ablation N/A 01/01/2013    Procedure: DILATATION & CURETTAGE/HYSTEROSCOPY WITH ATTEMPTED HYDROTHERMAL ABLATION: Change over to [place] @ 1505;  Surgeon: Osborne Oman, MD;  Location: [facility];  Service: Gynecology;  Laterality: N/A;  . Laparoscopic assisted vaginal hysterectomy N/A 01/22/2013    Procedure: LAPAROSCOPIC ASSISTED VAGINAL HYSTERECTOMY with Bilateral Fallopian Tubes and Ovaries;  Surgeon: Osborne Oman, MD;  Location: [facility];  Service: Gynecology;  Laterality: N/A;  . Robotic assisted total hysterectomy with bilateral salpingo oopherectomy Bilateral 03/31/2013    Procedure: ROBOTIC ASSISTED BILATERAL OOPHORECTOMY PELVIC AND POSSIBLE PARA-AORTIC LYMPH NODE DISSECTION;  Surgeon: Imagene Gurney A.  Alycia Rossetti, MD;  Location: [facility];  Service: Gynecology;  Laterality: Bilateral;   Family History  Problem Relation Age of Onset  . Diabetes Mother   . Hypertension Mother   . Cancer Neg Hx    Social History  Substance Use Topics  . Smoking status: Former Smoker -- 0.50 packs/day for 26 years    Types: Cigarettes    Quit date: 08/03/2013  . Smokeless tobacco: Never Used  . Alcohol Use: No   OB History    Gravida Para Term Preterm AB TAB SAB Ectopic Multiple Living   '2 2 2       2     '$ Review of Systems ROS: Statement: All systems negative except as marked or noted in the HPI; Constitutional: Negative for fever and chills. ; ; Eyes: Negative for eye pain and discharge. ; ; ENMT: Positive for dental caries, dental hygiene poor and toothache. Negative for ear pain, bleeding gums, dental injury, facial deformity, facial swelling, hoarseness, nasal congestion, sinus pressure, sore throat, throat swelling and tongue swollen. ; ; Cardiovascular: Negative for chest pain, palpitations, diaphoresis, dyspnea and peripheral edema. ; ; Respiratory: Negative for cough, wheezing and stridor. ; ; Gastrointestinal: Negative for nausea, vomiting, diarrhea and abdominal pain. ; ; Genitourinary: Negative for dysuria, flank pain and hematuria. ; ; Musculoskeletal: Negative for back pain and neck pain. ; ; Skin: Negative for rash and skin lesion. ; ; Neuro: Negative for headache, lightheadedness and  neck stiffness. ;     Allergies  Review of patient's allergies indicates no known allergies.  Home Medications   Prior to Admission medications   Medication Sig Start Date End Date Taking? Authorizing Provider  clindamycin (CLINDAGEL) 1 % gel Apply 1 application topically 2 (two) times daily. Rash under arms   Yes Historical Provider, MD  cloNIDine (CATAPRES) 0.1 MG tablet Take 1 tablet (0.1 mg total) by mouth daily. 09/02/14  Yes Melissa D Cross, NP  DULoxetine (CYMBALTA) 60 MG capsule Take 60 mg by mouth daily.    Yes Historical Provider, MD  ibuprofen (ADVIL,MOTRIN) 800 MG tablet Take 1 tablet (800 mg total) by mouth every 8 (eight) hours as needed for mild pain. 09/02/14  Yes Melissa D Cross, NP  loratadine (CLARITIN) 10 MG tablet Take 10 mg by mouth daily.   Yes Historical Provider, MD  promethazine (PHENERGAN) 25 MG tablet Take 25 mg by mouth 2 (two) times daily as needed for nausea or vomiting.   Yes Historical Provider, MD  topiramate (TOPAMAX) 25 MG tablet Take 25 mg by mouth daily.   Yes Historical Provider, MD  venlafaxine (EFFEXOR) 75 MG tablet Take 75 mg by mouth 2 (two) times daily.   Yes Historical Provider, MD  fluconazole (DIFLUCAN) 150 MG tablet Take 1 tablet (150 mg total) by mouth once. Patient not taking: Reported on 11/19/2014 09/02/14   Dorothyann Gibbs, NP   BP 124/55 mmHg  Pulse 82  Temp(Src) 98 F (36.7 C) (Oral)  Resp 18  SpO2 98%  LMP 11/03/2012 Physical Exam  0755: Physical examination: Vital signs and O2 SAT: Reviewed; Constitutional: Well developed, Well nourished, Well hydrated, In no acute distress; Head and Face: Normocephalic, Atraumatic; Eyes: EOMI, PERRL, No scleral icterus; ENMT: Mouth and pharynx normal, Poor dentition, Widespread dental decay, Left TM normal, Right TM normal, Mucous membranes moist, +upper and lower right 2nd molars with extensive dental decay.  No gingival erythema, edema, fluctuance, or drainage.  No intra-oral edema. No submandibular or sublingual edema. No hoarse voice, no drooling, no stridor. No trismus. ; Neck: Supple, Full range of motion, No lymphadenopathy; Cardiovascular: Regular rate and rhythm, No murmur, rub, or gallop; Respiratory: Breath sounds clear & equal bilaterally, No rales, rhonchi, wheezes, Normal respiratory effort/excursion; Chest: Nontender, Movement normal; Extremities: Pulses normal, No tenderness, No edema; Neuro: AA&Ox3, Major CN grossly intact.  No gross focal motor or sensory deficits in extremities.; Skin: Color normal, No  rash, No petechiae, Warm, Dry    ED Course  Procedures (including critical care time) Labs Review   Imaging Review  I have personally reviewed and evaluated these images and lab results as part of my medical decision-making.   EKG Interpretation None      MDM  MDM Reviewed: previous chart, nursing note and vitals     0800:  Pt encouraged to f/u with dentist or oral surgeon for her dental needs for good continuity of care and definitive treatment.  Verb understanding.    Francine Graven, DO 11/20/14 2036

## 2014-11-19 NOTE — ED Notes (Signed)
Patient c/o dental pain to right side, upper and lower. Patient states she is a cancer patient (Dr Skeet Latch), she called the physician who referred her to the ER. Patient does not have a dentist. Patient has tried various home remedies without relief.

## 2014-11-23 ENCOUNTER — Telehealth (HOSPITAL_COMMUNITY): Payer: Self-pay | Admitting: *Deleted

## 2014-11-23 NOTE — Telephone Encounter (Signed)
Telephoned patient at home # and left message to return call to [facility] 

## 2014-12-15 ENCOUNTER — Telehealth: Payer: Self-pay | Admitting: Gynecologic Oncology

## 2014-12-15 NOTE — Telephone Encounter (Signed)
GYN ONCOLOGY OFFICE VISIT   CC:  Endometrial cancer surveillance, Abnormal pap +hrHPVDNA , vasomotor instability  Assessment/Plan:  [name]  is a 44 y.o.  year old status post laparoscopic hysterectomy bilateral salpingectomy by Dr. Harolyn Rutherford  for abnormal uterine bleeding with 2 prior endometrial sampling that were negative for malignancy. The specimen was morcellated into 4 pieces to facilitate removal from the vagina. 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 Based on the grade and lymphovascular space involvement is recommended that she undergo vaginal cuff brachytherapy.Treatment was completed May 21, 2013. Follow-up with Gyn Onc in 3 months Mammogram  Abnormal pap 12/2013 LSIL HPV+ colpo neg Repeat pap 05/2014 [facility]US hrHPV +  Per ASCCP colposcopy guidelines there isn't an indication for repeat pap for 12 months after colposcopy.  Repeat Pap  and HPV 01/2015.  If HPV + repeat pap again in 12 months.  Vasomotor instability Clonidine 0.1 mg daily  Vaginal candidiasis Diflucan   HPI: [name]  is a 44 y.o.G2 P2 last normal menstrual period in 11/03/2012. She reports abnormal uterine bleeding that began approximately 6 months ago. It became very heavy in September 2014 when she presented for evaluation and was initially placed on Megace.  12/01/12 ECC - BENIGN ENDOCERVICAL MUCOSA, - DETACHED FRAGMENTS OF SQUAMOUS EPITHELIUM; NEGATIVE FOR INTRAEPITHELIAL LESION OR MALIGANNCY. Endometrium, biopsy - FRAGMENTS OF ENDOMETRIOID-TYPE POLYP(S) WITH DECIDUALIZATION OF THE STROMA, CONSISTENT WITH HORMONE EFFECT. - THERE IS NO EVIDENCE OF HYPERPLASIA OR MALIGNANCY. Repeat biopsy on 01/02/2013 Endometrium, curettage - FRAGMENTS SUGGESTIVE  OF ENDOMETRIOID TYPE POLYP(S) WITH DECIDUALIZATION OF THE STROMA, CONSISTENT WITH HORMONE EFFECT. - BENIGN ENDOCERVICAL TYPE MUCOSA. - THERE IS NO EVIDENCE OF HYPERPLASIA OR MALIGNANCY.  On 01/22/2013 she underwent a total laparoscopic hysterectomy unilateral salpingectomy with "some  vaginal morcellation to deliver the uterus vaginally after the hysterectomy was completed"  Uterus and bilateral fallopian tubes, with cervix ( 4 sections) Specimen: Uterus, cervix, one fallopian tube Procedure: Hysterectomy and salpingectomy Lymph node sampling performed: No Specimen integrity: Fragmented, please see gross description for details. Maximum tumor size: Estimated size 6.5 cm, grossly Histologic type: Invasive endometrioid carcinoma Grade: FIGO Grade II Myometrial invasion: 0.9 cm where myometrium is 2.4 cm in thickness Cervical stromal involvement: No Lymph - vascular invasion: Present, extensively FIGO Stage (based on pathologic findings, needs clinical correlation): At least IA Comment:  Sections show an invasive endometrioid carcinoma involving the upper portion of the uterus, estimated 6.5 cm in greatest dimension. There is extensive angiolymphatic invasion present. IHC Expression Result: MLH1: Preserved nuclear expression (greater 50% tumor expression) MSH2: Preserved nuclear expression (greater 50% tumor expression) MSH6: Preserved nuclear expression (greater 50% tumor expression) PMS2: Preserved nuclear expression (greater 50% tumor expression) * Internal control demonstrates intact nuclear expression  Discussion with Dr. Harolyn Rutherford confirmed that there was vaginal not intra-abdominal morcellation and that both fallopian tubes were removed, although only one was in the pathology specimen.  CT 12/17/2015to assess for nodal or intraperioneal disease notable for no retroperitoneal or retrocrural adenopathy. Normal colon, appendix, and terminal ileum. Normal small bowel without abdominal  ascites. No evidence of omental  or peritoneal disease. No pelvic adenopathy. Residual normal appearing left ovary on image 72 and right ovary on image 76. No adnexal mass. There is mild pelvic fascia thickening and trace fluid which is  presumably postoperative.   On 03/31/13 she underwent: Robotic bilateral oophorectomy, left salpingectomy, bilateral pelvic lymph node dissection, right PA node sampling   Pathology: Bilateral ovaries, left fallopian tube, Right para-aortic lymph nodes, bilateral pelvic lymph nodes to pathology.   Operative findings: Status post hysterectomy with mild adhesive disease of the rectosigmoid colon to the left adnexa. Significant intra-abdominal obesity. Prominent bilateral pelvic lymph nodes.  Pathology revealed: Diagnosis 1. Ovary and fallopian tube, left - BENIGN OVARY WITH CYSTIC FOLLICLES. - SURFACE FOREIGN BODY GIANT CELL REACTION. - THERE IS NO EVIDENCE OF MALIGNANCY. - SEE COMMENT. 2. Ovary and fallopian tube, right - BENIGN OVARY WITH CYSTIC FOLLICLES. - THERE IS NO EVIDENCE OF MALIGNANCY. - SEE COMMENT. 3. Lymph nodes, regional resection, right pelvic - THERE IS NO EVIDENCE OF CARCINOMA IN 10 OF 10 LYMPH NODES (0/10). 4. Lymph nodes, regional resection, left pelvic - THERE IS NO EVIDENCE OF CARCINOMA IN 7 OF 7 LYMPH NODES (0/7). 5. Lymph nodes, regional resection, right para-aortic - THERE IS NO EVIDENCE OF CARCINOMA IN 1 OF 1 LYMPH NODE (0/1).  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   Given extensive LVSI adjuvant bracytherapy was recommended. Radiation treatment dates: February 19th, February 25, March 5, March 12, May 21, 2013  Site/dose: Proximal vagina 30 Gy in 5 fractions  Beams/energy: Patient was treated with intracavitary brachytherapy treatments alone using iridium 192 as the high-dose-rate source. The patient was treated with a 3 cm diameter cylinder. Prescription was  to the mucosal surface. Treatment length was 4 cm  Pap 12/27/2013 LSIL +hrHPV DNA Colposcopy 01/2014 Vagina, biopsy - BENIGN SQUAMOUS MUCOSA. - NO DYSPLASIA OR EVIDENCE OF MALIGNANCY.  Repeat pap 05/2014 [facility] HPV+ Reports interval psychiatric hospitalization. Mental heath is managed by Domenica Fail PA.   Has remained a non smoker.  Social Hx:   Social History   Social History  . Marital Status: Divorced    Spouse Name: N/A  . Number of Children: N/A  . Years of Education: N/A   Occupational History  . Not on file.   Social History Main Topics  . Smoking status: Former Smoker -- 0.50 packs/day for 26 years    Types: Cigarettes    Quit date: 08/03/2013  . Smokeless tobacco: Never Used  . Alcohol Use: No  . Drug Use: No  . Sexual Activity: Not Currently    Birth Control/ Protection: Surgical   Other Topics Concern  . Not on file   Social History Narrative  Stopped smoking 1 month ago.  Is on a nicotine patch.  Exercises daily and is on Weight Watchers.  Has lost 28 pounds.  Past Surgical Hx:  Past Surgical History  Procedure Laterality Date  . Cesarean section      X2  . Tubal ligation    . Dilitation & currettage/hystroscopy with hydrothermal ablation N/A 01/01/2013    Procedure: DILATATION & CURETTAGE/HYSTEROSCOPY WITH ATTEMPTED HYDROTHERMAL ABLATION: Change over to [place] @ 1505;  Surgeon: Osborne Oman, MD;  Location: [facility];  Service: Gynecology;  Laterality: N/A;  . Laparoscopic assisted vaginal hysterectomy N/A 01/22/2013    Procedure: LAPAROSCOPIC ASSISTED VAGINAL HYSTERECTOMY with Bilateral Fallopian Tubes and Ovaries;  Surgeon: Osborne Oman, MD;  Location: [facility];  Service: Gynecology;  Laterality: N/A;  . Robotic assisted total hysterectomy with bilateral salpingo oopherectomy Bilateral 03/31/2013    Procedure: ROBOTIC ASSISTED BILATERAL OOPHORECTOMY PELVIC AND POSSIBLE PARA-AORTIC LYMPH NODE DISSECTION;  Surgeon: Lucita Lora. Alycia Rossetti, MD;   Location: [facility];  Service: Gynecology;  Laterality: Bilateral;    Past Medical Hx:  Past Medical History  Diagnosis Date  . Anemia   . Menorrhagia   . Depression   . Anxiety   . Headache(784.0)     MIGRAINES  . Complication of anesthesia     PT STATES SHE WOKE UP AFTER D&C [place].  STATES NO PROBLEMS WAKING UP AFTER HER HYSTERECTOMY  . History of radiation therapy 04/23/13, 04/29/13, 05/07/13, 05/14/13, 05/21/13    30 Gy to proximal vagina  . Migraines   . Neuropathy   . Cancer     ENDOMETRIAL CANCER (tx completed 05/2013)    Past Gynecological History: Gravida 2 per 2 menarche at age 73 with regular menses until 6 months ago. Reports oral contraceptive pill use for 14 years in the remote past Patient's last menstrual period was 11/03/2012. Denies h/o abnormal pap prior to hysterectomy.  Family Hx:  Family History  Problem Relation Age of Onset  . Diabetes Mother   . Hypertension Mother   . Cancer Neg Hx    ROS:  No cough no abdominal pain, no nausea or vomiting.  28 pound weight loss on Wellbutrin and weight watchers.  Neuropathic left leg pain managed with ibuprofen. Reports vasomotor symptoms not well managed with effexor.  Reports white vaginal discharge since administration of abx.  Vitals:  Blood pressure 139/75, pulse 72, temperature 98.2 F (36.8 C), temperature source Oral, resp. rate 20, height '5\' 6"'  (1.676 m), weight 280 lb (127.007 kg), last menstrual period 11/03/2012, SpO2 98 %. Wt Readings from Last 3 Encounters:  09/02/14 280 lb (127.007 kg)  05/06/14 251 lb 8 oz (114.08 kg)  04/24/14 248 lb (112.492 kg)  There is no weight on file to calculate BMI.  Physical Exam: WD in NAD  Chest:  CTA Cardiac:  RRR Psychiatry: Alert and oriented to person, place, and time  Abdomen : [facility] surgical sites intact without evidence of hernia or masses.  LN:  No cervical supraclavicular or inguinal adenopathy Back:  No CVAT Pelvic:  Nl EGBUS no masses  bleeding or discharge.  Atropic vagina with changes c/w vaginal candidiasis.  No masses Rectal:  Good tone no masses EXT:  No CCE Psychiatry:  Mildy pressured speech, very talkative,

## 2014-12-16 ENCOUNTER — Ambulatory Visit: Payer: Self-pay | Attending: Gynecologic Oncology | Admitting: Gynecologic Oncology

## 2014-12-16 ENCOUNTER — Encounter: Payer: Self-pay | Admitting: Gynecologic Oncology

## 2014-12-16 ENCOUNTER — Other Ambulatory Visit (HOSPITAL_BASED_OUTPATIENT_CLINIC_OR_DEPARTMENT_OTHER): Payer: Self-pay

## 2014-12-16 ENCOUNTER — Other Ambulatory Visit (HOSPITAL_COMMUNITY)
Admission: RE | Admit: 2014-12-16 | Discharge: 2014-12-16 | Disposition: A | Discharge status: Home / Self Care | Payer: Self-pay | Source: Ambulatory Visit | Attending: Gynecologic Oncology | Admitting: Gynecologic Oncology

## 2014-12-16 ENCOUNTER — Other Ambulatory Visit: Payer: Self-pay | Admitting: *Deleted

## 2014-12-16 VITALS — BP 125/54 | HR 67 | Temp 98.1°F | Resp 16 | Ht 66.0 in | Wt 288.0 lb

## 2014-12-16 DIAGNOSIS — R140 Abdominal distension (gaseous): Secondary | ICD-10-CM | POA: Insufficient documentation

## 2014-12-16 DIAGNOSIS — R87629 Unspecified abnormal cytological findings in specimens from vagina: Secondary | ICD-10-CM

## 2014-12-16 DIAGNOSIS — K625 Hemorrhage of anus and rectum: Secondary | ICD-10-CM

## 2014-12-16 DIAGNOSIS — Z01411 Encounter for gynecological examination (general) (routine) with abnormal findings: Secondary | ICD-10-CM | POA: Insufficient documentation

## 2014-12-16 DIAGNOSIS — R5383 Other fatigue: Secondary | ICD-10-CM

## 2014-12-16 DIAGNOSIS — R5381 Other malaise: Secondary | ICD-10-CM

## 2014-12-16 DIAGNOSIS — R87628 Other abnormal cytological findings on specimens from vagina: Secondary | ICD-10-CM | POA: Insufficient documentation

## 2014-12-16 DIAGNOSIS — R350 Frequency of micturition: Secondary | ICD-10-CM | POA: Insufficient documentation

## 2014-12-16 DIAGNOSIS — C541 Malignant neoplasm of endometrium: Secondary | ICD-10-CM | POA: Insufficient documentation

## 2014-12-16 LAB — URINALYSIS, MICROSCOPIC - CHCC
Bilirubin (Urine): NEGATIVE
Blood: NEGATIVE
GLUCOSE UR CHCC: NEGATIVE mg/dL
KETONES: NEGATIVE mg/dL
LEUKOCYTE ESTERASE: NEGATIVE
Nitrite: NEGATIVE
PH: 8.5 (ref 4.6–8.0)
PROTEIN: NEGATIVE mg/dL
RBC / HPF: NEGATIVE (ref 0–2)
Specific Gravity, Urine: 1.01 (ref 1.003–1.035)
UROBILINOGEN UR: 0.2 mg/dL (ref 0.2–1)
WBC UA: NEGATIVE (ref 0–2)

## 2014-12-16 LAB — CBC WITH DIFFERENTIAL/PLATELET
BASO%: 1.8 % (ref 0.0–2.0)
Basophils Absolute: 0.1 10*3/uL (ref 0.0–0.1)
EOS%: 3.7 % (ref 0.0–7.0)
Eosinophils Absolute: 0.3 10*3/uL (ref 0.0–0.5)
HCT: 41.2 % (ref 34.8–46.6)
HGB: 14 g/dL (ref 11.6–15.9)
LYMPH#: 2.8 10*3/uL (ref 0.9–3.3)
LYMPH%: 39.1 % (ref 14.0–49.7)
MCH: 29.3 pg (ref 25.1–34.0)
MCHC: 34 g/dL (ref 31.5–36.0)
MCV: 86.2 fL (ref 79.5–101.0)
MONO#: 0.5 10*3/uL (ref 0.1–0.9)
MONO%: 7 % (ref 0.0–14.0)
NEUT#: 3.5 10*3/uL (ref 1.5–6.5)
NEUT%: 48.4 % (ref 38.4–76.8)
Platelets: 353 10*3/uL (ref 145–400)
RBC: 4.78 10*6/uL (ref 3.70–5.45)
RDW: 13.2 % (ref 11.2–14.5)
WBC: 7.2 10*3/uL (ref 3.9–10.3)

## 2014-12-16 LAB — COMPREHENSIVE METABOLIC PANEL (CC13)
ALT: 76 U/L — ABNORMAL HIGH (ref 0–55)
AST: 54 U/L — AB (ref 5–34)
Albumin: 3.8 g/dL (ref 3.5–5.0)
Alkaline Phosphatase: 102 U/L (ref 40–150)
Anion Gap: 7 mEq/L (ref 3–11)
BUN: 18.5 mg/dL (ref 7.0–26.0)
CHLORIDE: 111 meq/L — AB (ref 98–109)
CO2: 23 meq/L (ref 22–29)
CREATININE: 0.7 mg/dL (ref 0.6–1.1)
Calcium: 10 mg/dL (ref 8.4–10.4)
EGFR: >90 mL/min/{1.73_m2} (ref >90)
GLUCOSE: 83 mg/dL (ref 70–140)
Potassium: 4.1 mEq/L (ref 3.5–5.1)
SODIUM: 141 meq/L (ref 136–145)
Total Protein: 8.5 g/dL — ABNORMAL HIGH (ref 6.4–8.3)

## 2014-12-16 NOTE — Addendum Note (Signed)
Addended by: Christa See on: 12/16/2014 02:07 PM   Modules accepted: Orders

## 2014-12-16 NOTE — Patient Instructions (Signed)
Follow-up in Dr. Skeet Latch in November as scheduled. We will call you with the results of your pap smear and labs from today.

## 2014-12-16 NOTE — Progress Notes (Signed)
GYN ONCOLOGY OFFICE VISIT   CC:  Endometrial cancer surveillance, Abnormal pap +hrHPVDNA ,nausea, abdominal bloating Assessment/Plan:  [name]  is a 44 y.o.  year old status post laparoscopic hysterectomy bilateral salpingectomy by Dr. Harolyn Rutherford  for abnormal uterine bleeding with 2 prior endometrial sampling that were negative for malignancy. The specimen was morcellated into 4 pieces to facilitate removal from the vagina. 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 Based on the grade and lymphovascular space involvement is recommended that she undergo vaginal cuff brachytherapy.Treatment was completed May 21, 2013. Follow-up with Gyn Onc in 3 months Mammogram  Abnormal pap 12/2013 LSIL HPV+ colpo neg Repeat pap 05/2014 ASCUS hrHPV +  Per ASCCP colposcopy guidelines there isn't an indication for repeat pap for 12 months after colposcopy.  Repeat Pap today.   If HPV + repeat pap again in 12 months.  Abdominal bloating malaise ? 1cm left vaginal cuff mass. CT abd pelvis  To assess for disease recurrence  If imaging is negative will refer for management of cholelithiasis  Fatigue/malaise CBC metabolic panel pending.  Rectal bleeding Referral for colonoscopy if CT negative  Follow-up with Dr. Skeet Latch 01/13/2015   HPI: [name]  is a 44 y.o.G2 P2 last normal menstrual period in 11/03/2012. She reports abnormal uterine bleeding that began approximately 6 months ago. It became very heavy in September 2014 when she presented for evaluation and was initially placed on Megace.  12/01/12 ECC - BENIGN ENDOCERVICAL MUCOSA, - DETACHED FRAGMENTS OF SQUAMOUS EPITHELIUM; NEGATIVE FOR INTRAEPITHELIAL LESION OR MALIGANNCY. Endometrium,  biopsy - FRAGMENTS OF ENDOMETRIOID-TYPE POLYP(S) WITH DECIDUALIZATION OF THE STROMA, CONSISTENT WITH HORMONE EFFECT. - THERE IS NO EVIDENCE OF HYPERPLASIA OR MALIGNANCY. Repeat biopsy on 01/02/2013 Endometrium, curettage - FRAGMENTS SUGGESTIVE OF ENDOMETRIOID TYPE POLYP(S) WITH DECIDUALIZATION OF THE STROMA, CONSISTENT WITH HORMONE EFFECT. - BENIGN ENDOCERVICAL TYPE MUCOSA. - THERE IS NO EVIDENCE OF HYPERPLASIA OR MALIGNANCY.  On 01/22/2013 she underwent a total laparoscopic hysterectomy unilateral salpingectomy with "some  vaginal morcellation to deliver the uterus vaginally after the hysterectomy was completed"  Uterus and bilateral fallopian tubes, with cervix ( 4 sections) Specimen: Uterus, cervix, one fallopian tube Procedure: Hysterectomy and salpingectomy Lymph node sampling performed: No Specimen integrity: Fragmented, please see gross description for details. Maximum tumor size: Estimated size 6.5 cm, grossly Histologic type: Invasive endometrioid carcinoma Grade: FIGO Grade II Myometrial invasion: 0.9 cm where myometrium is 2.4 cm in thickness Cervical stromal involvement: No Lymph - vascular invasion: Present, extensively FIGO Stage (based on pathologic findings, needs clinical correlation): At least IA Comment:  Sections show an invasive endometrioid carcinoma involving the upper portion of the uterus, estimated 6.5 cm in greatest dimension. There is extensive angiolymphatic invasion present. IHC Expression Result: MLH1: Preserved nuclear expression (greater 50% tumor expression) MSH2: Preserved nuclear expression (greater 50% tumor expression) MSH6: Preserved nuclear expression (greater 50% tumor expression) PMS2: Preserved nuclear expression (greater 50% tumor expression) * Internal control demonstrates intact nuclear expression  Discussion with Dr. Harolyn Rutherford confirmed that there was vaginal not intra-abdominal morcellation and that both fallopian tubes were removed, although  only one was in the pathology specimen.  CT 12/17/2015to assess for nodal or intraperioneal disease notable for no retroperitoneal or retrocrural adenopathy. Normal colon, appendix, and terminal ileum. Normal small bowel without abdominal ascites. No evidence  of omental  or peritoneal disease. No pelvic adenopathy. Residual normal appearing left ovary on image 72 and right ovary on image 76. No adnexal mass. There is mild pelvic fascia thickening and trace fluid which is presumably postoperative.   On 03/31/13 she underwent: Robotic bilateral oophorectomy, left salpingectomy, bilateral pelvic lymph node dissection, right PA node sampling   Pathology: Bilateral ovaries, left fallopian tube, Right para-aortic lymph nodes, bilateral pelvic lymph nodes to pathology.   Operative findings: Status post hysterectomy with mild adhesive disease of the rectosigmoid colon to the left adnexa. Significant intra-abdominal obesity. Prominent bilateral pelvic lymph nodes.  Pathology revealed: Diagnosis 1. Ovary and fallopian tube, left - BENIGN OVARY WITH CYSTIC FOLLICLES. - SURFACE FOREIGN BODY GIANT CELL REACTION. - THERE IS NO EVIDENCE OF MALIGNANCY. - SEE COMMENT. 2. Ovary and fallopian tube, right - BENIGN OVARY WITH CYSTIC FOLLICLES. - THERE IS NO EVIDENCE OF MALIGNANCY. - SEE COMMENT. 3. Lymph nodes, regional resection, right pelvic - THERE IS NO EVIDENCE OF CARCINOMA IN 10 OF 10 LYMPH NODES (0/10). 4. Lymph nodes, regional resection, left pelvic - THERE IS NO EVIDENCE OF CARCINOMA IN 7 OF 7 LYMPH NODES (0/7). 5. Lymph nodes, regional resection, right para-aortic - THERE IS NO EVIDENCE OF CARCINOMA IN 1 OF 1 LYMPH NODE (0/1).  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   Given extensive LVSI adjuvant bracytherapy was recommended. Radiation treatment dates: February 19th, February 25, March 5, March 12, May 21, 2013  Site/dose:  Proximal vagina 30 Gy in 5 fractions  Beams/energy: Patient was treated with intracavitary brachytherapy treatments alone using iridium 192 as the high-dose-rate source. The patient was treated with a 3 cm diameter cylinder. Prescription was to the mucosal surface. Treatment length was 4 cm  Pap 12/27/2013 LSIL +hrHPV DNA Colposcopy 01/2014 Vagina, biopsy - BENIGN SQUAMOUS MUCOSA. - NO DYSPLASIA OR EVIDENCE OF MALIGNANCY.  Repeat pap 05/2014 ASCUCs HPV+ Reports interval psychiatric hospitalization. Mental heath is managed by Domenica Fail PA.   Has remained a non smoker.  At this visit Ayaan Ringle Felty reports three weeks of abdominal bloating, decreased appetite and nausea.  Nausea is not associated with eating.  Also reports fatigue and weakness.  Reports urinary frequency without dysuria, reports more frequent bowel movements some associated with blood.  Is distressed by the change in her physical well being  Social Hx:   Social History   Social History  . Marital Status: Divorced    Spouse Name: N/A  . Number of Children: N/A  . Years of Education: N/A   Occupational History  . Not on file.   Social History Main Topics  . Smoking status: Former Smoker -- 0.50 packs/day for 26 years    Types: Cigarettes    Quit date: 08/03/2013  . Smokeless tobacco: Never Used  . Alcohol Use: No  . Drug Use: No  . Sexual Activity: Not Currently    Birth Control/ Protection: Surgical   Other Topics Concern  . Not on file   Social History Narrative  Stopped smoking. Exercises daily and is on Weight Watchers.  Has lost 28 pounds.  Past Surgical Hx:  Past Surgical History  Procedure Laterality Date  . Cesarean section      X2  . Tubal ligation    . Dilitation & currettage/hystroscopy with hydrothermal ablation N/A 01/01/2013    Procedure: DILATATION & CURETTAGE/HYSTEROSCOPY WITH ATTEMPTED HYDROTHERMAL ABLATION: Change over to [place] @  1505;  Surgeon: Osborne Oman, MD;   Location: [facility];  Service: Gynecology;  Laterality: N/A;  . Laparoscopic assisted vaginal hysterectomy N/A 01/22/2013    Procedure: LAPAROSCOPIC ASSISTED VAGINAL HYSTERECTOMY with Bilateral Fallopian Tubes and Ovaries;  Surgeon: Osborne Oman, MD;  Location: [facility];  Service: Gynecology;  Laterality: N/A;  . Robotic assisted total hysterectomy with bilateral salpingo oopherectomy Bilateral 03/31/2013    Procedure: ROBOTIC ASSISTED BILATERAL OOPHORECTOMY PELVIC AND POSSIBLE PARA-AORTIC LYMPH NODE DISSECTION;  Surgeon: Imagene Gurney A. Alycia Rossetti, MD;  Location: [facility];  Service: Gynecology;  Laterality: Bilateral;    Past Medical Hx:  Past Medical History  Diagnosis Date  . Anemia   . Menorrhagia   . Depression   . Anxiety   . Headache(784.0)     MIGRAINES  . Complication of anesthesia     PT STATES SHE WOKE UP AFTER D&C [place].  STATES NO PROBLEMS WAKING UP AFTER HER HYSTERECTOMY  . History of radiation therapy 04/23/13, 04/29/13, 05/07/13, 05/14/13, 05/21/13    30 Gy to proximal vagina  . Migraines   . Neuropathy ([place])   . Cancer [facility])     ENDOMETRIAL CANCER (tx completed 05/2013)    Past Gynecological History: Gravida 2 per 2 menarche at age 17 with regular menses until 6 months ago. Reports oral contraceptive pill use for 14 years in the remote past Patient's last menstrual period was 11/03/2012. Denies h/o abnormal pap prior to hysterectomy.  Family Hx:  Family History  Problem Relation Age of Onset  . Diabetes Mother   . Hypertension Mother   . Cancer Neg Hx    ROS:  No cough no abdominal pain bu abdominal pain and nausea no vomiting.  Reports urinary frequency without dysuria, reports more frequent BM with occasional hematochezia. Reports weight gain and poor appetite, Reports hydradenitis lesions under both arms.  Otherwise 10 point ROS negative  Vitals:  Blood pressure 125/54, pulse 67, temperature 98.1 F (36.7 C), temperature source Oral, resp. rate 16,  height '5\' 6"'  (1.676 m), weight 288 lb (130.636 kg), last menstrual period 11/03/2012. Wt Readings from Last 3 Encounters:  12/16/14 288 lb (130.636 kg)  09/02/14 280 lb (127.007 kg)  05/06/14 251 lb 8 oz (114.08 kg)  Body mass index is 46.51 kg/(m^2).  Physical Exam: WD frustrated, tearful Chest:  CTA Cardiac:  RRR Psychiatry: Alert and oriented to person, place, and time , agitated, some pressured speech Abdomen : [facility] surgical sites intact without evidence of hernia or masses. Distended, obese, no RUQ pain LN:  No cervical supraclavicular or inguinal adenopathy Back:  No CVAT Pelvic:  Nl EGBUS no masses bleeding or discharge.  Atropic vagina ? 1cm mass at the left vaginal cuff. Rectal:  Good tone no masses, external hemmorhoids EXT:  No CCE Psychiatry:  Mildy pressured speech, very talkative,  Skin:  Hydra-adenitis lesions under the arm

## 2014-12-17 ENCOUNTER — Ambulatory Visit (HOSPITAL_COMMUNITY)
Admission: RE | Admit: 2014-12-17 | Discharge: 2014-12-17 | Disposition: A | Discharge status: Home / Self Care | Payer: Self-pay | Source: Ambulatory Visit | Attending: Gynecologic Oncology | Admitting: Gynecologic Oncology

## 2014-12-17 ENCOUNTER — Encounter (HOSPITAL_COMMUNITY): Payer: Self-pay

## 2014-12-17 DIAGNOSIS — C541 Malignant neoplasm of endometrium: Secondary | ICD-10-CM

## 2014-12-17 DIAGNOSIS — Z90710 Acquired absence of both cervix and uterus: Secondary | ICD-10-CM | POA: Insufficient documentation

## 2014-12-17 DIAGNOSIS — K8020 Calculus of gallbladder without cholecystitis without obstruction: Secondary | ICD-10-CM | POA: Insufficient documentation

## 2014-12-17 DIAGNOSIS — Z8542 Personal history of malignant neoplasm of other parts of uterus: Secondary | ICD-10-CM | POA: Insufficient documentation

## 2014-12-17 DIAGNOSIS — R5383 Other fatigue: Secondary | ICD-10-CM

## 2014-12-17 DIAGNOSIS — K760 Fatty (change of) liver, not elsewhere classified: Secondary | ICD-10-CM | POA: Insufficient documentation

## 2014-12-17 DIAGNOSIS — R140 Abdominal distension (gaseous): Secondary | ICD-10-CM

## 2014-12-17 DIAGNOSIS — R5381 Other malaise: Secondary | ICD-10-CM | POA: Insufficient documentation

## 2014-12-17 MED ORDER — IOHEXOL 300 MG/ML  SOLN
100.0000 mL | Freq: Once | INTRAMUSCULAR | Status: AC | PRN
  Administered 2014-12-17: 100 mL via INTRAVENOUS

## 2014-12-17 NOTE — Addendum Note (Signed)
Addended by: [name] D on: 12/17/2014 11:24 AM   Modules accepted: Orders

## 2014-12-20 ENCOUNTER — Telehealth: Payer: Self-pay | Admitting: *Deleted

## 2014-12-20 DIAGNOSIS — R110 Nausea: Secondary | ICD-10-CM

## 2014-12-20 MED ORDER — PROMETHAZINE HCL 25 MG PO TABS: ORAL_TABLET | ORAL | Status: DC

## 2014-12-20 NOTE — Telephone Encounter (Signed)
Per Dr. Denman George, patient needs referral to gastroenterologist. Called and verified with patient that she has not seen a GI doctor previously. Appointment made at [facility] on Thursday, 01-06-15, at 9:30am (with 9:15 arrival time) with Dr. Oletta Lamas. Patient notified of appt information and she is agreeable to appt. Gave patient [facility] contact number in case she needs to r/s appt for any reason. Discussed CT results with patient. Patient states she is still nauseated and is out of phenergan tablets. Prescription sent to patient's pharmacy and instructed patient to not drive while taking phenergan. If nausea or other symptoms continue, patient instructed to report to the nearest emergency department for further evaluation. Patient agreeable to this.   Per Dr. Skeet Latch, patient needs referral to [facility] for evaluation of gallstones. VM left for new patient coordinator at [facility]. Will notify patient of appt with [facility] once appt is scheduled.

## 2014-12-21 ENCOUNTER — Telehealth: Payer: Self-pay

## 2014-12-21 LAB — CYTOLOGY - PAP

## 2014-12-21 NOTE — Telephone Encounter (Signed)
Orders received from Dr Janie Morning to contact [facility] for evaluation for Gallstones ,  [place] : [phone] , Fax [phone] . Contacted Jessica new patient coordinator to schedule evaluation , patient scheduled for next Wednesday October 26 , 2016 at 2 PM . Patient to arrive at 1:30 PM with picture ID and her current medication. Patient's fee will be $238.00 upon arrival , patient aware and agreeable to appointment as scheduled , records faxed to [facility].

## 2014-12-22 ENCOUNTER — Telehealth: Payer: Self-pay

## 2014-12-22 NOTE — Telephone Encounter (Signed)
Orders received from [facility] to contact the patient with PAP results being "normal" from MD visit on 12/16/14 with Dr Skeet Latch. Patient contacted with PAP results being normal , patient states understanding , denies further questions or concerns at this time.

## 2014-12-23 ENCOUNTER — Emergency Department (HOSPITAL_COMMUNITY): Payer: Self-pay

## 2014-12-23 ENCOUNTER — Telehealth: Payer: Self-pay

## 2014-12-23 ENCOUNTER — Encounter (HOSPITAL_COMMUNITY): Payer: Self-pay | Admitting: Emergency Medicine

## 2014-12-23 ENCOUNTER — Emergency Department (HOSPITAL_COMMUNITY)
Admission: EM | Admit: 2014-12-23 | Discharge: 2014-12-23 | Disposition: A | Discharge status: Home / Self Care | Payer: Self-pay | Attending: Emergency Medicine | Admitting: Emergency Medicine

## 2014-12-23 DIAGNOSIS — Z79899 Other long term (current) drug therapy: Secondary | ICD-10-CM | POA: Insufficient documentation

## 2014-12-23 DIAGNOSIS — Z9851 Tubal ligation status: Secondary | ICD-10-CM | POA: Insufficient documentation

## 2014-12-23 DIAGNOSIS — Z8542 Personal history of malignant neoplasm of other parts of uterus: Secondary | ICD-10-CM | POA: Insufficient documentation

## 2014-12-23 DIAGNOSIS — K8020 Calculus of gallbladder without cholecystitis without obstruction: Secondary | ICD-10-CM | POA: Insufficient documentation

## 2014-12-23 DIAGNOSIS — Z8742 Personal history of other diseases of the female genital tract: Secondary | ICD-10-CM | POA: Insufficient documentation

## 2014-12-23 DIAGNOSIS — F419 Anxiety disorder, unspecified: Secondary | ICD-10-CM | POA: Insufficient documentation

## 2014-12-23 DIAGNOSIS — Z792 Long term (current) use of antibiotics: Secondary | ICD-10-CM | POA: Insufficient documentation

## 2014-12-23 DIAGNOSIS — Z87891 Personal history of nicotine dependence: Secondary | ICD-10-CM | POA: Insufficient documentation

## 2014-12-23 DIAGNOSIS — G629 Polyneuropathy, unspecified: Secondary | ICD-10-CM | POA: Insufficient documentation

## 2014-12-23 DIAGNOSIS — G43909 Migraine, unspecified, not intractable, without status migrainosus: Secondary | ICD-10-CM | POA: Insufficient documentation

## 2014-12-23 DIAGNOSIS — R197 Diarrhea, unspecified: Secondary | ICD-10-CM | POA: Insufficient documentation

## 2014-12-23 DIAGNOSIS — F329 Major depressive disorder, single episode, unspecified: Secondary | ICD-10-CM | POA: Insufficient documentation

## 2014-12-23 DIAGNOSIS — Z90710 Acquired absence of both cervix and uterus: Secondary | ICD-10-CM | POA: Insufficient documentation

## 2014-12-23 LAB — COMPREHENSIVE METABOLIC PANEL
ALK PHOS: 90 U/L (ref 38–126)
ALT: 93 U/L — AB (ref 14–54)
AST: 84 U/L — AB (ref 15–41)
Albumin: 4.3 g/dL (ref 3.5–5.0)
Anion gap: 7 (ref 5–15)
BUN: 13 mg/dL (ref 6–20)
CALCIUM: 9.7 mg/dL (ref 8.9–10.3)
CHLORIDE: 106 mmol/L (ref 101–111)
CO2: 24 mmol/L (ref 22–32)
CREATININE: 0.65 mg/dL (ref 0.44–1.00)
GFR calc non Af Amer: >60 mL/min (ref >60)
GLUCOSE: 85 mg/dL (ref 65–99)
Potassium: 3.9 mmol/L (ref 3.5–5.1)
SODIUM: 137 mmol/L (ref 135–145)
Total Bilirubin: 0.6 mg/dL (ref 0.3–1.2)
Total Protein: 8.9 g/dL — ABNORMAL HIGH (ref 6.5–8.1)

## 2014-12-23 LAB — CBC
HCT: 42.9 % (ref 36.0–46.0)
Hemoglobin: 14.7 g/dL (ref 12.0–15.0)
MCH: 29.1 pg (ref 26.0–34.0)
MCHC: 34.3 g/dL (ref 30.0–36.0)
MCV: 84.8 fL (ref 78.0–100.0)
Platelets: 376 10*3/uL (ref 150–400)
RBC: 5.06 MIL/uL (ref 3.87–5.11)
RDW: 13.1 % (ref 11.5–15.5)
WBC: 6.9 10*3/uL (ref 4.0–10.5)

## 2014-12-23 LAB — URINALYSIS, ROUTINE W REFLEX MICROSCOPIC
Bilirubin Urine: NEGATIVE
Glucose, UA: NEGATIVE mg/dL
Hgb urine dipstick: NEGATIVE
Ketones, ur: NEGATIVE mg/dL
Leukocytes, UA: NEGATIVE
NITRITE: NEGATIVE
Protein, ur: NEGATIVE mg/dL
SPECIFIC GRAVITY, URINE: 1.02 (ref 1.005–1.030)
UROBILINOGEN UA: 1 mg/dL (ref 0.0–1.0)
pH: 8.5 — ABNORMAL HIGH (ref 5.0–8.0)

## 2014-12-23 LAB — LIPASE, BLOOD: LIPASE: 28 U/L (ref 11–51)

## 2014-12-23 MED ORDER — SODIUM CHLORIDE 0.9 % IV BOLUS (SEPSIS)
1000.0000 mL | Freq: Once | INTRAVENOUS | Status: AC
  Administered 2014-12-23: 1000 mL via INTRAVENOUS

## 2014-12-23 MED ORDER — ONDANSETRON HCL 4 MG/2ML IJ SOLN
4.0000 mg | Freq: Once | INTRAMUSCULAR | Status: AC
  Administered 2014-12-23: 4 mg via INTRAVENOUS
  Filled 2014-12-23: qty 2

## 2014-12-23 MED ORDER — DIPHENOXYLATE-ATROPINE 2.5-0.025 MG PO TABS: 2.0000 | ORAL_TABLET | Freq: Four times a day (QID) | ORAL | Status: DC | PRN

## 2014-12-23 MED ORDER — HYDROCODONE-ACETAMINOPHEN 5-325 MG PO TABS: 1.0000 | ORAL_TABLET | ORAL | Status: DC | PRN

## 2014-12-23 MED ORDER — ONDANSETRON HCL 4 MG PO TABS: 4.0000 mg | ORAL_TABLET | Freq: Four times a day (QID) | ORAL | Status: DC

## 2014-12-23 NOTE — Telephone Encounter (Signed)
Patient called with concerns of frequent diarrhea and states she has "lost" 10 lbs over the coarse of 7 days. Melissa Cross , APNP updated , orders received to have the patient go to the ED for evaluation of her gallstones. Patient agreed with plan and is waiting for a ride to be evaluated at the ED , denies further questions or concerns at this time.

## 2014-12-23 NOTE — ED Notes (Signed)
Went in to stick for blood, pt wants to wait for IV due to recent trouble finding veins

## 2014-12-23 NOTE — ED Notes (Signed)
Enteric Precautions initiated.

## 2014-12-23 NOTE — ED Notes (Signed)
Per pt, states has a history of gallstones-nausea/diarrhea since Saturday

## 2014-12-23 NOTE — ED Provider Notes (Signed)
CSN: [id number]     Arrival date & time 12/23/14  1241 History   First MD Initiated Contact with Patient 12/23/14 1422     Chief Complaint  Patient presents with  . nausea/diarrhea      (Consider location/radiation/quality/duration/timing/severity/associated sxs/prior Treatment) HPI Comments: Presents to the emergency department for evaluation of diarrhea and weight loss. Patient has been expressing nausea but has not had vomiting. She reports that she has not been able to eat because of the nausea. She reports 10 pound weight loss recently. She continues to have diarrhea without any significant abdominal pain. Patient was recently diagnosed with gallstones. Her oncologist told her to come to the ER to be further evaluated for possible gallbladder disease causing her current symptoms.   Past Medical History  Diagnosis Date  . Anemia   . Menorrhagia   . Depression   . Anxiety   . Headache(784.0)     MIGRAINES  . Complication of anesthesia     PT STATES SHE WOKE UP AFTER D&C [place].  STATES NO PROBLEMS WAKING UP AFTER HER HYSTERECTOMY  . History of radiation therapy 04/23/13, 04/29/13, 05/07/13, 05/14/13, 05/21/13    30 Gy to proximal vagina  . Migraines   . Neuropathy ([place])   . Cancer [facility])     ENDOMETRIAL CANCER (tx completed 05/2013)   Past Surgical History  Procedure Laterality Date  . Cesarean section      X2  . Tubal ligation    . Dilitation & currettage/hystroscopy with hydrothermal ablation N/A 01/01/2013    Procedure: DILATATION & CURETTAGE/HYSTEROSCOPY WITH ATTEMPTED HYDROTHERMAL ABLATION: Change over to [place] @ 1505;  Surgeon: Osborne Oman, MD;  Location: [facility];  Service: Gynecology;  Laterality: N/A;  . Laparoscopic assisted vaginal hysterectomy N/A 01/22/2013    Procedure: LAPAROSCOPIC ASSISTED VAGINAL HYSTERECTOMY with Bilateral Fallopian Tubes and Ovaries;  Surgeon: Osborne Oman, MD;  Location: [facility];  Service:  Gynecology;  Laterality: N/A;  . Robotic assisted total hysterectomy with bilateral salpingo oopherectomy Bilateral 03/31/2013    Procedure: ROBOTIC ASSISTED BILATERAL OOPHORECTOMY PELVIC AND POSSIBLE PARA-AORTIC LYMPH NODE DISSECTION;  Surgeon: Imagene Gurney A. Alycia Rossetti, MD;  Location: [facility];  Service: Gynecology;  Laterality: Bilateral;   Family History  Problem Relation Age of Onset  . Diabetes Mother   . Hypertension Mother   . Cancer Neg Hx    Social History  Substance Use Topics  . Smoking status: Former Smoker -- 0.50 packs/day for 26 years    Types: Cigarettes    Quit date: 08/03/2013  . Smokeless tobacco: Never Used  . Alcohol Use: No   OB History    Gravida Para Term Preterm AB TAB SAB Ectopic Multiple Living   '2 2 2       2     '$ Review of Systems  Gastrointestinal: Positive for nausea and diarrhea.  All other systems reviewed and are negative.     Allergies  Review of patient's allergies indicates no known allergies.  Home Medications   Prior to Admission medications   Medication Sig Start Date End Date Taking? Authorizing Provider  amoxicillin (AMOXIL) 500 MG tablet Take 500 mg by mouth once. For dental appointments   Yes Historical Provider, MD  clindamycin (CLINDAGEL) 1 % gel Apply 1 application topically 2 (two) times daily. Rash under arms   Yes Historical Provider, MD  cloNIDine (CATAPRES) 0.1 MG tablet Take 1 tablet (0.1 mg total) by mouth daily. 09/02/14  Yes Melissa D  Cross, NP  DULoxetine (CYMBALTA) 60 MG capsule Take 60 mg by mouth daily.   Yes Historical Provider, MD  ibuprofen (ADVIL,MOTRIN) 800 MG tablet Take 1 tablet (800 mg total) by mouth every 8 (eight) hours as needed for mild pain. 09/02/14  Yes Melissa D Cross, NP  loratadine (CLARITIN) 10 MG tablet Take 10 mg by mouth daily.   Yes Historical Provider, MD  promethazine (PHENERGAN) 25 MG tablet Take one half to one tablet by mouth twice daily as needed for nausea. DO NOT DRIVE WHEN TAKING MEDICATION.  12/20/14  Yes Melissa D Cross, NP  topiramate (TOPAMAX) 100 MG tablet Take 100 mg by mouth daily.    Yes Historical Provider, MD  venlafaxine (EFFEXOR) 75 MG tablet Take 75 mg by mouth 2 (two) times daily.   Yes Historical Provider, MD  naproxen (NAPROSYN) 250 MG tablet Take 1 tablet (250 mg total) by mouth 2 (two) times daily as needed for mild pain or moderate pain (take with food). Patient not taking: Reported on 12/23/2014 11/19/14   Francine Graven, DO   BP 138/75 mmHg  Pulse 81  Temp(Src) 97.7 F (36.5 C)  Resp 16  Wt 275 lb 8 oz (124.966 kg)  SpO2 100%  LMP 11/03/2012 Physical Exam  Constitutional: She is oriented to person, place, and time. She appears well-developed and well-nourished. No distress.  HENT:  Head: Normocephalic and atraumatic.  Right Ear: Hearing normal.  Left Ear: Hearing normal.  Nose: Nose normal.  Mouth/Throat: Oropharynx is clear and moist and mucous membranes are normal.  Eyes: Conjunctivae and EOM are normal. Pupils are equal, round, and reactive to light.  Neck: Normal range of motion. Neck supple.  Cardiovascular: Regular rhythm, S1 normal and S2 normal.  Exam reveals no gallop and no friction rub.   No murmur heard. Pulmonary/Chest: Effort normal and breath sounds normal. No respiratory distress. She exhibits no tenderness.  Abdominal: Soft. Normal appearance and bowel sounds are normal. There is no hepatosplenomegaly. There is generalized tenderness. There is no rebound, no guarding, no tenderness at McBurney's point and negative Murphy's sign. No hernia.  Musculoskeletal: Normal range of motion.  Neurological: She is alert and oriented to person, place, and time. She has normal strength. No cranial nerve deficit or sensory deficit. Coordination normal. GCS eye subscore is 4. GCS verbal subscore is 5. GCS motor subscore is 6.  Skin: Skin is warm, dry and intact. No rash noted. No cyanosis.  Psychiatric: She has a normal mood and affect. Her speech is  normal and behavior is normal. Thought content normal.  Nursing note and vitals reviewed.   ED Course  Procedures (including critical care time) Labs Review Labs Reviewed  COMPREHENSIVE METABOLIC PANEL - Abnormal; Notable for the following:    Total Protein 8.9 (*)    AST 84 (*)    ALT 93 (*)    All other components within normal limits  URINALYSIS, ROUTINE W REFLEX MICROSCOPIC (NOT AT [facility]) - Abnormal; Notable for the following:    APPearance CLOUDY (*)    pH 8.5 (*)    All other components within normal limits  C DIFFICILE QUICK SCREEN W PCR REFLEX  LIPASE, BLOOD  CBC    Imaging Review Dg Abd Acute W/chest  12/23/2014  CLINICAL DATA:  Nausea without vomiting, severe RIGHT-sided abdominal pain for 2-3 days, smoker quit 1 year ago, endometrial cancer, has not been taking hypertension medication EXAM: DG ABDOMEN ACUTE W/ 1V CHEST COMPARISON:  Chest radiograph 06/06/2014, CT abdomen/  pelvis 12/22/2014 FINDINGS: Upper normal heart size. Mediastinal contours and pulmonary vascularity normal. Linear subsegmental atelectasis at RIGHT middle lobe. Chronic peribronchial thickening. No pulmonary infiltrate, pleural effusion or pneumothorax. Retained contrast in descending and sigmoid colon. Normal bowel gas pattern. No bowel dilatation, bowel wall thickening, or free intraperitoneal air. Osseous structures unremarkable. IMPRESSION: No acute abdominal findings. Chronic bronchitic changes with subsegmental atelectasis RIGHT middle lobe Electronically Signed   By: Lavonia Dana M.D.   On: 12/23/2014 15:21   I have personally reviewed and evaluated these images and lab results as part of my medical decision-making.   EKG Interpretation None      MDM   Final diagnoses:  None   diarrhea  Patient presents to the ER for evaluation of weight loss and diarrhea. Patient was referred to the ER by her oncologist. She is being treated for endometrial cancer. She was recently diagnosed with  gallstones. Symptoms today do not seem consistent with cholecystitis. She does not have significant right upper quadrant tenderness or Murphy sign. AST and ALT are both very slightly elevated, normal alkaline phosphatase, normal total bilirubin. Lipase is normal. Blood counts are normal including white count of 6.9. She is afebrile. Patient reports that she is feeling very distended. Abdominal x-ray does not show any evidence of obstruction. Patient had CT scan of abdomen and pelvis 6 days ago that did not show any acute abnormality including no evidence of recurrent cancer. She does not require repeat CT scan. Ultrasound was performed to further evaluate gallbladder. Gallstones confirmed, no cholecystitis. Symptomatic treatment and outpatient follow up with surgery is being arranged by oncology.    Orpah Greek, MD 12/24/14 [phone]

## 2014-12-23 NOTE — ED Notes (Signed)
Pt is a hard stick - she would like to wait for IV.  Triage RN aware.

## 2014-12-23 NOTE — Discharge Instructions (Signed)

## 2015-01-06 ENCOUNTER — Other Ambulatory Visit: Payer: Self-pay | Admitting: Gastroenterology

## 2015-01-06 DIAGNOSIS — R1013 Epigastric pain: Secondary | ICD-10-CM

## 2015-01-11 ENCOUNTER — Ambulatory Visit
Admission: RE | Admit: 2015-01-11 | Discharge: 2015-01-11 | Disposition: A | Discharge status: Home / Self Care | Payer: No Typology Code available for payment source | Source: Ambulatory Visit | Attending: Gastroenterology | Admitting: Gastroenterology

## 2015-01-11 ENCOUNTER — Encounter (INDEPENDENT_AMBULATORY_CARE_PROVIDER_SITE_OTHER): Payer: Self-pay

## 2015-01-11 DIAGNOSIS — R1013 Epigastric pain: Secondary | ICD-10-CM

## 2015-01-13 ENCOUNTER — Ambulatory Visit (HOSPITAL_BASED_OUTPATIENT_CLINIC_OR_DEPARTMENT_OTHER): Payer: Self-pay

## 2015-01-13 ENCOUNTER — Other Ambulatory Visit (HOSPITAL_COMMUNITY)
Admission: RE | Admit: 2015-01-13 | Discharge: 2015-01-13 | Disposition: A | Discharge status: Home / Self Care | Payer: Self-pay | Source: Ambulatory Visit | Attending: Gynecologic Oncology | Admitting: Gynecologic Oncology

## 2015-01-13 ENCOUNTER — Encounter: Payer: Self-pay | Admitting: Gynecologic Oncology

## 2015-01-13 ENCOUNTER — Ambulatory Visit: Payer: No Typology Code available for payment source | Attending: Gynecologic Oncology | Admitting: Gynecologic Oncology

## 2015-01-13 VITALS — BP 131/68 | HR 72 | Temp 98.6°F | Resp 20 | Ht 66.0 in | Wt 274.0 lb

## 2015-01-13 DIAGNOSIS — C541 Malignant neoplasm of endometrium: Secondary | ICD-10-CM

## 2015-01-13 DIAGNOSIS — IMO0002 Reserved for concepts with insufficient information to code with codable children: Secondary | ICD-10-CM

## 2015-01-13 DIAGNOSIS — Z01411 Encounter for gynecological examination (general) (routine) with abnormal findings: Secondary | ICD-10-CM | POA: Insufficient documentation

## 2015-01-13 DIAGNOSIS — R896 Abnormal cytological findings in specimens from other organs, systems and tissues: Secondary | ICD-10-CM | POA: Insufficient documentation

## 2015-01-13 DIAGNOSIS — Z1151 Encounter for screening for human papillomavirus (HPV): Secondary | ICD-10-CM | POA: Insufficient documentation

## 2015-01-13 DIAGNOSIS — B977 Papillomavirus as the cause of diseases classified elsewhere: Secondary | ICD-10-CM | POA: Insufficient documentation

## 2015-01-13 DIAGNOSIS — Z23 Encounter for immunization: Secondary | ICD-10-CM

## 2015-01-13 DIAGNOSIS — K8070 Calculus of gallbladder and bile duct without cholecystitis without obstruction: Secondary | ICD-10-CM

## 2015-01-13 MED ORDER — INFLUENZA VAC SPLIT QUAD 0.5 ML IM SUSY
0.5000 mL | PREFILLED_SYRINGE | Freq: Once | INTRAMUSCULAR | Status: AC
  Administered 2015-01-13: 0.5 mL via INTRAMUSCULAR
  Filled 2015-01-13: qty 0.5

## 2015-01-13 NOTE — Progress Notes (Signed)
GYN ONCOLOGY OFFICE VISIT   CC:  Endometrial cancer surveillance, Abnormal pap +hrHPVDNA   Assessment/Plan:  [name]  is a 44 y.o.  year old status post laparoscopic hysterectomy bilateral salpingectomy by Dr. Harolyn Rutherford  for abnormal uterine bleeding with 2 prior endometrial sampling that were negative for malignancy. The specimen was morcellated into 4 pieces to facilitate removal from the vagina. 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 Based on the grade and lymphovascular space involvement is recommended that she undergo vaginal cuff brachytherapy.Treatment was completed May 21, 2013. Follow-up with Gyn Onc in 05/2015  Abnormal pap 12/2013 LSIL HPV+ colpo neg Repeat pap 05/2014 ASCUS hrHPV +  Per ASCCP colposcopy guidelines there isn't an indication for repeat pap for 12 months after colposcopy.   Pap HPV collected today.   If HPV + repeat pap again in 12 months.  Abdominal bloating malaise 12/2014 CT abd pelvis  Notable only for cholelithiasis Referred for evaluation of cholelithiasis Plan for cholecystectomy  Rectal bleeding Seen by GI.  Upper endoscopy negative.  Guaiac negative  Follow-up with Dr. Skeet Latch 05/2015  HPI: [name]  is a 44 y.o.G2 P2 last normal menstrual period in 11/03/2012. She reports abnormal uterine bleeding that began approximately 6 months ago. It became very heavy in September 2014 when she presented for evaluation and was initially placed on Megace.  12/01/12 ECC - BENIGN ENDOCERVICAL MUCOSA, - DETACHED FRAGMENTS OF SQUAMOUS EPITHELIUM; NEGATIVE FOR INTRAEPITHELIAL LESION OR MALIGANNCY. Endometrium, biopsy - FRAGMENTS OF ENDOMETRIOID-TYPE POLYP(S) WITH DECIDUALIZATION OF THE STROMA, CONSISTENT  WITH HORMONE EFFECT. - THERE IS NO EVIDENCE OF HYPERPLASIA OR MALIGNANCY. Repeat biopsy on 01/02/2013 Endometrium, curettage - FRAGMENTS SUGGESTIVE OF ENDOMETRIOID TYPE POLYP(S) WITH DECIDUALIZATION OF THE STROMA, CONSISTENT WITH HORMONE EFFECT. - BENIGN ENDOCERVICAL TYPE MUCOSA. - THERE IS NO EVIDENCE OF HYPERPLASIA OR MALIGNANCY.  On 01/22/2013 she underwent a total laparoscopic hysterectomy unilateral salpingectomy with "some  vaginal morcellation to deliver the uterus vaginally after the hysterectomy was completed"  Uterus and bilateral fallopian tubes, with cervix ( 4 sections) Specimen: Uterus, cervix, one fallopian tube Procedure: Hysterectomy and salpingectomy Lymph node sampling performed: No Specimen integrity: Fragmented, please see gross description for details. Maximum tumor size: Estimated size 6.5 cm, grossly Histologic type: Invasive endometrioid carcinoma Grade: FIGO Grade II Myometrial invasion: 0.9 cm where myometrium is 2.4 cm in thickness Cervical stromal involvement: No Lymph - vascular invasion: Present, extensively FIGO Stage (based on pathologic findings, needs clinical correlation): At least IA Comment:  Sections show an invasive endometrioid carcinoma involving the upper portion of the uterus, estimated 6.5 cm in greatest dimension. There is extensive angiolymphatic invasion present. IHC Expression Result: MLH1: Preserved nuclear expression (greater 50% tumor expression) MSH2: Preserved nuclear expression (greater 50% tumor expression) MSH6: Preserved nuclear expression (greater 50% tumor expression) PMS2: Preserved nuclear expression (greater 50% tumor expression) * Internal control demonstrates intact nuclear expression  Discussion with Dr. Harolyn Rutherford confirmed that there was vaginal not intra-abdominal morcellation and that both fallopian tubes were removed, although only one was in the pathology specimen.  CT 12/17/2015to assess for nodal or intraperioneal  disease notable for no retroperitoneal or retrocrural adenopathy. Normal colon, appendix, and terminal ileum. Normal small bowel without abdominal ascites. No evidence of omental  or peritoneal disease. No pelvic adenopathy. Residual normal appearing left  ovary on image 72 and right ovary on image 76. No adnexal mass. There is mild pelvic fascia thickening and trace fluid which is presumably postoperative.   On 03/31/13 she underwent: Robotic bilateral oophorectomy, left salpingectomy, bilateral pelvic lymph node dissection, right PA node sampling   Pathology: Bilateral ovaries, left fallopian tube, Right para-aortic lymph nodes, bilateral pelvic lymph nodes to pathology.   Operative findings: Status post hysterectomy with mild adhesive disease of the rectosigmoid colon to the left adnexa. Significant intra-abdominal obesity. Prominent bilateral pelvic lymph nodes.  Pathology revealed: Diagnosis 1. Ovary and fallopian tube, left - BENIGN OVARY WITH CYSTIC FOLLICLES. - SURFACE FOREIGN BODY GIANT CELL REACTION. - THERE IS NO EVIDENCE OF MALIGNANCY. - SEE COMMENT. 2. Ovary and fallopian tube, right - BENIGN OVARY WITH CYSTIC FOLLICLES. - THERE IS NO EVIDENCE OF MALIGNANCY. - SEE COMMENT. 3. Lymph nodes, regional resection, right pelvic - THERE IS NO EVIDENCE OF CARCINOMA IN 10 OF 10 LYMPH NODES (0/10). 4. Lymph nodes, regional resection, left pelvic - THERE IS NO EVIDENCE OF CARCINOMA IN 7 OF 7 LYMPH NODES (0/7). 5. Lymph nodes, regional resection, right para-aortic - THERE IS NO EVIDENCE OF CARCINOMA IN 1 OF 1 LYMPH NODE (0/1).  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   Given extensive LVSI adjuvant bracytherapy was recommended. Radiation treatment dates: February 19th, February 25, March 5, March 12, May 21, 2013  Site/dose: Proximal vagina 30 Gy in 5 fractions  Beams/energy: Patient was treated with intracavitary  brachytherapy treatments alone using iridium 192 as the high-dose-rate source. The patient was treated with a 3 cm diameter cylinder. Prescription was to the mucosal surface. Treatment length was 4 cm  Pap 12/27/2013 LSIL +hrHPV DNA Colposcopy 01/2014 Vagina, biopsy - BENIGN SQUAMOUS MUCOSA. - NO DYSPLASIA OR EVIDENCE OF MALIGNANCY.  Repeat pap 05/2014 ASCUCs HPV+ Reports interval psychiatric hospitalization. Mental heath is managed by Domenica Fail PA.   Has remained a non smoker.  At the 12/2014 visit   Angeliz Settlemyre Mukai reports three weeks of abdominal bloating, decreased appetite and nausea.  Nausea is not associated with eating.  CT 12/17/2014 MPRESSION: 1. Status post hysterectomy, with no evidence of local tumor recurrence. 2. No evidence of metastatic disease in the abdomen or pelvis. 3. No acute abnormality. No evidence of bowel obstruction or acute bowel inflammation. Normal appendix. 4. Severe diffuse hepatic steatosis with interval worsening. 5. Cholelithiasis, with no evidence of acute cholecystitis. No biliary ductal dilatation  Seen by GI.  Upper endoscopy negative.  Stool guaiac in the office was negative  Plan for cholecystectomy.    Social Hx:   Social History   Social History  . Marital Status: Divorced    Spouse Name: N/A  . Number of Children: N/A  . Years of Education: N/A   Occupational History  . Not on file.   Social History Main Topics  . Smoking status: Former Smoker -- 0.50 packs/day for 26 years    Types: Cigarettes    Quit date: 08/03/2013  . Smokeless tobacco: Never Used  . Alcohol Use: No  . Drug Use: No  . Sexual Activity: Not Currently    Birth Control/ Protection: Surgical   Other Topics Concern  . Not on file   Social History Narrative  Stopped smoking. Exercises daily and is on [employer].   Past Surgical Hx:  Past Surgical History  Procedure Laterality Date  . Cesarean section  X2  . Tubal ligation    . Dilitation &  currettage/hystroscopy with hydrothermal ablation N/A 01/01/2013    Procedure: DILATATION & CURETTAGE/HYSTEROSCOPY WITH ATTEMPTED HYDROTHERMAL ABLATION: Change over to [place] @ 1505;  Surgeon: Osborne Oman, MD;  Location: [facility];  Service: Gynecology;  Laterality: N/A;  . Laparoscopic assisted vaginal hysterectomy N/A 01/22/2013    Procedure: LAPAROSCOPIC ASSISTED VAGINAL HYSTERECTOMY with Bilateral Fallopian Tubes and Ovaries;  Surgeon: Osborne Oman, MD;  Location: [facility];  Service: Gynecology;  Laterality: N/A;  . Robotic assisted total hysterectomy with bilateral salpingo oopherectomy Bilateral 03/31/2013    Procedure: ROBOTIC ASSISTED BILATERAL OOPHORECTOMY PELVIC AND POSSIBLE PARA-AORTIC LYMPH NODE DISSECTION;  Surgeon: Imagene Gurney A. Alycia Rossetti, MD;  Location: [facility];  Service: Gynecology;  Laterality: Bilateral;    Past Medical Hx:  Past Medical History  Diagnosis Date  . Anemia   . Menorrhagia   . Depression   . Anxiety   . Headache(784.0)     MIGRAINES  . Complication of anesthesia     PT STATES SHE WOKE UP AFTER D&C [place].  STATES NO PROBLEMS WAKING UP AFTER HER HYSTERECTOMY  . History of radiation therapy 04/23/13, 04/29/13, 05/07/13, 05/14/13, 05/21/13    30 Gy to proximal vagina  . Migraines   . Neuropathy ([place])   . Cancer [facility])     ENDOMETRIAL CANCER (tx completed 05/2013)    Past Gynecological History: Gravida 2 per 2 menarche at age 25 with regular menses until 6 months ago. Reports oral contraceptive pill use for 14 years in the remote past Patient's last menstrual period was 11/03/2012. Denies h/o abnormal pap prior to hysterectomy.  Family Hx:  Family History  Problem Relation Age of Onset  . Diabetes Mother   . Hypertension Mother   . Cancer Neg Hx    ROS:  No cough no abdominal pain intermittent  abdominal pain with nausea .  Intermittent diarrhea, no further  hematochezia. Reports fair  appetite, Reports hydradenitis lesions  under both arms.  Otherwise 10 point ROS negative  Vitals:  Blood pressure 131/68, pulse 72, temperature 98.6 F (37 C), temperature source Oral, resp. rate 20, height '5\' 6"'  (1.676 m), weight 274 lb (124.286 kg), last menstrual period 11/03/2012, SpO2 99 %. Wt Readings from Last 3 Encounters:  01/13/15 274 lb (124.286 kg)  12/23/14 275 lb 8 oz (124.966 kg)  12/16/14 288 lb (130.636 kg)  Body mass index is 44.25 kg/(m^2).  Physical Exam: WD in an excellent mood LN:  No cervical supraclavicular or inguinal adenopathy Back:  No CVAT Pelvic:  Nl EGBUS no masses bleeding or discharge.  Atropic vagina  Pap collected. Psych:  Good mood and affect.

## 2015-01-13 NOTE — Addendum Note (Signed)
Addended by: [name] D on: 01/13/2015 04:58 PM   Modules accepted: Orders

## 2015-01-13 NOTE — Patient Instructions (Signed)
Follow up in March 2017 , call with any changes , questions or concerns.

## 2015-01-18 LAB — CYTOLOGY - PAP

## 2015-01-19 ENCOUNTER — Telehealth: Payer: Self-pay

## 2015-01-19 NOTE — Telephone Encounter (Signed)
Orders received to contact the patient to update with PAP results collected on 01/13/2015 during visit with Dr Janie Morning. PAP results Normal , HPV High Risk not detected . Patient contacted  , no answer , left a detailed message with call back information if additional questions or concerns arise.

## 2015-02-21 ENCOUNTER — Encounter: Payer: Self-pay | Admitting: *Deleted

## 2015-02-21 ENCOUNTER — Telehealth: Payer: Self-pay
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
## 2015-02-25 ENCOUNTER — Encounter: Payer: Self-pay | Admitting: *Deleted

## 2015-02-25 NOTE — Progress Notes (Signed)
[facility]  Clinical Social Work contacted patient by phone to follow from phone conversation earlier this week.  Patient stated she "had a good day Monday and it's been downhill and more feeling just okay this week".  Ms. Ensz reported she has been surrounded by her family "24/7".  She plans to go to [facility] on Monday for inpatient hospitalization when she returns home from visiting with her family.  CSW encouraged plan- patient still experiencing high level of anxiety, patient shared she does not feel safe alone at home and feels she her anxiety will rise when she returns home.  Polo Riley, MSW, LCSW, OSW-C Clinical Social Worker [facility] [phone]

## 2015-02-28 ENCOUNTER — Encounter (HOSPITAL_COMMUNITY): Payer: Self-pay | Admitting: Emergency Medicine

## 2015-02-28 ENCOUNTER — Emergency Department (HOSPITAL_COMMUNITY)
Admission: EM | Admit: 2015-02-28 | Discharge: 2015-03-01 | Disposition: A | Discharge status: Other Acute Inpatient Hospital | Payer: 59 | Attending: Emergency Medicine | Admitting: Emergency Medicine

## 2015-02-28 DIAGNOSIS — Z862 Personal history of diseases of the blood and blood-forming organs and certain disorders involving the immune mechanism: Secondary | ICD-10-CM | POA: Insufficient documentation

## 2015-02-28 DIAGNOSIS — G43909 Migraine, unspecified, not intractable, without status migrainosus: Secondary | ICD-10-CM | POA: Insufficient documentation

## 2015-02-28 DIAGNOSIS — F332 Major depressive disorder, recurrent severe without psychotic features: Secondary | ICD-10-CM | POA: Diagnosis present

## 2015-02-28 DIAGNOSIS — R45850 Homicidal ideations: Secondary | ICD-10-CM | POA: Insufficient documentation

## 2015-02-28 DIAGNOSIS — Z8742 Personal history of other diseases of the female genital tract: Secondary | ICD-10-CM | POA: Insufficient documentation

## 2015-02-28 DIAGNOSIS — G479 Sleep disorder, unspecified: Secondary | ICD-10-CM | POA: Insufficient documentation

## 2015-02-28 DIAGNOSIS — G629 Polyneuropathy, unspecified: Secondary | ICD-10-CM | POA: Insufficient documentation

## 2015-02-28 DIAGNOSIS — Z79899 Other long term (current) drug therapy: Secondary | ICD-10-CM | POA: Insufficient documentation

## 2015-02-28 DIAGNOSIS — F329 Major depressive disorder, single episode, unspecified: Secondary | ICD-10-CM | POA: Insufficient documentation

## 2015-02-28 DIAGNOSIS — Z8542 Personal history of malignant neoplasm of other parts of uterus: Secondary | ICD-10-CM | POA: Insufficient documentation

## 2015-02-28 DIAGNOSIS — F419 Anxiety disorder, unspecified: Secondary | ICD-10-CM | POA: Insufficient documentation

## 2015-02-28 DIAGNOSIS — Z87891 Personal history of nicotine dependence: Secondary | ICD-10-CM | POA: Insufficient documentation

## 2015-02-28 DIAGNOSIS — F32A Depression, unspecified: Secondary | ICD-10-CM

## 2015-02-28 DIAGNOSIS — Z792 Long term (current) use of antibiotics: Secondary | ICD-10-CM | POA: Insufficient documentation

## 2015-02-28 LAB — RAPID URINE DRUG SCREEN, HOSP PERFORMED
AMPHETAMINES: NOT DETECTED
Barbiturates: NOT DETECTED
Benzodiazepines: NOT DETECTED
COCAINE: NOT DETECTED
Opiates: NOT DETECTED
Tetrahydrocannabinol: NOT DETECTED

## 2015-02-28 LAB — COMPREHENSIVE METABOLIC PANEL
ALBUMIN: 4 g/dL (ref 3.5–5.0)
ALK PHOS: 90 U/L (ref 38–126)
ALT: 47 U/L (ref 14–54)
ANION GAP: 13 (ref 5–15)
AST: 43 U/L — ABNORMAL HIGH (ref 15–41)
BUN: 18 mg/dL (ref 6–20)
CALCIUM: 9.3 mg/dL (ref 8.9–10.3)
CHLORIDE: 105 mmol/L (ref 101–111)
CO2: 21 mmol/L — AB (ref 22–32)
CREATININE: 0.69 mg/dL (ref 0.44–1.00)
GFR calc Af Amer: >60 mL/min (ref >60)
GFR calc non Af Amer: >60 mL/min (ref >60)
GLUCOSE: 162 mg/dL — AB (ref 65–99)
Potassium: 4.1 mmol/L (ref 3.5–5.1)
SODIUM: 139 mmol/L (ref 135–145)
Total Bilirubin: 0.4 mg/dL (ref 0.3–1.2)
Total Protein: 8.2 g/dL — ABNORMAL HIGH (ref 6.5–8.1)

## 2015-02-28 LAB — ETHANOL: Alcohol, Ethyl (B): <5 mg/dL (ref <5)

## 2015-02-28 LAB — CBC
HEMATOCRIT: 45 % (ref 36.0–46.0)
HEMOGLOBIN: 14.4 g/dL (ref 12.0–15.0)
MCH: 28.5 pg (ref 26.0–34.0)
MCHC: 32 g/dL (ref 30.0–36.0)
MCV: 88.9 fL (ref 78.0–100.0)
Platelets: 395 10*3/uL (ref 150–400)
RBC: 5.06 MIL/uL (ref 3.87–5.11)
RDW: 13.1 % (ref 11.5–15.5)
WBC: 9.1 10*3/uL (ref 4.0–10.5)

## 2015-02-28 LAB — ACETAMINOPHEN LEVEL: Acetaminophen (Tylenol), Serum: <10 ug/mL — ABNORMAL LOW (ref 10–30)

## 2015-02-28 LAB — SALICYLATE LEVEL: Salicylate Lvl: <4 mg/dL (ref 2.8–30.0)

## 2015-02-28 MED ORDER — LORAZEPAM 1 MG PO TABS: 1.0000 mg | ORAL_TABLET | Freq: Three times a day (TID) | ORAL | Status: DC | PRN

## 2015-02-28 MED ORDER — LORATADINE 10 MG PO TABS
10.0000 mg | ORAL_TABLET | Freq: Every day | ORAL | Status: DC
  Administered 2015-02-28 – 2015-03-01 (×2): 10 mg via ORAL
  Filled 2015-02-28 (×2): qty 1

## 2015-02-28 MED ORDER — ONDANSETRON HCL 4 MG PO TABS: 4.0000 mg | ORAL_TABLET | Freq: Three times a day (TID) | ORAL | Status: DC | PRN

## 2015-02-28 MED ORDER — IBUPROFEN 200 MG PO TABS
600.0000 mg | ORAL_TABLET | Freq: Three times a day (TID) | ORAL | Status: DC | PRN
  Administered 2015-03-01: 600 mg via ORAL

## 2015-02-28 MED ORDER — TOPIRAMATE 25 MG PO TABS
50.0000 mg | ORAL_TABLET | Freq: Two times a day (BID) | ORAL | Status: DC
  Administered 2015-02-28 – 2015-03-01 (×2): 50 mg via ORAL
  Filled 2015-02-28 (×2): qty 2

## 2015-02-28 MED ORDER — VENLAFAXINE HCL 75 MG PO TABS
75.0000 mg | ORAL_TABLET | Freq: Two times a day (BID) | ORAL | Status: DC
  Administered 2015-02-28 – 2015-03-01 (×2): 75 mg via ORAL
  Filled 2015-02-28 (×5): qty 1

## 2015-02-28 MED ORDER — DULOXETINE HCL 60 MG PO CPEP
60.0000 mg | ORAL_CAPSULE | Freq: Every day | ORAL | Status: DC
  Administered 2015-03-01: 60 mg via ORAL
  Filled 2015-02-28: qty 1

## 2015-02-28 MED ORDER — PANTOPRAZOLE SODIUM 40 MG PO TBEC
40.0000 mg | DELAYED_RELEASE_TABLET | Freq: Every day | ORAL | Status: DC
  Administered 2015-02-28 – 2015-03-01 (×2): 40 mg via ORAL
  Filled 2015-02-28 (×2): qty 1

## 2015-02-28 MED ORDER — ACETAMINOPHEN 325 MG PO TABS: 650.0000 mg | ORAL_TABLET | ORAL | Status: DC | PRN

## 2015-02-28 MED ORDER — CLONIDINE HCL 0.1 MG PO TABS
0.1000 mg | ORAL_TABLET | Freq: Every day | ORAL | Status: DC
  Administered 2015-02-28: 0.1 mg via ORAL
  Filled 2015-02-28: qty 1

## 2015-02-28 MED ORDER — CLONIDINE HCL 0.1 MG PO TABS: 0.1000 mg | ORAL_TABLET | Freq: Every evening | ORAL | Status: DC

## 2015-02-28 NOTE — ED Notes (Signed)
Pt AAO x 3, resting at present, no distress noted, calm & cooperative, monitoring for safety, Q 15 min checks in effect.
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
## 2015-03-01 ENCOUNTER — Inpatient Hospital Stay (HOSPITAL_COMMUNITY)
Admission: AD | Admit: 2015-03-01 | Discharge: 2015-03-07 | DRG: 885 | Disposition: A | Discharge status: Home / Self Care | Payer: Federal, State, Local not specified - Other | Source: Intra-hospital | Attending: Psychiatry | Admitting: Psychiatry

## 2015-03-01 DIAGNOSIS — F332 Major depressive disorder, recurrent severe without psychotic features: Secondary | ICD-10-CM | POA: Diagnosis present

## 2015-03-01 DIAGNOSIS — Z8249 Family history of ischemic heart disease and other diseases of the circulatory system: Secondary | ICD-10-CM

## 2015-03-01 DIAGNOSIS — G4700 Insomnia, unspecified: Secondary | ICD-10-CM | POA: Diagnosis present

## 2015-03-01 DIAGNOSIS — Z923 Personal history of irradiation: Secondary | ICD-10-CM

## 2015-03-01 DIAGNOSIS — Z833 Family history of diabetes mellitus: Secondary | ICD-10-CM

## 2015-03-01 DIAGNOSIS — F410 Panic disorder [episodic paroxysmal anxiety] without agoraphobia: Secondary | ICD-10-CM | POA: Diagnosis present

## 2015-03-01 DIAGNOSIS — Z8542 Personal history of malignant neoplasm of other parts of uterus: Secondary | ICD-10-CM

## 2015-03-01 DIAGNOSIS — K219 Gastro-esophageal reflux disease without esophagitis: Secondary | ICD-10-CM | POA: Diagnosis present

## 2015-03-01 DIAGNOSIS — Z87891 Personal history of nicotine dependence: Secondary | ICD-10-CM

## 2015-03-01 MED ORDER — ALUM & MAG HYDROXIDE-SIMETH 200-200-20 MG/5ML PO SUSP: 30.0000 mL | ORAL | Status: DC | PRN

## 2015-03-01 MED ORDER — DULOXETINE HCL 60 MG PO CPEP: 120.0000 mg | ORAL_CAPSULE | Freq: Every day | ORAL | Status: DC

## 2015-03-01 MED ORDER — ONDANSETRON HCL 4 MG PO TABS: 4.0000 mg | ORAL_TABLET | Freq: Three times a day (TID) | ORAL | Status: DC | PRN

## 2015-03-01 MED ORDER — PANTOPRAZOLE SODIUM 40 MG PO TBEC
40.0000 mg | DELAYED_RELEASE_TABLET | Freq: Every day | ORAL | Status: DC
  Administered 2015-03-02 – 2015-03-07 (×6): 40 mg via ORAL
  Filled 2015-03-01 (×8): qty 1

## 2015-03-01 MED ORDER — CLONIDINE HCL 0.1 MG PO TABS
0.1000 mg | ORAL_TABLET | Freq: Every day | ORAL | Status: DC
  Administered 2015-03-01 – 2015-03-03 (×3): 0.1 mg via ORAL
  Filled 2015-03-01 (×6): qty 1

## 2015-03-01 MED ORDER — ACETAMINOPHEN 325 MG PO TABS
650.0000 mg | ORAL_TABLET | Freq: Four times a day (QID) | ORAL | Status: DC | PRN
Start: 2015-03-01 — End: 2015-03-01

## 2015-03-01 MED ORDER — TOPIRAMATE 25 MG PO TABS
50.0000 mg | ORAL_TABLET | Freq: Two times a day (BID) | ORAL | Status: DC
  Administered 2015-03-01 – 2015-03-07 (×12): 50 mg via ORAL
  Filled 2015-03-01: qty 2
  Filled 2015-03-01: qty 28
  Filled 2015-03-01 (×3): qty 2
  Filled 2015-03-01: qty 28
  Filled 2015-03-01 (×6): qty 2
  Filled 2015-03-01: qty 28
  Filled 2015-03-01 (×4): qty 2
  Filled 2015-03-01: qty 28
  Filled 2015-03-01: qty 2

## 2015-03-01 MED ORDER — ACETAMINOPHEN 325 MG PO TABS
650.0000 mg | ORAL_TABLET | ORAL | Status: DC | PRN
Start: 2015-03-01 — End: 2015-03-07
  Filled 2015-03-01: qty 2

## 2015-03-01 MED ORDER — IBUPROFEN 600 MG PO TABS
600.0000 mg | ORAL_TABLET | Freq: Three times a day (TID) | ORAL | Status: DC | PRN
  Administered 2015-03-02 (×2): 600 mg via ORAL
  Filled 2015-03-01 (×2): qty 1

## 2015-03-01 MED ORDER — MAGNESIUM HYDROXIDE 400 MG/5ML PO SUSP: 30.0000 mL | Freq: Every day | ORAL | Status: DC | PRN

## 2015-03-01 MED ORDER — LORATADINE 10 MG PO TABS
10.0000 mg | ORAL_TABLET | Freq: Every day | ORAL | Status: DC
  Administered 2015-03-02 – 2015-03-07 (×6): 10 mg via ORAL
  Filled 2015-03-01 (×8): qty 1

## 2015-03-01 MED ORDER — DULOXETINE HCL 60 MG PO CPEP
120.0000 mg | ORAL_CAPSULE | Freq: Every day | ORAL | Status: DC
  Administered 2015-03-02: 120 mg via ORAL
  Filled 2015-03-01 (×3): qty 2

## 2015-03-01 MED ORDER — VENLAFAXINE HCL 75 MG PO TABS
75.0000 mg | ORAL_TABLET | Freq: Every day | ORAL | Status: DC
  Filled 2015-03-01 (×3): qty 1

## 2015-03-01 MED ORDER — VENLAFAXINE HCL 75 MG PO TABS: 75.0000 mg | ORAL_TABLET | Freq: Every day | ORAL | Status: DC
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
## 2015-03-01 NOTE — Plan of Care (Signed)
Problem: Alteration in mood; excessive anxiety as evidenced by: Goal: STG-Pt will report an absence of self-harm thoughts/actions (Patient will report an absence of self-harm thoughts or actions)  Outcome: Progressing Pt denies SI, contract to come to staff if feeling unsafe.

## 2015-03-01 NOTE — Progress Notes (Signed)
Patient accepted to [facility], room 401-1. Clayborne Dana, RN
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
## 2015-03-01 NOTE — Progress Notes (Signed)
Adult Psychoeducational Group Note  Date:  03/01/2015 Time:  11:26 PM  Group Topic/Focus:  Wrap-Up Group:   The focus of this group is to help patients review their daily goal of treatment and discuss progress on daily workbooks.  Participation Level:  Active  Participation Quality:  Appropriate and Attentive  Affect:  Depressed  Cognitive:  Alert, Appropriate and Oriented  Insight: Appropriate  Engagement in Group:  Engaged  Modes of Intervention:  Discussion and Education  Additional Comments:  Pt attended and participated in group.  Pt arrived this evening on the unit and did not have a goal but did state that she is happy to be out of [employer].  Pt rated her day a 3/10 with 10 being the best.   Milus Glazier 03/01/2015, 11:26 PM

## 2015-03-01 NOTE — Tx Team (Addendum)
Initial Interdisciplinary Treatment Plan   PATIENT STRESSORS: Financial difficulties Health problems Marital or family conflict Traumatic event   PATIENT STRENGTHS: Motivation for treatment/growth Communication skills  PROBLEM LIST: Problem List/Patient Goals Date to be addressed Date deferred Reason deferred Estimated date of resolution  depression 03/01/15   DC  anxiety                                                 DISCHARGE CRITERIA:  Ability to meet basic life and health needs Improved stabilization in mood, thinking, and/or behavior Verbal commitment to aftercare and medication compliance  PRELIMINARY DISCHARGE PLAN: Outpatient therapy Return to previous living arrangement  PATIENT/FAMIILY INVOLVEMENT: This treatment plan has been presented to and reviewed with the patient, Sheryl Mathis, and/or family member,pt..  The patient and family have been given the opportunity to ask questions and make suggestions.  Oretha Milch 03/01/2015, 6:04 PM

## 2015-03-01 NOTE — BH Assessment (Signed)
[place] Assessment Progress Note  Per Corena Pilgrim, MD, this pt requires psychiatric hospitalization at this time.  Clayborne Dana, RN, [facility] has assigned pt to [facility] Rm 401-1, however they are not yet ready to receive pt.  She will call when ready.  Pt has signed Voluntary Admission and Consent for Treatment, as well as Consent to Release Information to her mother, Yehuda Savannah, and signed forms have been faxed to [facility].  Pt's nurse, Diane, has been notified, and agrees to send original paperwork along with pt via [facility], and to call report to [phone].  Jalene Mullet, [place] Triage Specialist [phone]

## 2015-03-01 NOTE — Progress Notes (Signed)
Patient ID: Sheryl Mathis, female   DOB: 04/07/1970, 44 y.o.   MRN: [id number] D:Pt cooperative with assessment. mood and affect appeared depressed and flat. Pt thought process is organized and behavior is appropriate. Pt denies SI/HI/AVH. Pt attended     evening wrap up group and engaged in discussion.    A: Met with pt 1:1. Medications administered as prescribed. Writer encouraged pt to discuss feelings. Pt encouraged to come to staff with any questions or concerns.   R: Patient is safe on the unit.

## 2015-03-02 ENCOUNTER — Encounter (HOSPITAL_COMMUNITY): Payer: Self-pay | Admitting: General Practice

## 2015-03-02 DIAGNOSIS — F332 Major depressive disorder, recurrent severe without psychotic features: Principal | ICD-10-CM

## 2015-03-02 MED ORDER — IBUPROFEN 600 MG PO TABS
600.0000 mg | ORAL_TABLET | Freq: Three times a day (TID) | ORAL | Status: DC | PRN
Start: 2015-03-02 — End: 2015-03-07
  Administered 2015-03-04 – 2015-03-06 (×2): 600 mg via ORAL
  Filled 2015-03-02 (×2): qty 1

## 2015-03-02 MED ORDER — HYDROXYZINE HCL 25 MG PO TABS
25.0000 mg | ORAL_TABLET | Freq: Four times a day (QID) | ORAL | Status: DC | PRN
  Administered 2015-03-02 – 2015-03-06 (×4): 25 mg via ORAL
  Filled 2015-03-02 (×4): qty 1
  Filled 2015-03-02: qty 10

## 2015-03-02 MED ORDER — DULOXETINE HCL 60 MG PO CPEP
60.0000 mg | ORAL_CAPSULE | Freq: Two times a day (BID) | ORAL | Status: DC
  Administered 2015-03-03 – 2015-03-07 (×9): 60 mg via ORAL
  Filled 2015-03-02 (×4): qty 1
  Filled 2015-03-02: qty 14
  Filled 2015-03-02 (×5): qty 1
  Filled 2015-03-02: qty 14
  Filled 2015-03-02: qty 1
  Filled 2015-03-02: qty 14
  Filled 2015-03-02: qty 1
  Filled 2015-03-02: qty 14
  Filled 2015-03-02 (×2): qty 1

## 2015-03-02 MED ORDER — DULOXETINE HCL 60 MG PO CPEP: 60.0000 mg | ORAL_CAPSULE | Freq: Two times a day (BID) | ORAL | Status: DC
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
## 2015-03-02 NOTE — BHH Group Notes (Signed)
[place] LCSW Group Therapy 03/02/2015 1:15 PM  Type of Therapy: Group Therapy- Emotion Regulation  Participation Level: Minimal  Participation Quality:  Reserved  Affect: Flat  Cognitive: Alert and Oriented   Insight:  Developing/Improving  Engagement in Therapy: Developing/Improving and Engaged   Modes of Intervention: Clarification, Confrontation, Discussion, Education, Exploration, Limit-setting, Orientation, Problem-solving, Rapport Building, Art therapist, Socialization and Support  Summary of Progress/Problems: The topic for group today was emotional regulation. This group focused on both positive and negative emotion identification and allowed group members to process ways to identify feelings, regulate negative emotions, and find healthy ways to manage internal/external emotions. Group members were asked to reflect on a time when their reaction to an emotion led to a negative outcome and explored how alternative responses using emotion regulation would have benefited them. Group members were also asked to discuss a time when emotion regulation was utilized when a negative emotion was experienced. Pt participated only when prompted. Pt identified that "getting away" is helpful when she experiences stress and anger. Pt identified "lashing out" as a negative consequence of unregulated emotions. She expressed that she needed to learn better coping skills in order to regulate her emotions more effectively.   [name], Start 03/02/2015 4:37 PM
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
## 2015-03-02 NOTE — Tx Team (Signed)
Interdisciplinary Treatment Plan Update (Adult) Date: 03/02/2015   Date: 03/02/2015 1:30 PM  Progress in Treatment:  Attending groups: Yes  Participating in groups: Yes  Taking medication as prescribed: Yes  Tolerating medication: Yes  Family/Significant othe contact made: No, CSW attempting to make contact with mother Patient understands diagnosis: Yes Discussing patient identified problems/goals with staff: Yes  Medical problems stabilized or resolved: Yes  Denies suicidal/homicidal ideation: Yes Patient has not harmed self or Others: Yes   New problem(s) identified: None identified at this time.   Discharge Plan or Barriers: Pt will return home and follow-up with [facility]  Additional comments:  Patient and CSW reviewed pt's identified goals and treatment plan. Patient verbalized understanding and agreed to treatment plan. CSW reviewed [facility] "Discharge Process and Patient Involvement" Form. Pt verbalized understanding of information provided and signed form.   Reason for Continuation of Hospitalization:  Anxiety Depression Medication stabilization Suicidal ideation   Estimated length of stay: 3-5 days  Review of initial/current patient goals per problem list:   1.  Goal(s): Patient will participate in aftercare plan  Met:  Yes  Target date: 3-5 days from date of admission   As evidenced by: Patient will participate within aftercare plan AEB aftercare provider and housing plan at discharge being identified.   03/02/15: Pt will return home and follow-up with [facility]  2.  Goal (s): Patient will exhibit decreased depressive symptoms and suicidal ideations.  Met:  No  Target date: 3-5 days from date of admission   As evidenced by: Patient will utilize self rating of depression at 3 or below and demonstrate decreased signs of depression or be deemed stable for discharge by MD.  03/02/15: Pt rates depression at 10/10; denies SI. Observed to be tearful  3.  Goal(s):  Patient will demonstrate decreased signs and symptoms of anxiety.  Met:  Progressing  Target date: 3-5 days from date of admission   As evidenced by: Patient will utilize self rating of anxiety at 3 or below and demonstrated decreased signs of anxiety, or be deemed stable for discharge by MD  03/02/15: Pt rates anxiety at 5/10.  Attendees:  Patient:    Family:    Physician: Dr. Parke Poisson, MD  03/02/2015 1:30 PM  Nursing: Lars Pinks, RN Case manager  03/02/2015 1:30 PM  Clinical Social Worker [name], LCSWA 03/02/2015 1:30 PM  Other: Erasmo Downer Drinkard, LCSWA 03/02/2015 1:30 PM  Clinical:  Gaylan Gerold, RN 03/02/2015 1:30 PM  Other: , RN Charge Nurse 03/02/2015 1:30 PM  Other: Hilda Lias, [place], [place] Social Work [phone]

## 2015-03-02 NOTE — Progress Notes (Signed)
Patient ID: Sheryl Mathis, female   DOB: 03-Nov-1970, 44 y.o.   MRN: [id number]  DAR: Pt. Denies SI/HI and A/V Hallucinations. She reports sleep is fair, appetite is fair, energy level is low, and concentration is poor. She rates depression and hopelessness 10/10 and anxiety 5/10. Patient does report a headache throughout the day and Ibuprofen has provided relief. Support and encouragement provided to the patient. Scheduled medications administered to patient per physician's orders. Patient is receptive and cooperative. She is interacting with peers and is attending groups. She reports her goal was to open up about her depression. Writer and patient spoke 1:1 about this and Probation officer provided encouragement. Q15 minute checks are maintained for safety.

## 2015-03-02 NOTE — Plan of Care (Signed)
Problem: Alteration in mood Goal: STG-Patient reports thoughts of self-harm to staff Outcome: Progressing Sheryl Mathis denies SI and thoughts of self harm to Probation officer during shift assessment

## 2015-03-02 NOTE — BHH Group Notes (Signed)
[facility] LCSW Aftercare Discharge Planning Group Note  03/02/2015 8:45 AM  Participation Quality: Alert, Appropriate and Oriented  Mood/Affect: Flat  Depression Rating: 10  Anxiety Rating: 5  Thoughts of Suicide: Pt denies SI/HI  Will you contract for safety? Yes  Current AVH: Pt denies  Plan for Discharge/Comments: Pt attended discharge planning group and actively participated in group. CSW discussed suicide prevention education with the group and encouraged them to discuss discharge planning and any relevant barriers. Pt presents with flat affect and participates minimally in group discussion. She reports that she has no aftercare providers.  Transportation Means: Pt reports access to transportation  Supports: No supports mentioned at this time  [name], [place] 03/02/2015 9:45 AM

## 2015-03-03 LAB — TSH: TSH: 1.972 u[IU]/mL (ref 0.350–4.500)

## 2015-03-03 NOTE — BHH Suicide Risk Assessment (Signed)
[place] INPATIENT:  Family/Significant Other Suicide Prevention Education  Suicide Prevention Education:  Education Completed: via phone with patient's mother figure, Sheryl Mathis, has been identified by the patient as the family member/significant other with whom the patient will be residing, and identified as the person(s) who will aid the patient in the event of a mental health crisis (suicidal ideations/suicide attempt).  With written consent from the patient, the family member/significant other has been provided the following suicide prevention education, prior to the and/or following the discharge of the patient.  The suicide prevention education provided includes the following:  Suicide risk factors  Suicide prevention and interventions  National Suicide Hotline telephone number  [facility] assessment telephone number  [facility] Emergency Assistance [place] and/or Residential Mobile Crisis Unit telephone number  Request made of family/significant other to:  Remove weapons (e.g., guns, rifles, knives), all items previously/currently identified as safety concern.    Remove drugs/medications (over-the-counter, prescriptions, illicit drugs), all items previously/currently identified as a safety concern.  The family member/significant other verbalizes understanding of the suicide prevention education information provided.  The family member/significant other agrees to remove the items of safety concern listed above.  Antony Haste 03/03/2015, 11:41 AM

## 2015-03-03 NOTE — Progress Notes (Signed)
Patient ID: Sheryl Mathis, female   DOB: 08-22-1970, 44 y.o.   MRN: [id number] Adult Psychoeducational Group Note  Date:  03/03/2015 Time:  09:00am  Group Topic/Focus:  Self Care:   The focus of this group is to help patients understand the importance of self-care in order to improve or restore emotional, physical, spiritual, interpersonal, and financial health.  Participation Level:  Active  Participation Quality:  Attentive  Affect:  Appropriate  Cognitive:  Appropriate  Insight: Improving  Engagement in Group:  Engaged  Modes of Intervention:  Activity, Discussion, Education and Support  Additional Comments:  Pt able to identify at least one relaxation technique to utilize post discharge from [facility].   Elenore Rota 03/03/2015, 11:37 AM

## 2015-03-03 NOTE — Progress Notes (Signed)
Adult Psychoeducational Group Note  Date:  03/03/2015 Time:  12:28 AM  Group Topic/Focus:  Wrap-Up Group:   The focus of this group is to help patients review their daily goal of treatment and discuss progress on daily workbooks.  Participation Level:  Active  Participation Quality:  Appropriate  Affect:  Appropriate and Flat  Cognitive:  Alert  Insight: Appropriate  Engagement in Group:  Engaged  Modes of Intervention:  Discussion  Additional Comments:  Pt was appropriate but somewhat flat during group. Pt rated day a 5, saying she was "up and down." Something positive was talking to the doctor and having visitors, which was "uplifting." Pt goal is to not cry as much.   Bernardo Heater 03/03/2015, 12:28 AM

## 2015-03-03 NOTE — Progress Notes (Signed)
Adult Psychoeducational Group Note  Date:  03/03/2015 Time:  10:08 PM  Group Topic/Focus:  Wrap-Up Group:   The focus of this group is to help patients review their daily goal of treatment and discuss progress on daily workbooks.  Participation Level:  Active  Participation Quality:  Appropriate  Affect:  Appropriate  Cognitive:  Alert  Insight: Appropriate  Engagement in Group:  Engaged  Modes of Intervention:  Discussion  Additional Comments:  Patient stated her goal for today was to get more rest. Patient stated something positive that happened today was that she is on the right medication.   Vijay Durflinger L Hanley Woerner 03/03/2015, 10:08 PM

## 2015-03-03 NOTE — Progress Notes (Signed)
Patient ID: [name], female   DOB: [birth year], 44 y.o.   MRN: [id number]  Pt currently presents with a blunted affect and anxious behavior. Per self inventory, pt rates depression at a 8, hopelessness 5 and anxiety 5. Pt's daily goal is to "interaction with others" and they intend to do so by "open up." Pt reports good sleep, a fair appetite, low energy and good concentration. Pt also writes "Concerned about highs and lows or is it a part of depression."   Pt provided with medications per providers orders. Pt's labs and vitals were monitored throughout the day. Pt supported emotionally and encouraged to express concerns and questions. Pt educated on medications and assertiveness techniques. Pt active part of the milieu and attends all groups today.   Pt's safety ensured with 15 minute and environmental checks. Pt currently denies SI/HI and A/V hallucinations. Pt verbally agrees to seek staff if SI/HI or A/VH occurs and to consult with staff before acting on these thoughts. Will continue POC.

## 2015-03-03 NOTE — BHH Group Notes (Signed)
[facility] Mental [facility] Group Therapy 03/03/2015 1:15pm  Type of Therapy: Mental Health Association Presentation  Participation Level: Active  Participation Quality: Attentive  Affect: Appropriate  Cognitive: Oriented  Insight: Developing/Improving  Engagement in Therapy: Engaged  Modes of Intervention: Discussion, Education and Socialization  Summary of Progress/Problems: Mental Health Association ([place]) Speaker came to talk about his personal journey with substance abuse and addiction. The pt processed ways by which to relate to the speaker. [place] speaker provided handouts and educational information pertaining to groups and services offered by the [facility]. Pt was engaged in speaker's presentation and was receptive to resources provided.    Rigoberto Noel, MSW, LCSW Clinical Social Worker
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
## 2015-03-04 LAB — HEMOGLOBIN A1C
Hgb A1c MFr Bld: 6.4 % — ABNORMAL HIGH (ref 4.8–5.6)
Mean Plasma Glucose: 137 mg/dL

## 2015-03-04 MED ORDER — CLONIDINE HCL 0.1 MG PO TABS
0.0500 mg | ORAL_TABLET | Freq: Every day | ORAL | Status: DC
  Administered 2015-03-04 – 2015-03-07 (×4): 0.05 mg via ORAL
  Filled 2015-03-04 (×5): qty 0.5
  Filled 2015-03-04: qty 1
  Filled 2015-03-04: qty 0.5

## 2015-03-04 NOTE — Progress Notes (Signed)
D: Daylen states her goal today was to stay focused and positive. She attended group this evening. She is more conversant today. Smiling and laughing during our conversation. She was told by her godmother she will have a job waiting for her once she is ready to begin working again. So she was very pleased about that. She rates Anxiety 0/10 Depression 5/10 and Hopelessness 3/10. She denies SI/HI/AVH at this time and contracts for safety.  A: Q 15 minute checks for patient safety. Encouragement and support given. Medications administered as prescribed.  R: Continue to monitor for patient safety and medication effectiveness.

## 2015-03-04 NOTE — BHH Group Notes (Signed)
  Pt presents with an anxious affect and behavior. Per self inventory, pt rates depression at a 5, hopelessness 3 and anxiety 5. Pt's daily goal is to "staying focused" and they intend to do so by "setting baby steps to recovery." Pt reports good sleep, a good appetite, low energy and poor concentration. Pt becomes anxious and fixated on numerous medical concerns throughout the day.  Pt provided with medications per providers orders. Pt's labs and vitals were monitored throughout the day. Pt supported emotionally and encouraged to express concerns and questions. Pt educated on medications. Pt's safety ensured with 15 minute and environmental checks. Pt currently denies SI/HI and A/V hallucinations. Pt verbally agrees to seek staff if SI/HI or A/VH occurs and to consult with staff before acting on these thoughts. Will continue POC.

## 2015-03-04 NOTE — Progress Notes (Signed)
Adult Psychoeducational Group Note  Date:  03/04/2015 Time:  8:55 PM  Group Topic/Focus:  Wrap-Up Group:   The focus of this group is to help patients review their daily goal of treatment and discuss progress on daily workbooks.  Participation Level:  Active  Participation Quality:  Appropriate  Affect:  Appropriate  Cognitive:  Alert  Insight: Appropriate  Engagement in Group:  Engaged  Modes of Intervention:  Discussion  Additional Comments:  Patient stated having a good day. Patient stated having issues with her blood pressure, but is getting it under control. Patient also stated that she slept well last night.   Samer Dutton L Bailyn Spackman 03/04/2015, 8:55 PM

## 2015-03-04 NOTE — BHH Group Notes (Signed)
[place] LCSW Group Therapy  03/04/2015 2:26 PM  Type of Therapy: Group Therapy- Feelings Around Relapse and Recovery  Participation Level: Active   Participation Quality: Appropriate  Affect: Appropriate  Cognitive: Alert and Oriented   Insight: Developing   Engagement in Therapy: Developing/Improving and Engaged   Modes of Intervention: Clarification, Confrontation, Discussion, Education, Exploration, Limit-setting, Orientation, Problem-solving, Rapport Building, Art therapist, Socialization and Support  Summary of Progress/Problems: The topic for today was feelings about relapse. The group discussed what relapse prevention is to them and identified triggers that they are on the path to relapse. Members also processed their feeling towards relapse and were able to relate to common experiences. Group also discussed coping skills that can be used for relapse prevention. La was observed to be attentive in group as she discussed her trigger to relapse. She reported having issues with her medications and stated that she was seen by several different outpatient PAs who were unable to resolve her issues with medications. She ended group stating her desire to decrease the likelihood of future relapse by working on herself and "feeling better".    Therapeutic Modalities:  Cognitive Behavioral Therapy Solution-Focused Therapy Assertiveness Training Relapse Prevention Therapy  PICKETT JR, Ariston Grandison C 03/04/2015, 2:26 PM

## 2015-03-04 NOTE — BHH Group Notes (Signed)
[facility] LCSW Aftercare Discharge Planning Group Note  03/04/2015 8:45 AM  Participation Quality: Alert, Appropriate and Oriented  Mood/Affect: Appropriate  Depression Rating: 5  Anxiety Rating: 3  Thoughts of Suicide: Pt denies SI/HI  Will you contract for safety? Yes  Current AVH: Pt denies  Plan for Discharge/Comments: Pt attended discharge planning group and actively participated in group. CSW discussed suicide prevention education with the group and encouraged them to discuss discharge planning and any relevant barriers. Pt reports improving mood and expresses no needs at this time.  Transportation Means: Pt reports access to transportation  Supports: No supports mentioned at this time  [name], [place] 03/04/2015 10:03 AM

## 2015-03-04 NOTE — Progress Notes (Signed)
Patient ID: [name], female   DOB: [birth year], 44 y.o.   MRN: [id number] [facility] MD Progress Note  03/04/2015 5:08 PM ZEPHANIAH ENYEART  MRN:  [id number] Subjective:   Patient reports partial improvement of depression, which she states " is about a 5/10 right now".  She states she is concerned about her BP being " a little on the low side ". Yesterday had episode of lightheadedness, but not today. Denies feeling dizzy. Reports some improvement in neuro-vegetative symptoms  of depressionbut continues to feel " low energy".  Objective : I have discussed case with treatment team and have met with patient . Patient has remained depressed but is improved compared to admission. She states she continues to feel sad, but seems less intensely ruminative about recent losses, and states she has been told there is a weekend job available for her after discharge, which is also helping her feel better and more future oriented. States, however, that she feels she is " not ready to be discharged yet, I am still depressed ".  Denies suicidal ideations.  As above, reports no side effects from medications , but is concerned that BP has trended low " for my normals". Today BP 103/67. She has been on low dose clonidine for several weeks for " sleep".  Remains visible on the unit, going to groups, interactive with peers.  We reviewed labs - TSH WNL,  Hgb A1C 6.4   Principal Problem: Major Depression, Severe , Recurrent  Diagnosis:   Patient Active Problem List   Diagnosis Date Noted  . MDD (major depressive disorder), recurrent episode, severe ([place]) [F33.2] 03/01/2015  . Vasomotor flushing [R23.2] 09/02/2014  . Vaginal candidiasis [B37.3] 09/02/2014  . Panic disorder [F41.0] 04/25/2014  . MDD (major depressive disorder), recurrent severe, without psychosis ([place]) [F33.2] 04/24/2014  . Obesity (BMI 30-39.9) [E66.9] 02/01/2014  . Uterine cancer ([place]) [C55] 03/31/2013  . Endometrial carcinoma ([place]) [C54.1] 02/02/2013  .  S/P laparoscopic assisted vaginal hysterectomy (LAVH) and bilateral salpingectomy (BS) on 01/22/13 [Z90.710] 01/22/2013   Total Time spent with patient:  25 minutes     Past Medical History:  Past Medical History  Diagnosis Date  . Anemia   . Menorrhagia   . Depression   . Anxiety   . Headache(784.0)     MIGRAINES  . Complication of anesthesia     PT STATES SHE WOKE UP AFTER D&C [place].  STATES NO PROBLEMS WAKING UP AFTER HER HYSTERECTOMY  . History of radiation therapy 04/23/13, 04/29/13, 05/07/13, 05/14/13, 05/21/13    30 Gy to proximal vagina  . Migraines   . Neuropathy ([place])   . Cancer [place])     ENDOMETRIAL CANCER (tx completed 05/2013)    Past Surgical History  Procedure Laterality Date  . Cesarean section      X2  . Tubal ligation    . Dilitation & currettage/hystroscopy with hydrothermal ablation N/A 01/01/2013    Procedure: DILATATION & CURETTAGE/HYSTEROSCOPY WITH ATTEMPTED HYDROTHERMAL ABLATION: Change over to [place] @ 1505;  Surgeon: Osborne Oman, MD;  Location: [place] [facility];  Service: Gynecology;  Laterality: N/A;  . Laparoscopic assisted vaginal hysterectomy N/A 01/22/2013    Procedure: LAPAROSCOPIC ASSISTED VAGINAL HYSTERECTOMY with Bilateral Fallopian Tubes and Ovaries;  Surgeon: Osborne Oman, MD;  Location: [place] [facility];  Service: Gynecology;  Laterality: N/A;  . Robotic assisted total hysterectomy with bilateral salpingo oopherectomy Bilateral 03/31/2013    Procedure: ROBOTIC ASSISTED BILATERAL OOPHORECTOMY PELVIC AND POSSIBLE  PARA-AORTIC LYMPH NODE DISSECTION;  Surgeon: Imagene Gurney A. Alycia Rossetti, MD;  Location: [facility];  Service: Gynecology;  Laterality: Bilateral;   Family History:  Family History  Problem Relation Age of Onset  . Diabetes Mother   . Hypertension Mother   . Cancer Neg Hx     Social History:  History  Alcohol Use No     History  Drug Use No    Social History   Social History  . Marital Status: Divorced     Spouse Name: N/A  . Number of Children: N/A  . Years of Education: N/A   Social History Main Topics  . Smoking status: Former Smoker -- 0.50 packs/day for 26 years    Types: Cigarettes    Quit date: 08/03/2013  . Smokeless tobacco: Never Used  . Alcohol Use: No  . Drug Use: No  . Sexual Activity: Not Currently    Birth Control/ Protection: Surgical   Other Topics Concern  . None   Social History Narrative   Additional Social History:   Sleep: improving   Appetite: improving   Current Medications: Current Facility-Administered Medications  Medication Dose Route Frequency Provider Last Rate Last Dose  . acetaminophen (TYLENOL) tablet 650 mg  650 mg Oral Q4H PRN Delfin Gant, NP   650 mg at 03/04/15 1655  . alum & mag hydroxide-simeth (MAALOX/MYLANTA) 200-200-20 MG/5ML suspension 30 mL  30 mL Oral Q4H PRN Delfin Gant, NP      . cloNIDine (CATAPRES) tablet 0.1 mg  0.1 mg Oral QHS Delfin Gant, NP   0.1 mg at 03/03/15 2206  . DULoxetine (CYMBALTA) DR capsule 60 mg  60 mg Oral BID Jenne Campus, MD   60 mg at 03/04/15 1655  . hydrOXYzine (ATARAX/VISTARIL) tablet 25 mg  25 mg Oral Q6H PRN Jenne Campus, MD   25 mg at 03/03/15 2206  . ibuprofen (ADVIL,MOTRIN) tablet 600 mg  600 mg Oral Q8H PRN Jenne Campus, MD   600 mg at 03/04/15 1658  . loratadine (CLARITIN) tablet 10 mg  10 mg Oral Daily Delfin Gant, NP   10 mg at 03/04/15 0810  . magnesium hydroxide (MILK OF MAGNESIA) suspension 30 mL  30 mL Oral Daily PRN Delfin Gant, NP      . ondansetron (ZOFRAN) tablet 4 mg  4 mg Oral Q8H PRN Delfin Gant, NP      . pantoprazole (PROTONIX) EC tablet 40 mg  40 mg Oral Daily Delfin Gant, NP   40 mg at 03/04/15 0810  . topiramate (TOPAMAX) tablet 50 mg  50 mg Oral BID Delfin Gant, NP   50 mg at 03/04/15 1655    Lab Results:  Results for orders placed or performed during the hospital encounter of 03/01/15 (from the past 48 hour(s))   TSH     Status: None   Collection Time: 03/03/15  6:32 PM  Result Value Ref Range   TSH 1.972 0.350 - 4.500 uIU/mL    Comment: Performed at [facility]  Hemoglobin A1c     Status: Abnormal   Collection Time: 03/03/15  6:32 PM  Result Value Ref Range   Hgb A1c MFr Bld 6.4 (H) 4.8 - 5.6 %    Comment: (NOTE)         Pre-diabetes: 5.7 - 6.4         Diabetes: >6.4         Glycemic control for adults with  diabetes: <7.0    Mean Plasma Glucose 137 mg/dL    Comment: (NOTE) Performed At: [facility] [place], [place] [id number] Lindon Romp MD KG:[id number] Performed at [facility]     Physical Findings: AIMS: Facial and Oral Movements Muscles of Facial Expression: None, normal Lips and Perioral Area: None, normal Jaw: None, normal Tongue: None, normal,Extremity Movements Upper (arms, wrists, hands, fingers): None, normal Lower (legs, knees, ankles, toes): None, normal, Trunk Movements Neck, shoulders, hips: None, normal, Overall Severity Severity of abnormal movements (highest score from questions above): None, normal Incapacitation due to abnormal movements: None, normal Patient's awareness of abnormal movements (rate only patient's report): No Awareness,    CIWA:    COWS:     Musculoskeletal: Strength & Muscle Tone: within normal limits Gait & Station: normal Patient leans: N/A  Psychiatric Specialty Exam: ROS denies chest pain, denies SOB , no vomiting, no rash  Blood pressure 103/67, pulse 65, temperature 97.6 F (36.4 C), temperature source Oral, resp. rate 18, height 5' 6.75" (1.695 m), weight 274 lb 8 oz (124.512 kg), last menstrual period 11/03/2012.Body mass index is 43.34 kg/(m^2).  General Appearance: Well Groomed  Engineer, water::  Good  Speech:  Normal Rate  Volume:  Normal  Mood:  depressed but improved today compared to admission  Affect:  Less constricted and smiles at times appropriately   Thought Process:  Linear  Orientation:  Full (Time, Place, and Person)  Thought Content:  no hallucinations, no delusions   Suicidal Thoughts:  No denies any current suicidal ideations, contracts for safety on unit   Homicidal Thoughts:  No  Memory:   Recent and remote grossly intact   Judgement:   Improving   Insight:  improving   Psychomotor Activity:  Normal  Concentration:  Good  Recall:  Good  Fund of Knowledge:Good  Language: Good  Akathisia:  Negative  Handed:  Right  AIMS (if indicated):     Assets:  Communication Skills Desire for Improvement Resilience  ADL's:  Intact  Cognition: WNL  Sleep:     Assessment - patient presenting with gradually improving depression, although reports ongoing sense of sadness, loss, and ongoing symptoms such as low energy. She does present with improved range of affect and seems less ruminative and more future oriented. Tolerating medications well at this time. BP relatively low ( most recent reading 103/67) , but patient denies current dizziness or lightheadedness. Did have episode of hypotension yesterday, during blood draw. We discussed, and agreed to D/C Clonidine, which she states has been prescribed to help with sleep rather than for BP issues . Treatment Plan Summary: Daily contact with patient to assess and evaluate symptoms and progress in treatment, Medication management, Plan inpatient treatment  and medications as below  Continue to encourage group participation to work on coping skills and symptom reduction Continue Cymbalta at 60 mgrs BID for management of depression D/C Clonidine ( taper down to 0.05 mgrs QDAY x 2 days, then D/C to minimize risk of rebound symptoms)  Continue Topamax 50 mgrs BID for headache/migraine prophylaxis Continue Vistaril 25 mgrs Q 6 hours PRN anxiety We have reviewed HgbA1C finding - 6.4, and reviewed importance of following up with PCP after discharge- patient states she plans to increase physical  activity and modify diet - will order Dietary Consult to assist . COBOS, FERNANDO 03/04/2015, 5:08 PM

## 2015-03-05 NOTE — BHH Group Notes (Signed)
[place] Group Notes:  (Nursing/MHT/Case Management/Adjunct)  Date:  03/05/2015  Time:  2:27 PM  Type of Therapy:  Psychoeducational Skills  Participation Level:  Active  Participation Quality:  Appropriate  Affect:  Appropriate  Cognitive:  Appropriate  Insight:  Appropriate  Engagement in Group:  Engaged  Modes of Intervention:  Discussion  Summary of Progress/Problems: Pt did attend self inventory group.   Benancio Deeds Shanta 03/05/2015, 2:27 PM

## 2015-03-05 NOTE — BHH Group Notes (Signed)
[place] Group Notes:  (Clinical Social Work)   03/05/2015 10:00-11:00AM  Summary of Progress/Problems: In today's process group a decisional balance exercise was used to explore in depth the perceived benefits and costs of unhealthy coping techniques, as well as the benefits and costs of replacing these with healthy coping skills. Among the unhealthy coping techniques listed by the group were drinking, isolating, overtaking pain medications, directing anger inward, trying to control everything, stopping medications, denying grief, rumination, and anger outbursts. Motivational Interviewing and the whiteboard were utilized for the exercises. The patient stated she wants to learn how to stop feeling guilty over things she does not control.  She was one of the most interactive group participants.  She seemed to have good insight and talked about needing to institute a very specific schedule for herself when she gets out of the hospital.  Type of Therapy:  Group Therapy - Process   Participation Level:  Active  Participation Quality:  Attentive, Sharing and Supportive  Affect:  Depressed  Cognitive:  Appropriate  Insight:  Engaged  Engagement in Therapy:  Engaged  Modes of Intervention:  Education, Motivational Interviewing  Sheryl Mathis, [place] 03/05/2015, 12:33 PM

## 2015-03-05 NOTE — Progress Notes (Signed)
D: Sheryl Mathis has been minimally interactive this evening. She states she has been feeling a little more sluggish than usual. She rates her anxiety 3/10. She was active in group this evening however she retired to her room to sleep early in the shift, shortly after group.  A: Encouragement and support given. Q 15 checks for patient safety and medication effectiveness.  R: Continue to monitor for patient safety and medication effectiveness.

## 2015-03-05 NOTE — BHH Group Notes (Signed)
Adult Psychoeducational Group Note  Date:  03/05/2015 Time:  9:24 PM  Group Topic/Focus:  Wrap-Up Group:   The focus of this group is to help patients review their daily goal of treatment and discuss progress on daily workbooks.  Participation Level:  Minimal  Participation Quality:  Appropriate  Affect:  Appropriate  Cognitive:  Appropriate  Insight: Good  Engagement in Group:  Limited  Modes of Intervention:  Discussion  Additional Comments:  Patient stated her day was good.  Her goal for the day was to stay focused and positive.  She may discharge on Monday.  Her goal for the new year is to keep 2016 as a learning experience and make the changes necessary to keep from coming back here.  Sheryl Mathis A 03/05/2015, 9:24 PM

## 2015-03-05 NOTE — Progress Notes (Signed)
Patient ID: [name], female   DOB: 1971/02/21, 44 y.o.   MRN: [id number] Patient ID: ERMINIA MCNEW, female   DOB: 25-Aug-1970, 44 y.o.   MRN: [id number] [facility] MD Progress Note  03/05/2015 3:46 PM ZAYNEB BAUCUM  MRN:  [id number]  Subjective: Judeen Hammans says; "I'm feeling a lot better. My symptoms are responding well to the adjusted dose of the Cymbalta". She says she is currently weaning off of Clonidine due to low blood pressure episodes. Says she is coping better now than when she came in feeling like her whole world was in [place].    Objective : I have discussed case with treatment team and have met with patient . Patient's mood is improving compared to admission. She seems less intensely ruminative about recent losses, and states she has been told there is a weekend job available for her after discharge, which is also helping her feel better and more future oriented. States, however, that she feels she is  not ready to be discharged yet, is weaning off of Clonidine due to low blood pressure. Denies suicidal ideations.  As above, reports no side effects from medications . Remains visible on the unit, going to groups, interactive with peers. Reviewed labs - TSH WNL,  Hgb A1C 6.4   Principal Problem: Major Depression, Severe , Recurrent  Diagnosis:   Patient Active Problem List   Diagnosis Date Noted  . MDD (major depressive disorder), recurrent episode, severe ([place]) [F33.2] 03/01/2015  . Vasomotor flushing [R23.2] 09/02/2014  . Vaginal candidiasis [B37.3] 09/02/2014  . Panic disorder [F41.0] 04/25/2014  . MDD (major depressive disorder), recurrent severe, without psychosis ([place]) [F33.2] 04/24/2014  . Obesity (BMI 30-39.9) [E66.9] 02/01/2014  . Uterine cancer ([place]) [C55] 03/31/2013  . Endometrial carcinoma ([place]) [C54.1] 02/02/2013  . S/P laparoscopic assisted vaginal hysterectomy (LAVH) and bilateral salpingectomy (BS) on 01/22/13 [Z90.710] 01/22/2013   Total Time spent with patient:  15 minutes    Past Medical History:  Past Medical History  Diagnosis Date  . Anemia   . Menorrhagia   . Depression   . Anxiety   . Headache(784.0)     MIGRAINES  . Complication of anesthesia     PT STATES SHE WOKE UP AFTER D&C [place].  STATES NO PROBLEMS WAKING UP AFTER HER HYSTERECTOMY  . History of radiation therapy 04/23/13, 04/29/13, 05/07/13, 05/14/13, 05/21/13    30 Gy to proximal vagina  . Migraines   . Neuropathy ([place])   . Cancer [facility])     ENDOMETRIAL CANCER (tx completed 05/2013)    Past Surgical History  Procedure Laterality Date  . Cesarean section      X2  . Tubal ligation    . Dilitation & currettage/hystroscopy with hydrothermal ablation N/A 01/01/2013    Procedure: DILATATION & CURETTAGE/HYSTEROSCOPY WITH ATTEMPTED HYDROTHERMAL ABLATION: Change over to [place] @ 1505;  Surgeon: Osborne Oman, MD;  Location: [place] [facility];  Service: Gynecology;  Laterality: N/A;  . Laparoscopic assisted vaginal hysterectomy N/A 01/22/2013    Procedure: LAPAROSCOPIC ASSISTED VAGINAL HYSTERECTOMY with Bilateral Fallopian Tubes and Ovaries;  Surgeon: Osborne Oman, MD;  Location: [place] [facility];  Service: Gynecology;  Laterality: N/A;  . Robotic assisted total hysterectomy with bilateral salpingo oopherectomy Bilateral 03/31/2013    Procedure: ROBOTIC ASSISTED BILATERAL OOPHORECTOMY PELVIC AND POSSIBLE PARA-AORTIC LYMPH NODE DISSECTION;  Surgeon: Imagene Gurney A. Alycia Rossetti, MD;  Location: [facility];  Service: Gynecology;  Laterality: Bilateral;   Family History:  Family History  Problem  Relation Age of Onset  . Diabetes Mother   . Hypertension Mother   . Cancer Neg Hx     Social History:  History  Alcohol Use No     History  Drug Use No    Social History   Social History  . Marital Status: Divorced    Spouse Name: N/A  . Number of Children: N/A  . Years of Education: N/A   Social History Main Topics  . Smoking status: Former Smoker -- 0.50 packs/day for 26 years     Types: Cigarettes    Quit date: 08/03/2013  . Smokeless tobacco: Never Used  . Alcohol Use: No  . Drug Use: No  . Sexual Activity: Not Currently    Birth Control/ Protection: Surgical   Other Topics Concern  . None   Social History Narrative   Additional Social History:   Sleep: improving   Appetite: improving   Current Medications: Current Facility-Administered Medications  Medication Dose Route Frequency Provider Last Rate Last Dose  . acetaminophen (TYLENOL) tablet 650 mg  650 mg Oral Q4H PRN Delfin Gant, NP   650 mg at 03/04/15 1655  . alum & mag hydroxide-simeth (MAALOX/MYLANTA) 200-200-20 MG/5ML suspension 30 mL  30 mL Oral Q4H PRN Delfin Gant, NP      . cloNIDine (CATAPRES) tablet 0.05 mg  0.05 mg Oral Daily Myer Peer Cobos, MD   0.05 mg at 03/05/15 6045  . DULoxetine (CYMBALTA) DR capsule 60 mg  60 mg Oral BID Jenne Campus, MD   60 mg at 03/05/15 4098  . hydrOXYzine (ATARAX/VISTARIL) tablet 25 mg  25 mg Oral Q6H PRN Jenne Campus, MD   25 mg at 03/03/15 2206  . ibuprofen (ADVIL,MOTRIN) tablet 600 mg  600 mg Oral Q8H PRN Jenne Campus, MD   600 mg at 03/04/15 1658  . loratadine (CLARITIN) tablet 10 mg  10 mg Oral Daily Delfin Gant, NP   10 mg at 03/05/15 1191  . magnesium hydroxide (MILK OF MAGNESIA) suspension 30 mL  30 mL Oral Daily PRN Delfin Gant, NP      . ondansetron (ZOFRAN) tablet 4 mg  4 mg Oral Q8H PRN Delfin Gant, NP      . pantoprazole (PROTONIX) EC tablet 40 mg  40 mg Oral Daily Delfin Gant, NP   40 mg at 03/05/15 4782  . topiramate (TOPAMAX) tablet 50 mg  50 mg Oral BID Delfin Gant, NP   50 mg at 03/05/15 9562    Lab Results:  Results for orders placed or performed during the hospital encounter of 03/01/15 (from the past 48 hour(s))  TSH     Status: None   Collection Time: 03/03/15  6:32 PM  Result Value Ref Range   TSH 1.972 0.350 - 4.500 uIU/mL    Comment: Performed at [facility]  Hemoglobin A1c     Status: Abnormal   Collection Time: 03/03/15  6:32 PM  Result Value Ref Range   Hgb A1c MFr Bld 6.4 (H) 4.8 - 5.6 %    Comment: (NOTE)         Pre-diabetes: 5.7 - 6.4         Diabetes: >6.4         Glycemic control for adults with diabetes: <7.0    Mean Plasma Glucose 137 mg/dL    Comment: (NOTE) Performed At: [facility] [address] [place], [place] [id number] Lindon Romp  MD PJ:[id number] Performed at [facility]     Physical Findings: AIMS: Facial and Oral Movements Muscles of Facial Expression: None, normal Lips and Perioral Area: None, normal Jaw: None, normal Tongue: None, normal,Extremity Movements Upper (arms, wrists, hands, fingers): None, normal Lower (legs, knees, ankles, toes): None, normal, Trunk Movements Neck, shoulders, hips: None, normal, Overall Severity Severity of abnormal movements (highest score from questions above): None, normal Incapacitation due to abnormal movements: None, normal Patient's awareness of abnormal movements (rate only patient's report): No Awareness, Dental Status Current problems with teeth and/or dentures?: No Does patient usually wear dentures?: No  CIWA:    COWS:     Musculoskeletal: Strength & Muscle Tone: within normal limits Gait & Station: normal Patient leans: N/A  Psychiatric Specialty Exam: Review of Systems  Constitutional: Negative.   HENT: Negative.   Eyes: Negative.   Respiratory: Negative.   Cardiovascular: Negative.   Gastrointestinal: Negative.   Genitourinary: Negative.   Musculoskeletal: Negative.   Skin: Negative.   Endo/Heme/Allergies: Negative.   Psychiatric/Behavioral: Positive for depression ("Improving"). Negative for suicidal ideas, hallucinations, memory loss and substance abuse. The patient is not nervous/anxious and does not have insomnia.    denies chest pain, denies SOB , no vomiting, no rash  Blood pressure 108/67, pulse 84,  temperature 97.6 F (36.4 C), temperature source Oral, resp. rate 18, height 5' 6.75" (1.695 m), weight 124.512 kg (274 lb 8 oz), last menstrual period 11/03/2012.Body mass index is 43.34 kg/(m^2).  General Appearance: Well Groomed  Engineer, water::  Good  Speech:  Normal Rate  Volume:  Normal  Mood:  depressed but improved today compared to admission  Affect:  Less constricted and smiles at times appropriately  Thought Process:  Linear  Orientation:  Full (Time, Place, and Person)  Thought Content:  no hallucinations, no delusions   Suicidal Thoughts:  No denies any current suicidal ideations, contracts for safety on unit   Homicidal Thoughts:  No  Memory:   Recent and remote grossly intact   Judgement:   Improving   Insight:  improving   Psychomotor Activity:  Normal  Concentration:  Good  Recall:  Good  Fund of Knowledge:Good  Language: Good  Akathisia:  Negative  Handed:  Right  AIMS (if indicated):     Assets:  Communication Skills Desire for Improvement Resilience  ADL's:  Intact  Cognition: WNL  Sleep:  Number of Hours: 5.5   Assessment - Patient presenting with gradually improving depression, although reports ongoing sense of sadness, loss, and ongoing symptoms such as low energy. She does present with improved range of affect and seems less ruminative and more future oriented.Tolerating medications well at this time. BP relatively low ( most recent reading 103/67), but patient denies current dizziness or lightheadedness. Did have episode of hypotension yesterday, during blood draw. We discussed, and agreed to D/C Clonidine, which she states has been prescribed to help with sleep rather than for BP issues. Clonidine remains on hold.  Treatment Plan Summary: Daily contact with patient to assess and evaluate symptoms and progress in treatment, Medication management, Plan inpatient treatment  and medications as below  Continue to encourage group participation to work on coping  skills and symptom reduction Continue Cymbalta at 60 mgrs BID for management of depression D/C Clonidine ( taper down to 0.05 mgrs QDAY x 2 days, then D/C to minimize risk of rebound symptoms)  Continue Topamax 50 mgrs BID for headache/migraine prophylaxis Continue Vistaril 25 mgrs Q 6 hours PRN  anxiety We have reviewed HgbA1C finding - 6.4, and reviewed importance of following up with PCP after discharge- patient states she plans to increase physical activity and modify diet - will order Dietary Consult to assist .  Encarnacion Slates, PMHNP, FNP-BC 03/05/2015, 3:46 PM

## 2015-03-05 NOTE — Progress Notes (Signed)
D) Pt has been attending the groups and interacting with her peers. Mood and affect are appropriate. Pt's blood pressure has been within normal limits today. When giving Pt her medications this morning Pt pointed out her tattoo on her left arm and told this writer "I had this tattoo put on so that I know to believe in myself". Continued to talk about how she had never believed in herself at all. Now it was a reminder to always believe and invest in herself. A) given support, reassurance and praise along with encouragment. Provided with a 1:1.  R) Denies SI and HI.

## 2015-03-06 DIAGNOSIS — F332 Major depressive disorder, recurrent severe without psychotic features: Secondary | ICD-10-CM | POA: Insufficient documentation

## 2015-03-06 NOTE — [facility] Group Notes (Signed)
[facility] Group Notes:  (Clinical Social Work)   03/06/2015    1:15-2:15PM  Summary of Progress/Problems:   The main focus of today's process group was to   1)  Discuss any resolutions or goals for the new year  2)  Identify current unhealthy supports and discuss how to set limits with them  3)  Discuss the importance of adding supports  An emphasis was placed on using counselor, doctor, therapy groups, and problem-specific support groups to expand supports.    The patient expressed full comprehension of the concepts presented, and agreed that there is a need to add more supports.  The patient stated little during group, but was very attentive and tracked with her eyes every speaker, nodded in agreement a good deal, smiled and laughed at appropriate times.  She stated that 2016 was a difficult year, but she hopes it will be a learning experience for her.  Type of Therapy:  Process Group with Motivational Interviewing  Participation Level:  Active  Participation Quality:  Attentive  Affect:  Blunted  Cognitive:  Appropriate and Oriented  Insight:  Engaged  Engagement in Therapy:  Engaged  Modes of Intervention:   Education, Support and Processing, Activity  Selmer Dominion, LCSW 03/06/2015    3:19 PM

## 2015-03-06 NOTE — [facility] Group Notes (Signed)

## 2015-03-06 NOTE — Progress Notes (Signed)
Patient ID: Sheryl Mathis, female   DOB: 11-17-1970, 45 y.o.   MRN: [id number] Patient ID: Sheryl Mathis, female   DOB: 1970/09/13, 45 y.o.   MRN: [id number] Patient ID: Sheryl Mathis, female   DOB: February 19, 1971, 45 y.o.   MRN: [id number] [facility] MD Progress Note  03/06/2015 3:39 PM Sheryl Mathis  MRN:  [id number]  Subjective: Sheryl Mathis says; "I'm doing very well today. I have no complains. I'm ready to be discharged in the morning"  Objective : I have discussed case with treatment team and have met with patient . Patient's mood is improving compared to admission. She seems less intensely ruminative about recent losses, and states she has been told there is a weekend job available for her after discharge, which is also helping her feel better and more future oriented. States, however, that she feels she is  n ready to be discharged tomorrow, is still weaning off of Clonidine due to low blood pressure. Denies suicidal ideations.  As above, reports no side effects from medications . Remains visible on the unit, going to groups, interactive with peers. Reviewed labs - TSH WNL,  Hgb A1C 6.4.  Principal Problem: Major Depression, Severe , Recurrent  Diagnosis:   Patient Active Problem List   Diagnosis Date Noted  . MDD (major depressive disorder), recurrent episode, severe ([place]) [F33.2] 03/01/2015  . Vasomotor flushing [R23.2] 09/02/2014  . Vaginal candidiasis [B37.3] 09/02/2014  . Panic disorder [F41.0] 04/25/2014  . MDD (major depressive disorder), recurrent severe, without psychosis ([place]) [F33.2] 04/24/2014  . Obesity (BMI 30-39.9) [E66.9] 02/01/2014  . Uterine cancer ([place]) [C55] 03/31/2013  . Endometrial carcinoma ([place]) [C54.1] 02/02/2013  . S/P laparoscopic assisted vaginal hysterectomy (LAVH) and bilateral salpingectomy (BS) on 01/22/13 [Z90.710] 01/22/2013   Total Time spent with patient:  15 minutes   Past Medical History:  Past Medical History  Diagnosis Date  . Anemia   . Menorrhagia   . Depression    . Anxiety   . Headache(784.0)     MIGRAINES  . Complication of anesthesia     PT STATES SHE WOKE UP AFTER D&C [place].  STATES NO PROBLEMS WAKING UP AFTER HER HYSTERECTOMY  . History of radiation therapy 04/23/13, 04/29/13, 05/07/13, 05/14/13, 05/21/13    30 Gy to proximal vagina  . Migraines   . Neuropathy ([place])   . Cancer [facility])     ENDOMETRIAL CANCER (tx completed 05/2013)    Past Surgical History  Procedure Laterality Date  . Cesarean section      X2  . Tubal ligation    . Dilitation & currettage/hystroscopy with hydrothermal ablation N/A 01/01/2013    Procedure: DILATATION & CURETTAGE/HYSTEROSCOPY WITH ATTEMPTED HYDROTHERMAL ABLATION: Change over to [place] @ 1505;  Surgeon: Osborne Oman, MD;  Location: [facility];  Service: Gynecology;  Laterality: N/A;  . Laparoscopic assisted vaginal hysterectomy N/A 01/22/2013    Procedure: LAPAROSCOPIC ASSISTED VAGINAL HYSTERECTOMY with Bilateral Fallopian Tubes and Ovaries;  Surgeon: Osborne Oman, MD;  Location: [facility];  Service: Gynecology;  Laterality: N/A;  . Robotic assisted total hysterectomy with bilateral salpingo oopherectomy Bilateral 03/31/2013    Procedure: ROBOTIC ASSISTED BILATERAL OOPHORECTOMY PELVIC AND POSSIBLE PARA-AORTIC LYMPH NODE DISSECTION;  Surgeon: Imagene Gurney A. Alycia Rossetti, MD;  Location: [facility];  Service: Gynecology;  Laterality: Bilateral;   Family History:  Family History  Problem Relation Age of Onset  . Diabetes Mother   . Hypertension Mother   . Cancer Neg Hx  Social History:  History  Alcohol Use No     History  Drug Use No    Social History   Social History  . Marital Status: Divorced    Spouse Name: N/A  . Number of Children: N/A  . Years of Education: N/A   Social History Main Topics  . Smoking status: Former Smoker -- 0.50 packs/day for 26 years    Types: Cigarettes    Quit date: 08/03/2013  . Smokeless tobacco: Never Used  . Alcohol Use: No  . Drug Use:  No  . Sexual Activity: Not Currently    Birth Control/ Protection: Surgical   Other Topics Concern  . None   Social History Narrative   Additional Social History:   Sleep: improving   Appetite: improving   Current Medications: Current Facility-Administered Medications  Medication Dose Route Frequency Provider Last Rate Last Dose  . acetaminophen (TYLENOL) tablet 650 mg  650 mg Oral Q4H PRN Delfin Gant, NP   650 mg at 03/04/15 1655  . alum & mag hydroxide-simeth (MAALOX/MYLANTA) 200-200-20 MG/5ML suspension 30 mL  30 mL Oral Q4H PRN Delfin Gant, NP      . cloNIDine (CATAPRES) tablet 0.05 mg  0.05 mg Oral Daily Myer Peer Cobos, MD   0.05 mg at 03/06/15 0814  . DULoxetine (CYMBALTA) DR capsule 60 mg  60 mg Oral BID Jenne Campus, MD   60 mg at 03/06/15 0813  . hydrOXYzine (ATARAX/VISTARIL) tablet 25 mg  25 mg Oral Q6H PRN Jenne Campus, MD   25 mg at 03/05/15 2133  . ibuprofen (ADVIL,MOTRIN) tablet 600 mg  600 mg Oral Q8H PRN Jenne Campus, MD   600 mg at 03/06/15 0924  . loratadine (CLARITIN) tablet 10 mg  10 mg Oral Daily Delfin Gant, NP   10 mg at 03/06/15 0814  . magnesium hydroxide (MILK OF MAGNESIA) suspension 30 mL  30 mL Oral Daily PRN Delfin Gant, NP      . ondansetron (ZOFRAN) tablet 4 mg  4 mg Oral Q8H PRN Delfin Gant, NP      . pantoprazole (PROTONIX) EC tablet 40 mg  40 mg Oral Daily Delfin Gant, NP   40 mg at 03/06/15 0814  . topiramate (TOPAMAX) tablet 50 mg  50 mg Oral BID Delfin Gant, NP   50 mg at 03/06/15 1025    Lab Results:  No results found for this or any previous visit (from the past 48 hour(s)).  Physical Findings: AIMS: Facial and Oral Movements Muscles of Facial Expression: None, normal Lips and Perioral Area: None, normal Jaw: None, normal Tongue: None, normal,Extremity Movements Upper (arms, wrists, hands, fingers): None, normal Lower (legs, knees, ankles, toes): None, normal, Trunk  Movements Neck, shoulders, hips: None, normal, Overall Severity Severity of abnormal movements (highest score from questions above): None, normal Incapacitation due to abnormal movements: None, normal Patient's awareness of abnormal movements (rate only patient's report): No Awareness, Dental Status Current problems with teeth and/or dentures?: No Does patient usually wear dentures?: No  CIWA:    COWS:     Musculoskeletal: Strength & Muscle Tone: within normal limits Gait & Station: normal Patient leans: N/A  Psychiatric Specialty Exam: Review of Systems  Constitutional: Negative.   HENT: Negative.   Eyes: Negative.   Respiratory: Negative.   Cardiovascular: Negative.   Gastrointestinal: Negative.   Genitourinary: Negative.   Musculoskeletal: Negative.   Skin: Negative.   Endo/Heme/Allergies: Negative.   Psychiatric/Behavioral:  Positive for depression ("Improving"). Negative for suicidal ideas, hallucinations, memory loss and substance abuse. The patient is not nervous/anxious and does not have insomnia.    denies chest pain, denies SOB , no vomiting, no rash  Blood pressure 104/67, pulse 86, temperature 97.6 F (36.4 C), temperature source Oral, resp. rate 16, height 5' 6.75" (1.695 m), weight 124.512 kg (274 lb 8 oz), last menstrual period 11/03/2012.Body mass index is 43.34 kg/(m^2).  General Appearance: Well Groomed  Engineer, water::  Good  Speech:  Normal Rate  Volume:  Normal  Mood:  depressed but improved today compared to admission  Affect:  Less constricted and smiles at times appropriately  Thought Process:  Linear  Orientation:  Full (Time, Place, and Person)  Thought Content:  no hallucinations, no delusions   Suicidal Thoughts:  No denies any current suicidal ideations, contracts for safety on unit   Homicidal Thoughts:  No  Memory:   Recent and remote grossly intact   Judgement:   Improving   Insight:  improving   Psychomotor Activity:  Normal  Concentration:   Good  Recall:  Good  Fund of Knowledge:Good  Language: Good  Akathisia:  Negative  Handed:  Right  AIMS (if indicated):     Assets:  Communication Skills Desire for Improvement Resilience  ADL's:  Intact  Cognition: WNL  Sleep:  Number of Hours: 6.5   Assessment - Patient presenting with gradually improving depression, although reports ongoing sense of sadness, loss, and ongoing symptoms such as low energy. She does present with improved range of affect and seems less ruminative and more future oriented.Tolerating medications well at this time. BP relatively low ( most recent reading 103/67), but patient denies current dizziness or lightheadedness. Did have episode of hypotension yesterday, during blood draw. We discussed, and agreed to D/C Clonidine, which she states has been prescribed to help with sleep rather than for BP issues. Clonidine remains on hold.  Treatment Plan Summary: Daily contact with patient to assess and evaluate symptoms and progress in treatment, Medication management, Plan inpatient treatment  and medications as below  Continue to encourage group participation to work on coping skills and symptom reduction Continue Cymbalta at 60 mgrs BID for management of depression D/C Clonidine ( taper down to 0.05 mgrs QDAY x 2 days, then D/C to minimize risk of rebound symptoms)  Continue Topamax 50 mgrs BID for headache/migraine prophylaxis Continue Vistaril 25 mgrs Q 6 hours PRN anxiety We have reviewed HgbA1C finding - 6.4, and reviewed importance of following up with PCP after discharge- patient states she plans to increase physical activity and modify diet - will order Dietary Consult to assist .  Encarnacion Slates, PMHNP, FNP-BC 03/06/2015, 3:39 PM

## 2015-03-06 NOTE — Progress Notes (Signed)
Patient has been up in the dayroom watching tv and interacting with peers. She was glad to see her oldest son who visited tonight. She is hopeful to discharge on tomorrow and reports being glad that she came in for her depression. She denies si/hi/a/v hallucinations. Support given and safety maintained on unit with 15 min checks.

## 2015-03-06 NOTE — Progress Notes (Signed)
Patient ID: Sheryl Mathis, female   DOB: 1970/11/15, 45 y.o.   MRN: [id number]   D: Pt has been appropriate on the unit today, she reported that she felt much better. Pt reported that she was just ready for discharge. Pt reported that her depression was a 4, her hopelessness was a 2, and her anxiety was a 2. Pt reported that her goal for today was to go home tomorrow. Pt reported being negative SI/HI, no AH/VH noted. A: 15 min checks continued for patient safety. R: Pt safety maintained.

## 2015-03-06 NOTE — Progress Notes (Signed)
  D: Patient pleasant and cooperative with care this shift, but does have a dull, flat affect on approach. Pt participated in group session.  A: Q 15 minute safety checks, encourage peer interaction, redirect behaviors as needed, administer medications as ordered by MD. R: Patient denies SI/HI or plans to harm herself at this time. No s/s of distress noted.

## 2015-03-07 MED ORDER — LORATADINE 10 MG PO TABS: 10.0000 mg | ORAL_TABLET | Freq: Every day | ORAL | Status: AC

## 2015-03-07 MED ORDER — HYDROXYZINE HCL 25 MG PO TABS: 25.0000 mg | ORAL_TABLET | Freq: Four times a day (QID) | ORAL | Status: DC | PRN

## 2015-03-07 MED ORDER — TOPIRAMATE 25 MG PO TABS: 50.0000 mg | ORAL_TABLET | Freq: Two times a day (BID) | ORAL | Status: AC

## 2015-03-07 MED ORDER — IBUPROFEN 600 MG PO TABS: 600.0000 mg | ORAL_TABLET | Freq: Four times a day (QID) | ORAL | Status: DC | PRN

## 2015-03-07 MED ORDER — DULOXETINE HCL 60 MG PO CPEP: 60.0000 mg | ORAL_CAPSULE | Freq: Two times a day (BID) | ORAL | Status: AC

## 2015-03-07 MED ORDER — OMEPRAZOLE 20 MG PO CPDR: 20.0000 mg | DELAYED_RELEASE_CAPSULE | Freq: Every day | ORAL | Status: AC
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
## 2015-03-07 NOTE — Progress Notes (Signed)
Recreation Therapy Notes  Date: 01.02.2017 Time: 9:30am Location: 300 Group Room   Group Topic: Stress Management  Goal Area(s) Addresses:  Patient will actively participate in stress management techniques presented during session.   Behavioral Response: Did not attend.   Laureen Ochs Zavia Pullen, LRT/CTRS        Tamerra Merkley L 03/07/2015 10:19 AM

## 2015-03-07 NOTE — Plan of Care (Signed)
Problem: Alteration in mood Goal: LTG-Patient reports reduction in suicidal thoughts (Patient reports reduction in suicidal thoughts and is able to verbalize a safety plan for whenever patient is feeling suicidal)  Outcome: Progressing Patient currently denies suicidal ideations.

## 2015-03-07 NOTE — Tx Team (Addendum)
Interdisciplinary Treatment Plan Update (Adult) Date: 03/07/2015   Date: 03/07/2015 10:24 AM  Progress in Treatment:  Attending groups: Yes  Participating in groups: Yes  Taking medication as prescribed: Yes  Tolerating medication: Yes  Family/Significant othe contact made: Yes, CSW has spoken with patient's mother Patient understands diagnosis: Yes Discussing patient identified problems/goals with staff: Yes  Medical problems stabilized or resolved: Yes  Denies suicidal/homicidal ideation: Yes Patient has not harmed self or Others: Yes   New problem(s) identified: None identified at this time.   Discharge Plan or Barriers: Pt will return home and follow-up with outpatient services  Additional comments:  Patient and CSW reviewed pt's identified goals and treatment plan. Patient verbalized understanding and agreed to treatment plan. CSW reviewed [facility] "Discharge Process and Patient Involvement" Form. Pt verbalized understanding of information provided and signed form.   Reason for Continuation of Hospitalization:  Anxiety Depression Medication stabilization Suicidal ideation   Estimated length of stay: Discharge anticipated for 03/07/15  Review of initial/current patient goals per problem list:   1.  Goal(s): Patient will participate in aftercare plan  Met:  Yes  Target date: 3-5 days from date of admission   As evidenced by: Patient will participate within aftercare plan AEB aftercare provider and housing plan at discharge being identified.   03/02/15: Pt will return home and follow-up with outpatient services  2.  Goal (s): Patient will exhibit decreased depressive symptoms and suicidal ideations.  Met:  Yes  Target date: 3-5 days from date of admission   As evidenced by: Patient will utilize self rating of depression at 3 or below and demonstrate decreased signs of depression or be deemed stable for discharge by MD.  03/02/15: Pt rates depression at 10/10; denies SI.  Observed to be tearful  1/2: Goal met. Patient rates depression at 3 today, denies SI.  3.  Goal(s): Patient will demonstrate decreased signs and symptoms of anxiety.  Met:  Adequate for discharge per MD.  Target date: 3-5 days from date of admission   As evidenced by: Patient will utilize self rating of anxiety at 3 or below and demonstrated decreased signs of anxiety, or be deemed stable for discharge by MD  03/02/15: Pt rates anxiety at 5/10.  1/2: Adequate for discharge per MD: Pt rates anxiety at 4 today.  Attendees: Patient:    Family:    Physician: Dr. Shea Evans, Dr. Sabra Heck  03/07/2015 9:30 AM  Nursing: Marcella Dubs, [facility], [facility], RN 03/07/2015 9:30 AM  Clinical Social Worker: Tilden Fossa, LCSWA 03/07/2015 9:30 AM  Other: [name], LCSW; [facility], LCSW  03/07/2015 9:30 AM   03/07/2015 9:30 AM  Other: Lars Pinks, Case Manager 03/07/2015 9:30 AM  Other: Agustina Caroli, NP 03/07/2015 9:30 AM  Other:           Tilden Fossa, MSW, [place] Worker [facility] [phone]
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
## 2015-03-07 NOTE — Progress Notes (Signed)
  [facility] Adult Case Management Discharge Plan :  Will you be returning to the same living situation after discharge:  Yes,  patient plans to stay with her father in [place] At discharge, do you have transportation home?: Yes,  friend/family to transport Do you have the ability to pay for your medications: Yes,  Patient will be provided with prescriptions at discharge  Release of information consent forms completed and in the chart;  Patient's signature needed at discharge.  Patient to Follow up at: Follow-up Information    Follow up with [facility]   Specialty:  Behavioral Health   Why:  Please walk-in between 8am-3pm Monday-Friday to be seen for your hospital discharge appointment. It is suggested that you arrive early to avoid long wait times. Please inform staff that you are there for a hospital discharge appt   Contact information:   [place] [address] [phone]       Next level of care provider has access to [place] and Suicide Prevention discussed: Yes,  with patient and mother  Have you used any form of tobacco in the last 30 days? (Cigarettes, Smokeless Tobacco, Cigars, and/or Pipes): No  Has patient been referred to the Quitline?: N/A patient is not a smoker  Patient has been referred for addiction treatment: N/A  Lucca Greggs, Erasmo Downer L 03/07/2015, 11:00 AM

## 2015-03-07 NOTE — Progress Notes (Signed)
Discharge note:  Patient received all personal belongings from her room and locker.  Reviewed discharge instructions, medications, prescriptions and follow up information.  Patient indicated understanding.  Patient received samples of her medications.  Patient denies SI/HI/AVH.  Patient left ambulatory with her mother.

## 2015-03-07 NOTE — BHH Group Notes (Signed)
   [facility] LCSW Aftercare Discharge Planning Group Note  03/07/2015  8:45 AM   Participation Quality: Alert, Appropriate and Oriented  Mood/Affect: Appropriate  Depression Rating: 3  Anxiety Rating: 4  Thoughts of Suicide: Pt denies SI/HI  Will you contract for safety? Yes  Current AVH: Pt denies  Plan for Discharge/Comments: Pt attended discharge planning group and actively participated in group. CSW provided pt with today's workbook. Patient reports feeling "good" today and plans to return home to follow up with outpatient services.   Transportation Means: Pt reports access to transportation  Supports: No supports mentioned at this time  Tilden Fossa, MSW, [place] Social Worker [employer] [phone]

## 2015-04-28 ENCOUNTER — Telehealth: Payer: Self-pay | Admitting: *Deleted

## 2015-04-28 NOTE — Telephone Encounter (Signed)
Pt called to cancel her appointment with Dr Skeet Latch on March 2. Pt stated "I am scheduled for gallbladder surgery on march 2 at [facility]". Appointment with Dr. Skeet Latch was rescheduled for March 30th. Pt agreed with time and date of appointment

## 2015-05-05 ENCOUNTER — Ambulatory Visit: Payer: Self-pay | Admitting: Gynecologic Oncology

## 2015-06-02 ENCOUNTER — Encounter: Payer: Self-pay | Admitting: Gynecologic Oncology

## 2015-06-02 ENCOUNTER — Ambulatory Visit: Payer: Self-pay | Attending: Gynecologic Oncology | Admitting: Gynecologic Oncology

## 2015-06-02 VITALS — BP 120/62 | HR 79 | Temp 97.8°F | Resp 18 | Ht 66.75 in | Wt 279.6 lb

## 2015-06-02 DIAGNOSIS — B3731 Acute candidiasis of vulva and vagina: Secondary | ICD-10-CM

## 2015-06-02 DIAGNOSIS — N83202 Unspecified ovarian cyst, left side: Secondary | ICD-10-CM | POA: Insufficient documentation

## 2015-06-02 DIAGNOSIS — C541 Malignant neoplasm of endometrium: Secondary | ICD-10-CM

## 2015-06-02 DIAGNOSIS — F329 Major depressive disorder, single episode, unspecified: Secondary | ICD-10-CM | POA: Insufficient documentation

## 2015-06-02 DIAGNOSIS — Z90710 Acquired absence of both cervix and uterus: Secondary | ICD-10-CM | POA: Insufficient documentation

## 2015-06-02 DIAGNOSIS — G43909 Migraine, unspecified, not intractable, without status migrainosus: Secondary | ICD-10-CM | POA: Insufficient documentation

## 2015-06-02 DIAGNOSIS — M79672 Pain in left foot: Secondary | ICD-10-CM

## 2015-06-02 DIAGNOSIS — Z87891 Personal history of nicotine dependence: Secondary | ICD-10-CM | POA: Insufficient documentation

## 2015-06-02 DIAGNOSIS — Z6841 Body Mass Index (BMI) 40.0 and over, adult: Secondary | ICD-10-CM | POA: Insufficient documentation

## 2015-06-02 DIAGNOSIS — F332 Major depressive disorder, recurrent severe without psychotic features: Secondary | ICD-10-CM

## 2015-06-02 DIAGNOSIS — F419 Anxiety disorder, unspecified: Secondary | ICD-10-CM | POA: Insufficient documentation

## 2015-06-02 DIAGNOSIS — K8020 Calculus of gallbladder without cholecystitis without obstruction: Secondary | ICD-10-CM | POA: Insufficient documentation

## 2015-06-02 DIAGNOSIS — Z08 Encounter for follow-up examination after completed treatment for malignant neoplasm: Secondary | ICD-10-CM | POA: Insufficient documentation

## 2015-06-02 DIAGNOSIS — R590 Localized enlarged lymph nodes: Secondary | ICD-10-CM | POA: Insufficient documentation

## 2015-06-02 DIAGNOSIS — Z8542 Personal history of malignant neoplasm of other parts of uterus: Secondary | ICD-10-CM | POA: Insufficient documentation

## 2015-06-02 DIAGNOSIS — N83201 Unspecified ovarian cyst, right side: Secondary | ICD-10-CM | POA: Insufficient documentation

## 2015-06-02 DIAGNOSIS — B373 Candidiasis of vulva and vagina: Secondary | ICD-10-CM | POA: Insufficient documentation

## 2015-06-02 DIAGNOSIS — K760 Fatty (change of) liver, not elsewhere classified: Secondary | ICD-10-CM | POA: Insufficient documentation

## 2015-06-02 DIAGNOSIS — E669 Obesity, unspecified: Secondary | ICD-10-CM | POA: Insufficient documentation

## 2015-06-02 DIAGNOSIS — D649 Anemia, unspecified: Secondary | ICD-10-CM | POA: Insufficient documentation

## 2015-06-02 DIAGNOSIS — G629 Polyneuropathy, unspecified: Secondary | ICD-10-CM | POA: Insufficient documentation

## 2015-06-02 MED ORDER — FLUCONAZOLE 100 MG PO TABS: 100.0000 mg | ORAL_TABLET | Freq: Once | ORAL | Status: DC

## 2015-06-02 NOTE — Patient Instructions (Signed)
Follow-up in 6 months Place ice over the left foot Continu ibuprophen  Vaginal yeast infection Diflucan    Thank you very much Ms. Georges Lynch Hillmer for allowing me to provide care for you today.  I appreciate your confidence in choosing our Gynecologic Oncology team.  If you have any questions about your visit today please call our office and we will get back to you as soon as possible.  Please consider using the website Medlineplus.gov as an Geneticist, molecular.   Francetta Found. Bentleigh Stankus MD., PhD Gynecologic Oncology
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
## 2015-09-27 ENCOUNTER — Encounter (HOSPITAL_COMMUNITY): Payer: Self-pay | Admitting: Emergency Medicine

## 2015-09-27 ENCOUNTER — Emergency Department (HOSPITAL_COMMUNITY)
Admission: EM | Admit: 2015-09-27 | Discharge: 2015-09-27 | Disposition: A | Discharge status: Home / Self Care | Payer: Self-pay | Attending: Emergency Medicine | Admitting: Emergency Medicine

## 2015-09-27 DIAGNOSIS — L723 Sebaceous cyst: Secondary | ICD-10-CM | POA: Insufficient documentation

## 2015-09-27 DIAGNOSIS — Z79899 Other long term (current) drug therapy: Secondary | ICD-10-CM | POA: Insufficient documentation

## 2015-09-27 DIAGNOSIS — Z87891 Personal history of nicotine dependence: Secondary | ICD-10-CM | POA: Insufficient documentation

## 2015-09-27 DIAGNOSIS — Z8542 Personal history of malignant neoplasm of other parts of uterus: Secondary | ICD-10-CM | POA: Insufficient documentation

## 2015-09-27 DIAGNOSIS — Z859 Personal history of malignant neoplasm, unspecified: Secondary | ICD-10-CM | POA: Insufficient documentation

## 2015-09-27 DIAGNOSIS — F332 Major depressive disorder, recurrent severe without psychotic features: Secondary | ICD-10-CM | POA: Insufficient documentation

## 2015-09-27 MED ORDER — LIDOCAINE HCL 2 % IJ SOLN
10.0000 mL | Freq: Once | INTRAMUSCULAR | Status: AC
  Administered 2015-09-27: 15:00:00
  Filled 2015-09-27: qty 20

## 2015-09-27 MED ORDER — SULFAMETHOXAZOLE-TRIMETHOPRIM 800-160 MG PO TABS: 1.0000 | ORAL_TABLET | Freq: Two times a day (BID) | ORAL | 0 refills | Status: AC

## 2015-09-27 MED ORDER — HYDROCODONE-ACETAMINOPHEN 5-325 MG PO TABS: 2.0000 | ORAL_TABLET | ORAL | 0 refills | Status: DC | PRN

## 2015-09-27 MED ORDER — LORAZEPAM 1 MG PO TABS
1.0000 mg | ORAL_TABLET | Freq: Once | ORAL | Status: AC
  Administered 2015-09-27: 1 mg via ORAL
  Filled 2015-09-27: qty 1

## 2015-09-27 NOTE — ED Notes (Signed)
Wound dressed with 2x2 . Pt given some 2x2 and tape to change dressings. She stated she does feel better.

## 2015-09-27 NOTE — ED Triage Notes (Signed)
Pt presents to ED with an abscess on left shoulder.  It began 3 days and has continued to get worse.  No fevers.  Called doctor about getting an appt but could not get in for 2 weeks. Doctor recommended that she go to ED to get abscess checked up.

## 2015-09-27 NOTE — ED Provider Notes (Signed)
[facility] DEPT Provider Note   CSN: [id number] Arrival date & time: 09/27/15  1145  First Provider Contact:  None    By signing my name below, I, Eustaquio Maize, attest that this documentation has been prepared under the direction and in the presence of Alyse Low, [place].  Electronically Signed: Eustaquio Maize, ED Scribe. 09/27/15. 2:38 PM.   History   Chief Complaint Chief Complaint  Patient presents with  . Abscess    HPI Sheryl Mathis is a 45 y.o. female who presents to the Emergency Department complaining of gradual onset, constant, worsening, area of pain, swelling, and redness to posterior left shoulder x 3 days. Pt states that she has always had a small knot to the area but it has worsened in the past 3 days. Denies fever, chills, or any other associated symptoms.    The history is provided by the patient. No language interpreter was used.    Past Medical History:  Diagnosis Date  . Anemia   . Anxiety   . Cancer [facility])    ENDOMETRIAL CANCER (tx completed 05/2013)  . Complication of anesthesia    PT STATES SHE WOKE UP AFTER D&C [place].  STATES NO PROBLEMS WAKING UP AFTER HER HYSTERECTOMY  . Depression   . Headache(784.0)    MIGRAINES  . History of radiation therapy 04/23/13, 04/29/13, 05/07/13, 05/14/13, 05/21/13   30 Gy to proximal vagina  . Menorrhagia   . Migraines   . Neuropathy [facility])     Patient Active Problem List   Diagnosis Date Noted  . Severe episode of recurrent major depressive disorder, without psychotic features ([place])   . MDD (major depressive disorder), recurrent episode, severe ([place]) 03/01/2015  . Vasomotor flushing 09/02/2014  . Vaginal candidiasis 09/02/2014  . Panic disorder 04/25/2014  . MDD (major depressive disorder), recurrent severe, without psychosis ([place]) 04/24/2014  . Obesity (BMI 30-39.9) 02/01/2014  . Uterine cancer ([place]) 03/31/2013  . Endometrial carcinoma ([place]) 02/02/2013  . S/P laparoscopic assisted  vaginal hysterectomy ([facility]) and bilateral salpingectomy (BS) on 01/22/13 01/22/2013    Past Surgical History:  Procedure Laterality Date  . CESAREAN SECTION     X2  . CHOLECYSTECTOMY    . DILITATION & CURRETTAGE/HYSTROSCOPY WITH HYDROTHERMAL ABLATION N/A 01/01/2013   Procedure: DILATATION & CURETTAGE/HYSTEROSCOPY WITH ATTEMPTED HYDROTHERMAL ABLATION: Change over to [place] @ 3903;  Surgeon: Osborne Oman, MD;  Location: [facility];  Service: Gynecology;  Laterality: N/A;  . LAPAROSCOPIC ASSISTED VAGINAL HYSTERECTOMY N/A 01/22/2013   Procedure: LAPAROSCOPIC ASSISTED VAGINAL HYSTERECTOMY with Bilateral Fallopian Tubes and Ovaries;  Surgeon: Osborne Oman, MD;  Location: [facility];  Service: Gynecology;  Laterality: N/A;  . ROBOTIC ASSISTED TOTAL HYSTERECTOMY WITH BILATERAL SALPINGO OOPHERECTOMY Bilateral 03/31/2013   Procedure: ROBOTIC ASSISTED BILATERAL OOPHORECTOMY PELVIC AND POSSIBLE PARA-AORTIC LYMPH NODE DISSECTION;  Surgeon: Imagene Gurney A. Alycia Rossetti, MD;  Location: [facility];  Service: Gynecology;  Laterality: Bilateral;  . TUBAL LIGATION      OB History    Gravida Para Term Preterm AB Living   '2 2 2     2   '$ SAB TAB Ectopic Multiple Live Births                   Home Medications    Prior to Admission medications   Medication Sig Start Date End Date Taking? Authorizing Provider  ALPRAZolam Duanne Moron) 0.5 MG tablet Take 0.5 mg by mouth daily as needed for anxiety (Take one tablet by mouth  one hour prior to dental procedure).    Historical Provider, MD  busPIRone (BUSPAR) 7.5 MG tablet Take 7.5 mg by mouth 3 (three) times daily.    Historical Provider, MD  DULoxetine (CYMBALTA) 60 MG capsule Take 1 capsule (60 mg total) by mouth 2 (two) times daily. For depression 03/07/15   Encarnacion Slates, NP  fluconazole (DIFLUCAN) 100 MG tablet Take 1 tablet (100 mg total) by mouth once. 06/02/15   Dorothyann Gibbs, NP  hydrOXYzine (ATARAX/VISTARIL) 25 MG tablet Take 1 tablet (25 mg total) by mouth  every 6 (six) hours as needed for anxiety. 03/07/15   Encarnacion Slates, NP  ibuprofen (ADVIL,MOTRIN) 600 MG tablet Take 1 tablet (600 mg total) by mouth every 6 (six) hours as needed for moderate pain. 03/07/15   Encarnacion Slates, NP  loratadine (CLARITIN) 10 MG tablet Take 1 tablet (10 mg total) by mouth daily. For allergies 03/07/15   Encarnacion Slates, NP  omeprazole (PRILOSEC) 20 MG capsule Take 1 capsule (20 mg total) by mouth daily. For acid reflux 03/07/15   Encarnacion Slates, NP  ondansetron (ZOFRAN) 4 MG tablet Take 4 mg by mouth every 6 (six) hours as needed for nausea or vomiting.    Historical Provider, MD  oxyCODONE-acetaminophen (PERCOCET/ROXICET) 5-325 MG tablet Take 1-2 tablets by mouth every 6 (six) hours as needed for severe pain.    Historical Provider, MD  polyethylene glycol (MIRALAX) packet Take 17 g by mouth. 05/05/15   Historical Provider, MD  topiramate (TOPAMAX) 25 MG tablet Take 2 tablets (50 mg total) by mouth 2 (two) times daily. For migraine headache pain 03/07/15   Encarnacion Slates, NP    Family History Family History  Problem Relation Age of Onset  . Diabetes Mother   . Hypertension Mother   . Cancer Neg Hx     Social History Social History  Substance Use Topics  . Smoking status: Former Smoker    Packs/day: 0.50    Years: 26.00    Types: Cigarettes    Quit date: 08/03/2013  . Smokeless tobacco: Never Used  . Alcohol use No     Allergies   Review of patient's allergies indicates no known allergies.   Review of Systems Review of Systems  Constitutional: Negative for chills and fever.  Skin: Positive for color change.       Positive for redness, swelling, and pain to left shoulder  All other systems reviewed and are negative.    Physical Exam Updated Vital Signs BP 122/76 (BP Location: Left Arm)   Pulse 111   Temp 98 F (36.7 C) (Oral)   Resp 17   Ht '5\' 7"'$  (1.702 m)   Wt 284 lb (128.8 kg)   LMP 11/03/2012   SpO2 98%   BMI 44.48 kg/m   Physical Exam    Constitutional: She is oriented to person, place, and time. She appears well-developed and well-nourished. No distress.  HENT:  Head: Normocephalic and atraumatic.  Eyes: Conjunctivae and EOM are normal.  Neck: Neck supple. No tracheal deviation present.  Cardiovascular: Normal rate.   Pulmonary/Chest: Effort normal. No respiratory distress.  Abdominal: Soft.  Musculoskeletal: Normal range of motion.  Neurological: She is alert and oriented to person, place, and time.  Skin: Skin is warm and dry.  2 cm round sebaceous cyst on left upper back  Psychiatric: She has a normal mood and affect. Her behavior is normal.  Nursing note and vitals reviewed.  ED Treatments / Results  DIAGNOSTIC STUDIES: Oxygen Saturation is 98% on RA, normal by my interpretation.    COORDINATION OF CARE: 2:38 PM-Discussed treatment plan which includes I&D with pt at bedside and pt agreed to plan.    Labs (all labs ordered are listed, but only abnormal results are displayed) Labs Reviewed - No data to display  EKG  EKG Interpretation None       Radiology No results found.  Procedures .Marland KitchenIncision and Drainage Date/Time: 09/27/2015 3:34 PM Performed by: Fransico Meadow Authorized by: Fransico Meadow   Consent:    Consent obtained:  Verbal   Consent given by:  Patient   Risks discussed:  Pain and incomplete drainage   Alternatives discussed:  Alternative treatment Location:    Type:  Cyst   Size:  2 cm   Location:  Trunk   Trunk location:  Back Pre-procedure details:    Skin preparation:  Betadine Sedation:    Sedation type:  Anxiolysis Anesthesia (see MAR for exact dosages):    Anesthesia method:  Local infiltration   Local anesthetic: 4 CCs 2% xylocaine. Procedure type:    Complexity:  Simple Procedure details:    Needle aspiration: no     Incision types:  Single straight (5 mm incision)   Scalpel blade:  11   Drainage:  Purulent (sebaceous cystic material)   Drainage amount:   Scant   Wound treatment:  Wound left open   Packing materials:  None Post-procedure details:    Patient tolerance of procedure:  Tolerated well, no immediate complications   (including critical care time)  Medications Ordered in ED Medications - No data to display   Initial Impression / Assessment and Plan / ED Course  I have reviewed the triage vital signs and the nursing notes.  Pertinent labs & imaging results that were available during my care of the patient were reviewed by me and considered in my medical decision making (see chart for details).  Clinical Course       Final Clinical Impressions(s) / ED Diagnoses   Final diagnoses:  Sebaceous cyst    New Prescriptions New Prescriptions   No medications on file   Meds ordered this encounter  Medications  . LORazepam (ATIVAN) tablet 1 mg  . lidocaine (XYLOCAINE) 2 % (with pres) injection 200 mg  . sulfamethoxazole-trimethoprim (BACTRIM DS,SEPTRA DS) 800-160 MG tablet    Sig: Take 1 tablet by mouth 2 (two) times daily.    Dispense:  14 tablet    Refill:  0    Order Specific Question:   Supervising Provider    Answer:   Lajean Saver [[id number]]  . HYDROcodone-acetaminophen (NORCO/VICODIN) 5-325 MG tablet    Sig: Take 2 tablets by mouth every 4 (four) hours as needed.    Dispense:  10 tablet    Refill:  0    Order Specific Question:   Supervising Provider    Answer:   Lajean Saver [place], PA-C 09/27/15 [place], MD 09/27/15 1730

## 2015-09-29 ENCOUNTER — Inpatient Hospital Stay (HOSPITAL_COMMUNITY)
Admission: EM | Admit: 2015-09-29 | Discharge: 2015-10-02 | DRG: 092 | Disposition: A | Discharge status: Home / Self Care | Payer: No Typology Code available for payment source | Attending: Internal Medicine | Admitting: Internal Medicine

## 2015-09-29 ENCOUNTER — Encounter (HOSPITAL_COMMUNITY): Payer: Self-pay | Admitting: Emergency Medicine

## 2015-09-29 DIAGNOSIS — Z923 Personal history of irradiation: Secondary | ICD-10-CM

## 2015-09-29 DIAGNOSIS — F332 Major depressive disorder, recurrent severe without psychotic features: Secondary | ICD-10-CM | POA: Diagnosis present

## 2015-09-29 DIAGNOSIS — Z833 Family history of diabetes mellitus: Secondary | ICD-10-CM

## 2015-09-29 DIAGNOSIS — Z6841 Body Mass Index (BMI) 40.0 and over, adult: Secondary | ICD-10-CM

## 2015-09-29 DIAGNOSIS — C541 Malignant neoplasm of endometrium: Secondary | ICD-10-CM | POA: Diagnosis present

## 2015-09-29 DIAGNOSIS — Z9049 Acquired absence of other specified parts of digestive tract: Secondary | ICD-10-CM

## 2015-09-29 DIAGNOSIS — F419 Anxiety disorder, unspecified: Secondary | ICD-10-CM | POA: Diagnosis present

## 2015-09-29 DIAGNOSIS — E876 Hypokalemia: Secondary | ICD-10-CM | POA: Diagnosis present

## 2015-09-29 DIAGNOSIS — F329 Major depressive disorder, single episode, unspecified: Secondary | ICD-10-CM

## 2015-09-29 DIAGNOSIS — Z885 Allergy status to narcotic agent status: Secondary | ICD-10-CM

## 2015-09-29 DIAGNOSIS — Z8249 Family history of ischemic heart disease and other diseases of the circulatory system: Secondary | ICD-10-CM

## 2015-09-29 DIAGNOSIS — T887XXA Unspecified adverse effect of drug or medicament, initial encounter: Secondary | ICD-10-CM

## 2015-09-29 DIAGNOSIS — Z79899 Other long term (current) drug therapy: Secondary | ICD-10-CM

## 2015-09-29 DIAGNOSIS — N920 Excessive and frequent menstruation with regular cycle: Secondary | ICD-10-CM | POA: Diagnosis present

## 2015-09-29 DIAGNOSIS — Z87891 Personal history of nicotine dependence: Secondary | ICD-10-CM

## 2015-09-29 DIAGNOSIS — G2402 Drug induced acute dystonia: Principal | ICD-10-CM | POA: Diagnosis present

## 2015-09-29 DIAGNOSIS — T50995A Adverse effect of other drugs, medicaments and biological substances, initial encounter: Secondary | ICD-10-CM | POA: Diagnosis present

## 2015-09-29 DIAGNOSIS — Z90710 Acquired absence of both cervix and uterus: Secondary | ICD-10-CM

## 2015-09-29 DIAGNOSIS — Z888 Allergy status to other drugs, medicaments and biological substances status: Secondary | ICD-10-CM

## 2015-09-29 DIAGNOSIS — Z8542 Personal history of malignant neoplasm of other parts of uterus: Secondary | ICD-10-CM

## 2015-09-29 DIAGNOSIS — T50905A Adverse effect of unspecified drugs, medicaments and biological substances, initial encounter: Secondary | ICD-10-CM | POA: Diagnosis present

## 2015-09-29 DIAGNOSIS — T43505A Adverse effect of unspecified antipsychotics and neuroleptics, initial encounter: Secondary | ICD-10-CM

## 2015-09-29 DIAGNOSIS — E669 Obesity, unspecified: Secondary | ICD-10-CM | POA: Diagnosis present

## 2015-09-29 LAB — CBC WITH DIFFERENTIAL/PLATELET
Basophils Absolute: 0.1 K/uL (ref 0.0–0.1)
Basophils Relative: 1 %
Eosinophils Absolute: 0.2 K/uL (ref 0.0–0.7)
Eosinophils Relative: 2 %
HCT: 39.8 % (ref 36.0–46.0)
Hemoglobin: 13.9 g/dL (ref 12.0–15.0)
Lymphocytes Relative: 40 %
Lymphs Abs: 3.7 K/uL (ref 0.7–4.0)
MCH: 29.2 pg (ref 26.0–34.0)
MCHC: 34.9 g/dL (ref 30.0–36.0)
MCV: 83.6 fL (ref 78.0–100.0)
Monocytes Absolute: 0.7 K/uL (ref 0.1–1.0)
Monocytes Relative: 7 %
Neutro Abs: 4.7 K/uL (ref 1.7–7.7)
Neutrophils Relative %: 50 %
Platelets: 398 K/uL (ref 150–400)
RBC: 4.76 MIL/uL (ref 3.87–5.11)
RDW: 13.1 % (ref 11.5–15.5)
WBC: 9.4 K/uL (ref 4.0–10.5)

## 2015-09-29 LAB — RAPID URINE DRUG SCREEN, HOSP PERFORMED
Amphetamines: NOT DETECTED
Barbiturates: NOT DETECTED
Benzodiazepines: NOT DETECTED
Cocaine: NOT DETECTED
Opiates: NOT DETECTED
Tetrahydrocannabinol: NOT DETECTED

## 2015-09-29 LAB — URINALYSIS, ROUTINE W REFLEX MICROSCOPIC
Bilirubin Urine: NEGATIVE
Glucose, UA: NEGATIVE mg/dL
Hgb urine dipstick: NEGATIVE
Ketones, ur: NEGATIVE mg/dL
Leukocytes, UA: NEGATIVE
Nitrite: NEGATIVE
Protein, ur: NEGATIVE mg/dL
Specific Gravity, Urine: 1.016 (ref 1.005–1.030)
pH: 5.5 (ref 5.0–8.0)

## 2015-09-29 LAB — SALICYLATE LEVEL: Salicylate Lvl: <4 mg/dL (ref 2.8–30.0)

## 2015-09-29 LAB — COMPREHENSIVE METABOLIC PANEL WITH GFR
ALT: 95 U/L — ABNORMAL HIGH (ref 14–54)
AST: 75 U/L — ABNORMAL HIGH (ref 15–41)
Albumin: 4.2 g/dL (ref 3.5–5.0)
Alkaline Phosphatase: 99 U/L (ref 38–126)
Anion gap: 12 (ref 5–15)
BUN: 15 mg/dL (ref 6–20)
CO2: 26 mmol/L (ref 22–32)
Calcium: 9.2 mg/dL (ref 8.9–10.3)
Chloride: 92 mmol/L — ABNORMAL LOW (ref 101–111)
Creatinine, Ser: 0.98 mg/dL (ref 0.44–1.00)
GFR calc Af Amer: >60 mL/min
GFR calc non Af Amer: >60 mL/min
Glucose, Bld: 138 mg/dL — ABNORMAL HIGH (ref 65–99)
Potassium: 2.4 mmol/L — CL (ref 3.5–5.1)
Sodium: 130 mmol/L — ABNORMAL LOW (ref 135–145)
Total Bilirubin: 0.5 mg/dL (ref 0.3–1.2)
Total Protein: 8.4 g/dL — ABNORMAL HIGH (ref 6.5–8.1)

## 2015-09-29 LAB — ACETAMINOPHEN LEVEL: Acetaminophen (Tylenol), Serum: <10 ug/mL — ABNORMAL LOW (ref 10–30)

## 2015-09-29 LAB — PREGNANCY, URINE: Preg Test, Ur: NEGATIVE

## 2015-09-29 LAB — ETHANOL: Alcohol, Ethyl (B): <5 mg/dL

## 2015-09-29 LAB — MAGNESIUM: Magnesium: 2.2 mg/dL (ref 1.7–2.4)

## 2015-09-29 LAB — CK: Total CK: 107 U/L (ref 38–234)

## 2015-09-29 MED ORDER — BUSPIRONE HCL 5 MG PO TABS
10.0000 mg | ORAL_TABLET | Freq: Three times a day (TID) | ORAL | Status: DC
  Administered 2015-09-30 – 2015-10-02 (×8): 10 mg via ORAL
  Filled 2015-09-29 (×8): qty 2

## 2015-09-29 MED ORDER — POTASSIUM CHLORIDE CRYS ER 20 MEQ PO TBCR
40.0000 meq | EXTENDED_RELEASE_TABLET | Freq: Three times a day (TID) | ORAL | Status: AC
  Administered 2015-09-30 – 2015-10-01 (×6): 40 meq via ORAL
  Filled 2015-09-29 (×6): qty 2

## 2015-09-29 MED ORDER — ONDANSETRON HCL 4 MG PO TABS: 4.0000 mg | ORAL_TABLET | Freq: Four times a day (QID) | ORAL | Status: DC | PRN

## 2015-09-29 MED ORDER — SERTRALINE HCL 50 MG PO TABS
25.0000 mg | ORAL_TABLET | Freq: Every day | ORAL | Status: DC
  Administered 2015-09-30: 25 mg via ORAL
  Filled 2015-09-29: qty 1

## 2015-09-29 MED ORDER — HYDROMORPHONE HCL 1 MG/ML IJ SOLN
0.5000 mg | Freq: Four times a day (QID) | INTRAMUSCULAR | Status: DC | PRN
  Administered 2015-09-30: 0.5 mg via INTRAVENOUS
  Filled 2015-09-29: qty 1

## 2015-09-29 MED ORDER — ONDANSETRON HCL 4 MG/2ML IJ SOLN: 4.0000 mg | Freq: Four times a day (QID) | INTRAMUSCULAR | Status: DC | PRN

## 2015-09-29 MED ORDER — POTASSIUM CHLORIDE IN NACL 20-0.9 MEQ/L-% IV SOLN
INTRAVENOUS | Status: DC
  Administered 2015-09-30 – 2015-10-01 (×5): via INTRAVENOUS
  Filled 2015-09-29 (×6): qty 1000

## 2015-09-29 MED ORDER — ENOXAPARIN SODIUM 40 MG/0.4ML ~~LOC~~ SOLN: 40.0000 mg | SUBCUTANEOUS | Status: DC

## 2015-09-29 MED ORDER — DULOXETINE HCL 60 MG PO CPEP
60.0000 mg | ORAL_CAPSULE | Freq: Two times a day (BID) | ORAL | Status: DC
  Administered 2015-09-30 – 2015-10-02 (×6): 60 mg via ORAL
  Filled 2015-09-29 (×6): qty 1

## 2015-09-29 MED ORDER — DIPHENHYDRAMINE HCL 50 MG/ML IJ SOLN: 12.5000 mg | Freq: Once | INTRAMUSCULAR | Status: DC

## 2015-09-29 MED ORDER — PANTOPRAZOLE SODIUM 40 MG PO TBEC
40.0000 mg | DELAYED_RELEASE_TABLET | Freq: Every day | ORAL | Status: DC
  Administered 2015-09-30 – 2015-10-02 (×3): 40 mg via ORAL
  Filled 2015-09-29 (×3): qty 1

## 2015-09-29 MED ORDER — POTASSIUM CHLORIDE CRYS ER 20 MEQ PO TBCR
50.0000 meq | EXTENDED_RELEASE_TABLET | Freq: Once | ORAL | Status: AC
  Administered 2015-09-29: 50 meq via ORAL
  Filled 2015-09-29: qty 1

## 2015-09-29 MED ORDER — POTASSIUM CHLORIDE 10 MEQ/100ML IV SOLN
10.0000 meq | Freq: Once | INTRAVENOUS | Status: AC
  Administered 2015-09-29: 10 meq via INTRAVENOUS
  Filled 2015-09-29: qty 100

## 2015-09-29 MED ORDER — SULFAMETHOXAZOLE-TRIMETHOPRIM 800-160 MG PO TABS
1.0000 | ORAL_TABLET | Freq: Two times a day (BID) | ORAL | Status: DC
  Administered 2015-09-30 – 2015-10-02 (×6): 1 via ORAL
  Filled 2015-09-29 (×6): qty 1

## 2015-09-29 MED ORDER — TOPIRAMATE 25 MG PO TABS
50.0000 mg | ORAL_TABLET | Freq: Two times a day (BID) | ORAL | Status: DC
  Administered 2015-09-30 – 2015-10-02 (×6): 50 mg via ORAL
  Filled 2015-09-29 (×6): qty 2

## 2015-09-29 MED ORDER — LORAZEPAM 1 MG PO TABS
1.0000 mg | ORAL_TABLET | Freq: Once | ORAL | Status: AC
  Administered 2015-09-29: 1 mg via ORAL
  Filled 2015-09-29: qty 1

## 2015-09-29 MED ORDER — BENZTROPINE MESYLATE 1 MG/ML IJ SOLN
2.0000 mg | INTRAMUSCULAR | Status: AC
  Administered 2015-09-29: 2 mg via INTRAVENOUS
  Filled 2015-09-29: qty 2

## 2015-09-29 NOTE — ED Notes (Signed)
Patient states for the past 3 months Sheryl Mathis has been adjusting her medications and recently discontinued one antidepressant to begin another.  Patient states several days ago she began "twitching" in her arms and neck.  Patient called Sheryl Mathis and saw them Tuesday.  Patient was started on Zoloft, but she continues to have tremors and stuttering.  On exam, patient is alert & oriented with somewhat flat affect.  Speech is forced with some stutter. No tremors noted.  Patient denies SI/HI.

## 2015-09-29 NOTE — ED Triage Notes (Signed)
Patient has been at behavioral health and put on medications but had to have 3 different medication changes due to adverse reaction/side effects of shaking.  Patient went to [facility] on Tuesday and was seen and told to stop the second medication and started her on Zoloft.  Patient was started on medication for shaking. Shaking isnt any better and now stuttering.

## 2015-09-29 NOTE — ED Provider Notes (Signed)
[facility] DEPT Provider Note   CSN: [id number] Arrival date & time: 09/29/15  1548  First Provider Contact:  None       History   Chief Complaint Chief Complaint  Patient presents with  . Medication Reaction    HPI Sheryl Mathis is a 45 y.o. female with a past medical history of endometrial cancer status post hysterectomy, anxiety, depression who presented to the ED today complaining of possible medication reaction. Patient states that she was admitted to behavioral health in January of this year for depression. She was placed on several new medications including Prozac. Patient states that Prozac made her paranoid to discontinue this medication. They then placed her on Rexulti approximately 1 month ago. Pt was doing well on this medication until 1 week ago when she developed shaking and trembling of her hands and neck as well as stuttering speech. Pt saw her psychiatrist on Tuesday who stopped the [place] and placed her on Zoloft as well as Cogentin for the shaking. Pt states that her symptoms have gotten worse since then. She denies and SI/HI/hallucinations.  HPI  Past Medical History:  Diagnosis Date  . Anemia   . Anxiety   . Cancer [facility])    ENDOMETRIAL CANCER (tx completed 05/2013)  . Complication of anesthesia    PT STATES SHE WOKE UP AFTER D&C [place].  STATES NO PROBLEMS WAKING UP AFTER HER HYSTERECTOMY  . Depression   . Headache(784.0)    MIGRAINES  . History of radiation therapy 04/23/13, 04/29/13, 05/07/13, 05/14/13, 05/21/13   30 Gy to proximal vagina  . Menorrhagia   . Migraines   . Neuropathy [facility])     Patient Active Problem List   Diagnosis Date Noted  . Severe episode of recurrent major depressive disorder, without psychotic features ([place])   . MDD (major depressive disorder), recurrent episode, severe ([place]) 03/01/2015  . Vasomotor flushing 09/02/2014  . Vaginal candidiasis 09/02/2014  . Panic disorder 04/25/2014  . MDD (major  depressive disorder), recurrent severe, without psychosis ([place]) 04/24/2014  . Obesity (BMI 30-39.9) 02/01/2014  . Uterine cancer ([place]) 03/31/2013  . Endometrial carcinoma ([place]) 02/02/2013  . S/P laparoscopic assisted vaginal hysterectomy (LAVH) and bilateral salpingectomy (BS) on 01/22/13 01/22/2013    Past Surgical History:  Procedure Laterality Date  . CESAREAN SECTION     X2  . CHOLECYSTECTOMY    . DILITATION & CURRETTAGE/HYSTROSCOPY WITH HYDROTHERMAL ABLATION N/A 01/01/2013   Procedure: DILATATION & CURETTAGE/HYSTEROSCOPY WITH ATTEMPTED HYDROTHERMAL ABLATION: Change over to [place] @ 6546;  Surgeon: Osborne Oman, MD;  Location: [facility];  Service: Gynecology;  Laterality: N/A;  . LAPAROSCOPIC ASSISTED VAGINAL HYSTERECTOMY N/A 01/22/2013   Procedure: LAPAROSCOPIC ASSISTED VAGINAL HYSTERECTOMY with Bilateral Fallopian Tubes and Ovaries;  Surgeon: Osborne Oman, MD;  Location: [facility];  Service: Gynecology;  Laterality: N/A;  . ROBOTIC ASSISTED TOTAL HYSTERECTOMY WITH BILATERAL SALPINGO OOPHERECTOMY Bilateral 03/31/2013   Procedure: ROBOTIC ASSISTED BILATERAL OOPHORECTOMY PELVIC AND POSSIBLE PARA-AORTIC LYMPH NODE DISSECTION;  Surgeon: Imagene Gurney A. Alycia Rossetti, MD;  Location: [facility];  Service: Gynecology;  Laterality: Bilateral;  . TUBAL LIGATION      OB History    Gravida Para Term Preterm AB Living   '2 2 2     2   '$ SAB TAB Ectopic Multiple Live Births                   Home Medications    Prior to Admission medications   Medication  Sig Start Date End Date Taking? Authorizing Provider  benztropine (COGENTIN) 0.5 MG tablet Take 0.5 mg by mouth 2 (two) times daily.   Yes Historical Provider, MD  busPIRone (BUSPAR) 10 MG tablet Take 10 mg by mouth 3 (three) times daily.   Yes Historical Provider, MD  DULoxetine (CYMBALTA) 60 MG capsule Take 1 capsule (60 mg total) by mouth 2 (two) times daily. For depression 03/07/15  Yes Encarnacion Slates, NP  gabapentin (NEURONTIN) 300 MG  capsule Take 300 mg by mouth 3 (three) times daily.   Yes Historical Provider, MD  hydrOXYzine (ATARAX/VISTARIL) 25 MG tablet Take 1 tablet (25 mg total) by mouth every 6 (six) hours as needed for anxiety. Patient taking differently: Take 25-50 mg by mouth at bedtime as needed for anxiety.  03/07/15  Yes Encarnacion Slates, NP  ibuprofen (ADVIL,MOTRIN) 800 MG tablet Take 800 mg by mouth every 8 (eight) hours as needed for headache, mild pain or moderate pain.   Yes Historical Provider, MD  loratadine (CLARITIN) 10 MG tablet Take 1 tablet (10 mg total) by mouth daily. For allergies 03/07/15  Yes Encarnacion Slates, NP  omeprazole (PRILOSEC) 20 MG capsule Take 1 capsule (20 mg total) by mouth daily. For acid reflux 03/07/15  Yes Encarnacion Slates, NP  promethazine (PHENERGAN) 25 MG tablet Take 25 mg by mouth every 6 (six) hours as needed for nausea or vomiting.   Yes Historical Provider, MD  sertraline (ZOLOFT) 25 MG tablet Take 25 mg by mouth daily.   Yes Historical Provider, MD  sulfamethoxazole-trimethoprim (BACTRIM DS,SEPTRA DS) 800-160 MG tablet Take 1 tablet by mouth 2 (two) times daily. 09/27/15 10/04/15 Yes Hollace Kinnier Sofia, PA-C  topiramate (TOPAMAX) 25 MG tablet Take 2 tablets (50 mg total) by mouth 2 (two) times daily. For migraine headache pain 03/07/15  Yes Encarnacion Slates, NP  fluconazole (DIFLUCAN) 100 MG tablet Take 1 tablet (100 mg total) by mouth once. Patient not taking: Reported on 09/29/2015 06/02/15   Dorothyann Gibbs, NP  HYDROcodone-acetaminophen (NORCO/VICODIN) 5-325 MG tablet Take 2 tablets by mouth every 4 (four) hours as needed. Patient not taking: Reported on 09/29/2015 09/27/15   Fransico Meadow, PA-C  ibuprofen (ADVIL,MOTRIN) 600 MG tablet Take 1 tablet (600 mg total) by mouth every 6 (six) hours as needed for moderate pain. Patient not taking: Reported on 09/29/2015 03/07/15   Encarnacion Slates, NP    Family History Family History  Problem Relation Age of Onset  . Diabetes Mother   . Hypertension Mother     . Cancer Neg Hx     Social History Social History  Substance Use Topics  . Smoking status: Former Smoker    Packs/day: 0.50    Years: 26.00    Types: Cigarettes    Quit date: 08/03/2013  . Smokeless tobacco: Never Used  . Alcohol use No     Allergies   Fluoxetine; Hydrocodone-acetaminophen; and Rexulti [brexpiprazole]   Review of Systems Review of Systems  All other systems reviewed and are negative.    Physical Exam Updated Vital Signs BP 135/88 (BP Location: Left Arm)   Pulse 89   Resp 17   LMP 11/03/2012   SpO2 93%   Physical Exam  Constitutional: She is oriented to person, place, and time. She appears well-developed and well-nourished. No distress.  Obese  HENT:  Head: Normocephalic and atraumatic.  Mouth/Throat: No oropharyngeal exudate.  Eyes: Conjunctivae and EOM are normal. Pupils are equal, round, and reactive  to light. Right eye exhibits no discharge. Left eye exhibits no discharge. No scleral icterus.  Cardiovascular: Normal rate, regular rhythm, normal heart sounds and intact distal pulses.  Exam reveals no gallop and no friction rub.   No murmur heard. Pulmonary/Chest: Effort normal and breath sounds normal. No respiratory distress. She has no wheezes. She has no rales. She exhibits no tenderness.  Abdominal: Soft. She exhibits no distension. There is no tenderness. There is no guarding.  Musculoskeletal: Normal range of motion. She exhibits no edema.  Neurological: She is alert and oriented to person, place, and time. She exhibits normal muscle tone. Coordination normal.  Pt with consistent twitching of left arm and bobbing of head. Pt also has stammer and stutter of speech. No slurred speech. She is alert and oriented x 4. No dysmetria.   Skin: Skin is warm and dry. No rash noted. She is not diaphoretic. No erythema. No pallor.  Psychiatric: She has a normal mood and affect. Her behavior is normal.  Nursing note and vitals reviewed.    ED  Treatments / Results  Labs (all labs ordered are listed, but only abnormal results are displayed) Labs Reviewed  COMPREHENSIVE METABOLIC PANEL - Abnormal; Notable for the following:       Result Value   Sodium 130 (*)    Potassium 2.4 (*)    Chloride 92 (*)    Glucose, Bld 138 (*)    Total Protein 8.4 (*)    AST 75 (*)    ALT 95 (*)    All other components within normal limits  ACETAMINOPHEN LEVEL - Abnormal; Notable for the following:    Acetaminophen (Tylenol), Serum <10 (*)    All other components within normal limits  SALICYLATE LEVEL  ETHANOL  CBC WITH DIFFERENTIAL/PLATELET  PREGNANCY, URINE  URINALYSIS, ROUTINE W REFLEX MICROSCOPIC (NOT AT [facility])  URINE RAPID DRUG SCREEN, HOSP PERFORMED  CK  MAGNESIUM  BASIC METABOLIC PANEL    EKG  EKG Interpretation  Date/Time:  Thursday September 29 2015 19:16:32 EDT Ventricular Rate:  75 PR Interval:    QRS Duration: 99 QT Interval:  402 QTC Calculation: 449 R Axis:   16 Text Interpretation:  Sinus rhythm Low voltage, precordial leads Confirmed by Johnney Killian, MD, Jeannie Done [phone]) on 09/30/2015 12:56:44 AM       Radiology No results found.  Procedures Procedures (including critical care time)  Medications Ordered in ED Medications  LORazepam (ATIVAN) tablet 1 mg (not administered)     Initial Impression / Assessment and Plan / ED Course  I have reviewed the triage vital signs and the nursing notes.  Pertinent labs & imaging results that were available during my care of the patient were reviewed by me and considered in my medical decision making (see chart for details).  Pt presents to the ED with what appears to be severe extrapyramidal symptoms from [place] that she recently stopped 2 days ago that has been unresponsive to Cogentin. Pt's head is bobbing and she has significant stutter. Pt also found to be hypokalemic with a potassium of 2.4, repleted in ED. However, pt has never been hypokalemic before. No EKG changes. Feel  that pt would benefit from observation overnight for metabolic panel monitoring with psych consult in the morning for medication adjustment. CK wnl. Will check magnesium as well.   10:06 PM Spoke with Dr. Jonnie Finner who will consult pt in ED.  Case discussed with Dr. Johnney Killian who agrees with treatment plan.  Clinical Course  Value Comment  By Time  Potassium: (!!) 2.4 Repleted in ED. Pt given 56mq IV and 50 meq PO. Will order CK to r/o rhabdo as well. SDondra SpryDSinclairville PA-C 07/27 2113     Final Clinical Impressions(s) / ED Diagnoses   Final diagnoses:  Medication reaction, initial encounter    New Prescriptions New Prescriptions   No medications on file     Sheryl Levering PA-C 09/30/15 0057    MCharlesetta Shanks MD 10/06/15 2330

## 2015-09-29 NOTE — ED Notes (Signed)
Spoke with doctor wants cogentin stat calling pharmacy

## 2015-09-29 NOTE — H&P (Signed)
[facility] History and Physical  Sheryl Mathis GGE:[id number] DOB: 08-19-1970 DOA: 09/29/2015  Referring physician: Dr Johnney Killian PCP: Pcp Not In System   Chief Complaint: Side effects of new medication  HPI: Sheryl Mathis is a 45 y.o. female w hx depression, endometrial cancer who recently had medication changes (psych medications) and presenting with abnormal uncontrollable movements including tremors, stuttering speech and "twitching" of her arms and neck. Her adopted mother and the patient provide the history.  She has long hx psych issues and didn't respond to Prozac according to the mother so about 4-5 weeks ago she was started on a new medication called Rexulti.  She "did well" on it for about 3 weeks, but then last week started to have tremors of her arms and her neck.  This got worse and started to have difficulty speaking w stuttering and so on Sunday 4 days ago she called [facility] and they told her to stop the medication, Rexulti.  She didn't get better and so went in on Tuesday and was given cogentin and started on Zoloft.  She took the new meds but isn't getting better so came to ED.    She denies any fever, chills, abd pain, no n/v/d, no CP, no SOB.  No HA or paralysis.    Chart review: 2014 D&C for menorrhagia Hysterectomy, bilat salpingectomy, LOA 2015 Endometrial carcinoma/ cancer, underwent bilat oophorectomy, pelvic and poss para-aortic LN dissection.  Stage 1A grade 2, treated with surgery and then vag cuff brachytherapy completed Mar 2015. F/B GYN Onc every 3-6 mos.  2016 Major depression admission, recurrent severe w/o psychosis Major depression admission  ROS  denies CP  no joint pain   no HA  no blurry vision  no rash  no diarrhea  no nausea/ vomiting  no dysuria  no difficulty voiding  no change in urine color    Past Medical History  Past Medical History:  Diagnosis Date  . Anemia   . Anxiety   . Cancer [facility])    ENDOMETRIAL CANCER (tx completed  05/2013)  . Complication of anesthesia    PT STATES SHE WOKE UP AFTER D&C [place].  STATES NO PROBLEMS WAKING UP AFTER HER HYSTERECTOMY  . Depression   . Headache(784.0)    MIGRAINES  . History of radiation therapy 04/23/13, 04/29/13, 05/07/13, 05/14/13, 05/21/13   30 Gy to proximal vagina  . Menorrhagia   . Migraines   . Neuropathy [facility])    Past Surgical History  Past Surgical History:  Procedure Laterality Date  . CESAREAN SECTION     X2  . CHOLECYSTECTOMY    . DILITATION & CURRETTAGE/HYSTROSCOPY WITH HYDROTHERMAL ABLATION N/A 01/01/2013   Procedure: DILATATION & CURETTAGE/HYSTEROSCOPY WITH ATTEMPTED HYDROTHERMAL ABLATION: Change over to [place] @ 4650;  Surgeon: Osborne Oman, MD;  Location: [facility];  Service: Gynecology;  Laterality: N/A;  . LAPAROSCOPIC ASSISTED VAGINAL HYSTERECTOMY N/A 01/22/2013   Procedure: LAPAROSCOPIC ASSISTED VAGINAL HYSTERECTOMY with Bilateral Fallopian Tubes and Ovaries;  Surgeon: Osborne Oman, MD;  Location: [facility];  Service: Gynecology;  Laterality: N/A;  . ROBOTIC ASSISTED TOTAL HYSTERECTOMY WITH BILATERAL SALPINGO OOPHERECTOMY Bilateral 03/31/2013   Procedure: ROBOTIC ASSISTED BILATERAL OOPHORECTOMY PELVIC AND POSSIBLE PARA-AORTIC LYMPH NODE DISSECTION;  Surgeon: Imagene Gurney A. Alycia Rossetti, MD;  Location: [facility];  Service: Gynecology;  Laterality: Bilateral;  . TUBAL LIGATION     Family History  Family History  Problem Relation Age of Onset  . Diabetes Mother   .  Hypertension Mother   . Cancer Neg Hx    Social History  reports that she quit smoking about 2 years ago. Her smoking use included Cigarettes. She has a 13.00 pack-year smoking history. She has never used smokeless tobacco. She reports that she does not drink alcohol or use drugs. Allergies  Allergies  Allergen Reactions  . Fluoxetine Other (See Comments)    Paranoia, altered mental status  . Hydrocodone-Acetaminophen Other (See Comments)    Too strong  .  Rexulti [Brexpiprazole] Other (See Comments)    Shakes, stutters   Home medications Prior to Admission medications   Medication Sig Start Date End Date Taking? Authorizing Provider  benztropine (COGENTIN) 0.5 MG tablet Take 0.5 mg by mouth 2 (two) times daily.   Yes Historical Provider, MD  busPIRone (BUSPAR) 10 MG tablet Take 10 mg by mouth 3 (three) times daily.   Yes Historical Provider, MD  DULoxetine (CYMBALTA) 60 MG capsule Take 1 capsule (60 mg total) by mouth 2 (two) times daily. For depression 03/07/15  Yes Encarnacion Slates, NP  gabapentin (NEURONTIN) 300 MG capsule Take 300 mg by mouth 3 (three) times daily.   Yes Historical Provider, MD  hydrOXYzine (ATARAX/VISTARIL) 25 MG tablet Take 1 tablet (25 mg total) by mouth every 6 (six) hours as needed for anxiety. Patient taking differently: Take 25-50 mg by mouth at bedtime as needed for anxiety.  03/07/15  Yes Encarnacion Slates, NP  ibuprofen (ADVIL,MOTRIN) 800 MG tablet Take 800 mg by mouth every 8 (eight) hours as needed for headache, mild pain or moderate pain.   Yes Historical Provider, MD  loratadine (CLARITIN) 10 MG tablet Take 1 tablet (10 mg total) by mouth daily. For allergies 03/07/15  Yes Encarnacion Slates, NP  omeprazole (PRILOSEC) 20 MG capsule Take 1 capsule (20 mg total) by mouth daily. For acid reflux 03/07/15  Yes Encarnacion Slates, NP  promethazine (PHENERGAN) 25 MG tablet Take 25 mg by mouth every 6 (six) hours as needed for nausea or vomiting.   Yes Historical Provider, MD  sertraline (ZOLOFT) 25 MG tablet Take 25 mg by mouth daily.   Yes Historical Provider, MD  sulfamethoxazole-trimethoprim (BACTRIM DS,SEPTRA DS) 800-160 MG tablet Take 1 tablet by mouth 2 (two) times daily. 09/27/15 10/04/15 Yes Hollace Kinnier Sofia, PA-C  topiramate (TOPAMAX) 25 MG tablet Take 2 tablets (50 mg total) by mouth 2 (two) times daily. For migraine headache pain 03/07/15  Yes Encarnacion Slates, NP  fluconazole (DIFLUCAN) 100 MG tablet Take 1 tablet (100 mg total) by mouth  once. Patient not taking: Reported on 09/29/2015 06/02/15   Dorothyann Gibbs, NP  HYDROcodone-acetaminophen (NORCO/VICODIN) 5-325 MG tablet Take 2 tablets by mouth every 4 (four) hours as needed. Patient not taking: Reported on 09/29/2015 09/27/15   Fransico Meadow, PA-C  ibuprofen (ADVIL,MOTRIN) 600 MG tablet Take 1 tablet (600 mg total) by mouth every 6 (six) hours as needed for moderate pain. Patient not taking: Reported on 09/29/2015 03/07/15   Encarnacion Slates, NP   Liver Function Tests  Recent Labs Lab 09/29/15 1900  AST 75*  ALT 95*  ALKPHOS 99  BILITOT 0.5  PROT 8.4*  ALBUMIN 4.2   No results for input(s): LIPASE, AMYLASE in the last 168 hours. CBC  Recent Labs Lab 09/29/15 1900  WBC 9.4  NEUTROABS 4.7  HGB 13.9  HCT 39.8  MCV 83.6  PLT 062   Basic Metabolic Panel  Recent Labs Lab 09/29/15 1900  NA  130*  K 2.4*  CL 92*  CO2 26  GLUCOSE 138*  BUN 15  CREATININE 0.98  CALCIUM 9.2     Vitals:   09/29/15 1609 09/29/15 1848 09/29/15 2053 09/29/15 2100  BP: (!) 141/112 135/88 166/69 134/92  Pulse: 108 89 83 82  Resp: '17 17 20 17  '$ SpO2: 100% 93% 96% 98%   Exam: Gen pleasant WF, tremors/ spasm of neck and oral/ facial muscles, stuttering speech and some tremors of arms No rash, cyanosis or gangrene Sclera anicteric, throat clear No jvd or bruits Chest clear bilat RRR no MRG Abd soft ntnd no mass or ascites +bs GU defer MS no joint effusions or deformity Ext no LE or UE edema / no wounds or ulcers Neuro as above, no motor deficits, good sensation   Na 130  K 2.4  BUN 16  Cr 0.98   Glu 138  Alb 4.2 AST 75 ^, ALT 95^  Tbili 0.5    WBC 9k  Hb 13  plt 325] Tylenol/ salicylate levels wnl UA neg URine preg neg etoh neg UDS neg  EKG (independ reviewed) > NSR, no acute   Assessment: 1.  Acute dystonic reaction - to Rexulti, a new atypical antipsychotic agents, stopped 4-5 days ago.  It has long half-life 4 days.  D/W pharm, not responding to po Cogentin  0.5 bid, recommends IV Cogentin '2mg'$  now then 1-2 mg (po or IV) bid until a good dose is determined then should take it for about a month by mouth.  Consdier also benadryl 50 mg IV daily depending on response to IV Cogentin.  DC phenergan.  2.  Chronic severe depression 3.  Hx uterine cancer - rx w surgery/ brachytherapy  4.  Hypokalemia- replace  Plan - as above     Sol Blazing [facility] Pager [phone]  Cell [phone]  If 7PM-7AM, please contact night-coverage [email or web address] Password [facility] 09/29/2015, 10:01 PM

## 2015-09-30 DIAGNOSIS — G2402 Drug induced acute dystonia: Principal | ICD-10-CM

## 2015-09-30 DIAGNOSIS — F332 Major depressive disorder, recurrent severe without psychotic features: Secondary | ICD-10-CM

## 2015-09-30 LAB — BASIC METABOLIC PANEL
ANION GAP: 8 (ref 5–15)
BUN: 12 mg/dL (ref 6–20)
CALCIUM: 8.7 mg/dL — AB (ref 8.9–10.3)
CO2: 27 mmol/L (ref 22–32)
CREATININE: 0.85 mg/dL (ref 0.44–1.00)
Chloride: 101 mmol/L (ref 101–111)
Glucose, Bld: 148 mg/dL — ABNORMAL HIGH (ref 65–99)
Potassium: 2.9 mmol/L — ABNORMAL LOW (ref 3.5–5.1)
Sodium: 136 mmol/L (ref 135–145)

## 2015-09-30 MED ORDER — BENZTROPINE MESYLATE 0.5 MG PO TABS: 1.0000 mg | ORAL_TABLET | Freq: Two times a day (BID) | ORAL | Status: DC

## 2015-09-30 MED ORDER — ENOXAPARIN SODIUM 60 MG/0.6ML ~~LOC~~ SOLN
60.0000 mg | Freq: Every day | SUBCUTANEOUS | Status: DC
  Filled 2015-09-30: qty 0.6

## 2015-09-30 MED ORDER — BENZTROPINE MESYLATE 1 MG/ML IJ SOLN
1.0000 mg | Freq: Two times a day (BID) | INTRAMUSCULAR | Status: DC
  Administered 2015-09-30 (×2): 1 mg via INTRAVENOUS
  Filled 2015-09-30 (×3): qty 1

## 2015-09-30 MED ORDER — CLONAZEPAM 0.5 MG PO TABS
0.2500 mg | ORAL_TABLET | Freq: Two times a day (BID) | ORAL | Status: DC | PRN
  Administered 2015-09-30: 0.25 mg via ORAL
  Filled 2015-09-30: qty 1

## 2015-09-30 MED ORDER — IBUPROFEN 200 MG PO TABS
200.0000 mg | ORAL_TABLET | Freq: Four times a day (QID) | ORAL | Status: DC | PRN
  Administered 2015-09-30 – 2015-10-02 (×4): 400 mg via ORAL
  Filled 2015-09-30 (×5): qty 2

## 2015-09-30 MED ORDER — CEPASTAT 14.5 MG MT LOZG
1.0000 | LOZENGE | OROMUCOSAL | Status: DC | PRN
  Administered 2015-09-30: 1 via BUCCAL
  Filled 2015-09-30 (×2): qty 9

## 2015-09-30 MED ORDER — BENZTROPINE MESYLATE 1 MG/ML IJ SOLN
1.0000 mg | Freq: Two times a day (BID) | INTRAMUSCULAR | Status: DC
  Filled 2015-09-30: qty 1

## 2015-09-30 MED ORDER — DIPHENHYDRAMINE HCL 50 MG/ML IJ SOLN
12.5000 mg | Freq: Four times a day (QID) | INTRAMUSCULAR | Status: DC | PRN
  Administered 2015-09-30: 25 mg via INTRAVENOUS
  Filled 2015-09-30: qty 1

## 2015-09-30 MED ORDER — GABAPENTIN 400 MG PO CAPS
400.0000 mg | ORAL_CAPSULE | Freq: Three times a day (TID) | ORAL | Status: DC
  Administered 2015-09-30 – 2015-10-02 (×6): 400 mg via ORAL
  Filled 2015-09-30 (×6): qty 1

## 2015-09-30 MED ORDER — DIPHENHYDRAMINE HCL 50 MG/ML IJ SOLN
12.5000 mg | Freq: Four times a day (QID) | INTRAMUSCULAR | Status: DC | PRN
  Administered 2015-09-30: 12.5 mg via INTRAVENOUS
  Filled 2015-09-30: qty 1

## 2015-09-30 MED ORDER — CLONAZEPAM 0.5 MG PO TABS
0.5000 mg | ORAL_TABLET | Freq: Three times a day (TID) | ORAL | Status: DC
  Administered 2015-09-30 – 2015-10-02 (×6): 0.5 mg via ORAL
  Filled 2015-09-30 (×6): qty 1

## 2015-09-30 MED ORDER — ENOXAPARIN SODIUM 80 MG/0.8ML ~~LOC~~ SOLN
0.5000 mg/kg | Freq: Every day | SUBCUTANEOUS | Status: DC
  Administered 2015-09-30 – 2015-10-02 (×3): 65 mg via SUBCUTANEOUS
  Filled 2015-09-30 (×3): qty 0.8

## 2015-09-30 NOTE — Progress Notes (Signed)
Brief Pharmacy Note: Enoxaparin  Clarification: Patient's actual weight documented as 130.7 kg. BMI ~ 45, CrCl > 100 ml/min. CBC WNL. Dose for Enoxaparin will be adjusted to 65 mg (0.5 mg/kg) SQ q24h for BMI > 30, CrCl > 30 ml/min.   Lindell Spar, PharmD, BCPS Pager: [phone] 09/30/2015 8:23 AM

## 2015-09-30 NOTE — Progress Notes (Signed)
PROGRESS NOTE  Sheryl Mathis KGM:[id number] DOB: 1970-05-04 DOA: 09/29/2015 PCP: Pcp Not In System   LOS: 0 days   Brief Narrative: 45 y.o. female w hx depression, endometrial cancer who recently had medication changes (psych medications) and presenting with abnormal uncontrollable movements including tremors, stuttering speech and "twitching" of her arms and neck. 4-5 weeks ago she was started on a new medication called Rexulti, and 3-4 weeks afterwards started having tremors of her arms and back, cold [facility] and this medication was discontinued 4 days prior to admission. She was also placed on Cogentin.  Assessment & Plan: Principal Problem:   Acute neuroleptic-induced dystonia Active Problems:   History of uterine cancer   MDD (major depressive disorder), recurrent severe, without psychosis ([place])   Medication reaction   Acute dystonia due to drugs   Acute dystonic reaction  - to Rexulti, a new atypical antipsychotic agents, stopped 4-5 days ago.  - It has long half-life 4 days.  - Patient received IV Cogentin 2 mg now, place on 1 mg twice daily IV - Add low-dose Benadryl - Neurology as well as psychiatry consulted. Appreciate input.  Chronic severe depression  - psychiatry consulted, appreciate input  Hx uterine cancer  - rx w surgery/ brachytherapy   Hypokalemia - replace - repeat BMP in am    DVT prophylaxis: Lovenox Code Status: Full Family Communication: no family bedside Disposition Plan: home when ready   Consultants:   Neurology  Psychiatry  Procedures:   None  Antimicrobials:  None   Subjective: - no chest pain, shortness of breath, no abdominal pain, nausea or vomiting.  - very scared regarding her neuro symptoms. Stuttering   Objective: Vitals:   09/29/15 2053 09/29/15 2100 09/29/15 2345 09/30/15 0500  BP: 166/69 134/92 (!) 150/95 116/74  Pulse: 83 82 72 80  Resp: '20 17 20 20  '$ Temp:   97.7 F (36.5 C) 97.8 F (36.6 C)  TempSrc:   Oral  Oral  SpO2: 96% 98% 100% 96%  Weight:   130.7 kg (288 lb 2.3 oz)   Height:   '5\' 7"'$  (1.702 m)     Intake/Output Summary (Last 24 hours) at 09/30/15 1037 Last data filed at 09/30/15 0912  Gross per 24 hour  Intake              640 ml  Output             2625 ml  Net            -1985 ml   Filed Weights   09/29/15 2345  Weight: 130.7 kg (288 lb 2.3 oz)    Examination: Constitutional: NAD Vitals:   09/29/15 2053 09/29/15 2100 09/29/15 2345 09/30/15 0500  BP: 166/69 134/92 (!) 150/95 116/74  Pulse: 83 82 72 80  Resp: '20 17 20 20  '$ Temp:   97.7 F (36.5 C) 97.8 F (36.6 C)  TempSrc:   Oral Oral  SpO2: 96% 98% 100% 96%  Weight:   130.7 kg (288 lb 2.3 oz)   Height:   '5\' 7"'$  (1.702 m)    Eyes: PERRL Respiratory: clear to auscultation bilaterally, no wheezing, no crackles. Cardiovascular: Regular rate and rhythm, no murmurs / rubs / gallops.  Abdomen: no tenderness. Bowel sounds positive.  Musculoskeletal: no clubbing / cyanosis. Neurologic: non focal, stuttering speech, arm tremors.  Psychiatric: Normal judgment and insight. Alert and oriented x 3.    Data Reviewed: I have personally reviewed following labs and imaging studies  CBC:  Recent Labs Lab 09/29/15 1900  WBC 9.4  NEUTROABS 4.7  HGB 13.9  HCT 39.8  MCV 83.6  PLT 761   Basic Metabolic Panel:  Recent Labs Lab 09/29/15 1900 09/30/15 0543  NA 130* 136  K 2.4* 2.9*  CL 92* 101  CO2 26 27  GLUCOSE 138* 148*  BUN 15 12  CREATININE 0.98 0.85  CALCIUM 9.2 8.7*  MG 2.2  --    GFR: Estimated Creatinine Clearance: 118.9 mL/min (by C-G formula based on SCr of 0.85 mg/dL). Liver Function Tests:  Recent Labs Lab 09/29/15 1900  AST 75*  ALT 95*  ALKPHOS 99  BILITOT 0.5  PROT 8.4*  ALBUMIN 4.2   No results for input(s): LIPASE, AMYLASE in the last 168 hours. No results for input(s): AMMONIA in the last 168 hours. Coagulation Profile: No results for input(s): INR, PROTIME in the last 168  hours. Cardiac Enzymes:  Recent Labs Lab 09/29/15 1900  CKTOTAL 107   BNP (last 3 results) No results for input(s): PROBNP in the last 8760 hours. HbA1C: No results for input(s): HGBA1C in the last 72 hours. CBG: No results for input(s): GLUCAP in the last 168 hours. Lipid Profile: No results for input(s): CHOL, HDL, LDLCALC, TRIG, CHOLHDL, LDLDIRECT in the last 72 hours. Thyroid Function Tests: No results for input(s): TSH, T4TOTAL, FREET4, T3FREE, THYROIDAB in the last 72 hours. Anemia Panel: No results for input(s): VITAMINB12, FOLATE, FERRITIN, TIBC, IRON, RETICCTPCT in the last 72 hours. Urine analysis:    Component Value Date/Time   COLORURINE YELLOW 09/29/2015 [place] 09/29/2015 1851   LABSPEC 1.016 09/29/2015 1851   LABSPEC 1.010 12/16/2014 1408   PHURINE 5.5 09/29/2015 1851   GLUCOSEU NEGATIVE 09/29/2015 1851   GLUCOSEU Negative 12/16/2014 1408   HGBUR NEGATIVE 09/29/2015 1851   BILIRUBINUR NEGATIVE 09/29/2015 1851   BILIRUBINUR Negative 12/16/2014 1408   KETONESUR NEGATIVE 09/29/2015 1851   PROTEINUR NEGATIVE 09/29/2015 1851   UROBILINOGEN 1.0 12/23/2014 1303   UROBILINOGEN 0.2 12/16/2014 1408   NITRITE NEGATIVE 09/29/2015 1851   LEUKOCYTESUR NEGATIVE 09/29/2015 1851   LEUKOCYTESUR Negative 12/16/2014 1408   Sepsis Labs: Invalid input(s): PROCALCITONIN, LACTICIDVEN  No results found for this or any previous visit (from the past 240 hour(s)).    Radiology Studies: No results found.   Scheduled Meds: . benztropine mesylate  1 mg Intravenous BID  . busPIRone  10 mg Oral TID  . DULoxetine  60 mg Oral BID  . enoxaparin (LOVENOX) injection  0.5 mg/kg Subcutaneous Daily  . pantoprazole  40 mg Oral Daily  . potassium chloride SA  40 mEq Oral TID  . sertraline  25 mg Oral Daily  . sulfamethoxazole-trimethoprim  1 tablet Oral BID  . topiramate  50 mg Oral BID   Continuous Infusions: . 0.9 % NaCl with KCl 20 mEq / L 150 mL/hr at 09/30/15  9509    Marzetta Board, MD, PhD [facility] Pager [phone]  If 7PM-7AM, please contact night-coverage [email or web address] Password [facility] 09/30/2015, 10:37 AM

## 2015-09-30 NOTE — Clinical Social Work Note (Addendum)
Clinical Social Work Assessment  Patient Details  Name: Sheryl Mathis MRN: [id number] Date of Birth: 1970-07-06  Date of referral:  09/30/15               Reason for consult:  Emotional/Coping/Adjustment to Illness                Permission sought to share information with:  Psychiatrist, Family Supports Permission granted to share information::     Name::        Agency::     Relationship::   Father  Contact Information:   Yilin Weedon [phone]  Housing/Transportation Living arrangements for the past 2 months:  Single Family Home Source of Information:  Patient Patient Interpreter Needed:  None Criminal Activity/Legal Involvement Pertinent to Current Situation/Hospitalization:  No - Comment as needed Significant Relationships:  Other(Comment) (Mother Figure) Lives with:  Self Do you feel safe going back to the place where you live?  Yes Need for family participation in patient care:  Yes (Comment)  Care giving concerns:  Patient and friend (God Mother) reports patient has been depressed since January after she lost her job of twenty years due to [employer]. Patient reports having cancer and five surgeries that too have caused depression. Patient was admitted to behavioral health earlier this year. Patient has since followed up with [facility] and has been prescribed medication for her depression. The medication prescribed- Rexulti has caused patient to have severe side affects, stuttering and trembling. Patient reports, "I just want to get better." Patient denies any SI/HI ideation or intentions of harming self or others.  Social Worker assessment / plan:  Patient reports she will continue to follow up with [facility] for medication management. Psychiatrist MD. Adjusted patient medication and reports her symptoms should subside eventually once medication is out of body. LCSWA offered emotional support.  Employment status:  Unemployed Forensic scientist:  Medicaid In  [place] PT Recommendations:  Not assessed at this time Information / Referral to community resources:     Patient/Family's Response to care:  Patient/Family wants patient to continue her stay at hospital to monitor new medications and to follow up with her doctor [facility].   Patient/Family's Understanding of and Emotional Response to Diagnosis, Current Treatment, and Prognosis:  Patient is frustrated but understands she and the Dr. Did not have Had control of medications side affects. Patient reports she is hopeful to get better.  Emotional Assessment Appearance:  Appears stated age Attitude/Demeanor/Rapport:    Affect (typically observed):  Accepting, Depressed, Overwhelmed Orientation:  Oriented to Self, Oriented to Place, Oriented to  Time, Oriented to Situation Alcohol / Substance use:  Not Applicable Psych involvement (Current and /or in the community):  No (Comment)  Discharge Needs  Concerns to be addressed:  Care Coordination Readmission within the last 30 days:  Yes Current discharge risk:    Barriers to Discharge:  Continued Medical Work up   [employer], LCSW 09/30/2015, 4:13 PM
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
## 2015-09-30 NOTE — Progress Notes (Signed)
Rx brief note: Lovenox Rx adjusted Lovenox ot '60mg'$  daily (~0.5 mg/k) in pt with BmI=45  Thanks Dorrene German 09/30/2015 12:02 AM

## 2015-09-30 NOTE — Consult Note (Signed)
NEURO HOSPITALIST CONSULT NOTE   Requestig physician: Dr. Cruzita Lederer   Reason for Consult: Medication SE   History obtained from:  Patient   HPI:                                                                                                                                          Sheryl Mathis is an 45 y.o. female w hx depression, endometrial cancer who recently had medication changes (psych medications) and presenting with abnormal uncontrollable movements including tremors, stuttering speech and "twitching" of her arms and neck. She has long hx psych issues and didn't respond to Prozac according to the mother so about 4-5 weeks ago she was started on a new medication called Rexulti. She "did well" on it for about 3 weeks, but then last week started to have tremors of her arms and her neck.  This got worse and started to have difficulty speaking w stuttering and so on Sunday 4 days ago she called [facility] and they told her to stop the medication, Rexulti.  She didn't get better and so went in on Tuesday and was given cogentin and started on Zoloft.  Currently she is very anxiety ridden.   As noted in Up-to-date > 10% patient will acquire SE of Akathisia and 4-14% of these are dose related. 5-6% will acquire extrapyramidal reaction, <1% will acquire Dystonia including akathisia.  THIS drug is protien bound and has a half life of 91 hours (given it takes about 5 half lives to fully eliminate a drug-- it will take THIS EQUALS 18 days. Currently she is 6 days out of stopping the medication. Which is only 1.5 half lives.   Past Medical History:  Diagnosis Date  . Anemia   . Anxiety   . Cancer [facility])    ENDOMETRIAL CANCER (tx completed 05/2013)  . Complication of anesthesia    PT STATES SHE WOKE UP AFTER D&C [place].  STATES NO PROBLEMS WAKING UP AFTER HER HYSTERECTOMY  . Depression   . Headache(784.0)    MIGRAINES  . History of radiation therapy  04/23/13, 04/29/13, 05/07/13, 05/14/13, 05/21/13   30 Gy to proximal vagina  . Menorrhagia   . Migraines   . Neuropathy [facility])     Past Surgical History:  Procedure Laterality Date  . CESAREAN SECTION     X2  . CHOLECYSTECTOMY    . DILITATION & CURRETTAGE/HYSTROSCOPY WITH HYDROTHERMAL ABLATION N/A 01/01/2013   Procedure: DILATATION & CURETTAGE/HYSTEROSCOPY WITH ATTEMPTED HYDROTHERMAL ABLATION: Change over to [place] @ 2563;  Surgeon: Osborne Oman, MD;  Location: [place] [facility];  Service: Gynecology;  Laterality: N/A;  . LAPAROSCOPIC ASSISTED VAGINAL HYSTERECTOMY N/A 01/22/2013   Procedure: LAPAROSCOPIC ASSISTED VAGINAL HYSTERECTOMY with Bilateral Fallopian Tubes and Ovaries;  Surgeon: Osborne Oman, MD;  Location: [place] [facility];  Service: Gynecology;  Laterality: N/A;  . ROBOTIC ASSISTED TOTAL HYSTERECTOMY WITH BILATERAL SALPINGO OOPHERECTOMY Bilateral 03/31/2013   Procedure: ROBOTIC ASSISTED BILATERAL OOPHORECTOMY PELVIC AND POSSIBLE PARA-AORTIC LYMPH NODE DISSECTION;  Surgeon: Imagene Gurney A. Alycia Rossetti, MD;  Location: [facility];  Service: Gynecology;  Laterality: Bilateral;  . TUBAL LIGATION      Family History  Problem Relation Age of Onset  . Diabetes Mother   . Hypertension Mother   . Cancer Neg Hx      Social History:  reports that she quit smoking about 2 years ago. Her smoking use included Cigarettes. She has a 13.00 pack-year smoking history. She has never used smokeless tobacco. She reports that she does not drink alcohol or use drugs.  Allergies  Allergen Reactions  . Rexulti [Brexpiprazole] Other (See Comments)    Acute severe dystonic reaction (neck/ arm spasms, stuttering speech), Jul 2017  . Fluoxetine Other (See Comments)    Paranoia, altered mental status  . Hydrocodone-Acetaminophen Other (See Comments)    Too strong    MEDICATIONS:                                                                                                                     Scheduled: . benztropine  mesylate  1 mg Intravenous BID  . busPIRone  10 mg Oral TID  . DULoxetine  60 mg Oral BID  . enoxaparin (LOVENOX) injection  0.5 mg/kg Subcutaneous Daily  . pantoprazole  40 mg Oral Daily  . potassium chloride SA  40 mEq Oral TID  . sertraline  25 mg Oral Daily  . sulfamethoxazole-trimethoprim  1 tablet Oral BID  . topiramate  50 mg Oral BID   Continuous: . 0.9 % NaCl with KCl 20 mEq / L 150 mL/hr at 09/30/15 0728     ROS:                                                                                                                                       History obtained from the patient  General ROS: negative for - chills, fatigue, fever, night sweats, weight gain or weight loss Psychological ROS: negative for - behavioral disorder, hallucinations, memory difficulties, mood swings or suicidal ideation Ophthalmic ROS: negative for - blurry vision, double vision, eye pain or loss of vision ENT ROS: negative for - epistaxis, nasal discharge, oral  lesions, sore throat, tinnitus or vertigo Allergy and Immunology ROS: negative for - hives or itchy/watery eyes Hematological and Lymphatic ROS: negative for - bleeding problems, bruising or swollen lymph nodes Endocrine ROS: negative for - galactorrhea, hair pattern changes, polydipsia/polyuria or temperature intolerance Respiratory ROS: negative for - cough, hemoptysis, shortness of breath or wheezing Cardiovascular ROS: negative for - chest pain, dyspnea on exertion, edema or irregular heartbeat Gastrointestinal ROS: negative for - abdominal pain, diarrhea, hematemesis, nausea/vomiting or stool incontinence Genito-Urinary ROS: negative for - dysuria, hematuria, incontinence or urinary frequency/urgency Musculoskeletal ROS: negative for - joint swelling or muscular weakness Neurological ROS: as noted in HPI Dermatological ROS: negative for rash and skin lesion changes   Blood pressure 116/74, pulse 80, temperature 97.8 F (36.6 C),  temperature source Oral, resp. rate 20, height '5\' 7"'$  (1.702 m), weight 130.7 kg (288 lb 2.3 oz), last menstrual period 11/03/2012, SpO2 96 %.   Neurologic Examination:                                                                                                      HEENT-  Normocephalic, no lesions, without obvious abnormality.  Normal external eye and conjunctiva.  Normal TM's bilaterally.  Normal auditory canals and external ears. Normal external nose, mucus membranes and septum.  Normal pharynx. Cardiovascular- S1, S2 normal, pulses palpable throughout   Lungs- chest clear, no wheezing, rales, normal symmetric air entry Abdomen- soft, non-tender; bowel sounds normal; no masses,  no organomegaly Extremities- no edema Lymph-no adenopathy palpable Musculoskeletal-no joint tenderness, deformity or swelling Skin-warm and dry, no hyperpigmentation, vitiligo, or suspicious lesions  Neurological Examination Mental Status: Alert, oriented, thought content appropriate.  Speech with stuttering but if calmed down she is able to talk without stuttering.  without evidence of aphasia.  Able to follow 3 step commands without difficulty. Cranial Nerves: II: Discs flat bilaterally; Visual fields grossly normal, pupils equal, round, reactive to light and accommodation III,IV, VI: ptosis not present, extra-ocular motions intact bilaterally V,VII: smile symmetric, facial light touch sensation normal bilaterally VIII: hearing normal bilaterally IX,X: uvula rises symmetrically XI: bilateral shoulder shrug XII: midline tongue extension Motor: Right : Upper extremity   5/5    Left:     Upper extremity   5/5  Lower extremity   5/5     Lower extremity   5/5 Tone and bulk:normal tone throughout; no atrophy noted Sensory: Pinprick and light touch intact throughout, bilaterally Deep Tendon Reflexes: 1+ and symmetric throughout Plantars: Right: downgoing   Left: downgoing Cerebellar: normal finger-to-nose  and normal heel-to-shin test Gait: not tested      Lab Results: Basic Metabolic Panel:  Recent Labs Lab 09/29/15 1900 09/30/15 0543  NA 130* 136  K 2.4* 2.9*  CL 92* 101  CO2 26 27  GLUCOSE 138* 148*  BUN 15 12  CREATININE 0.98 0.85  CALCIUM 9.2 8.7*  MG 2.2  --     Liver Function Tests:  Recent Labs Lab 09/29/15 1900  AST 75*  ALT 95*  ALKPHOS 99  BILITOT 0.5  PROT 8.4*  ALBUMIN 4.2   No results for input(s): LIPASE, AMYLASE in the last 168 hours. No results for input(s): AMMONIA in the last 168 hours.  CBC:  Recent Labs Lab 09/29/15 1900  WBC 9.4  NEUTROABS 4.7  HGB 13.9  HCT 39.8  MCV 83.6  PLT 398    Cardiac Enzymes:  Recent Labs Lab 09/29/15 1900  CKTOTAL 107    Lipid Panel: No results for input(s): CHOL, TRIG, HDL, CHOLHDL, VLDL, LDLCALC in the last 168 hours.  CBG: No results for input(s): GLUCAP in the last 168 hours.  Microbiology: Results for orders placed or performed in visit on 09/30/14  Urine culture     Status: None   Collection Time: 09/30/14 12:38 PM  Result Value Ref Range Status   Urine Culture, Routine Culture, Urine  Final    Comment: Final - ===== COLONY COUNT: ===== 80,000 COLONIES/ML DIPTHEROIDS (CORYNEBACTERIUM SPECIES)  ------------------------------------------------------------------------ Standardized susceptibility testing for this organism is not available.     Coagulation Studies: No results for input(s): LABPROT, INR in the last 72 hours.  Imaging: No results found.     Assessment and plan per attending neurologist  Etta Quill PA-C [facility] [phone]  09/30/2015, 8:55 AM   Assessment/Plan: 45 YO female with acquired extrapyramidal symptoms secondary to [place].  As noted in Up-to-date > 10% patient will acquire SE of Akathisia and 4-14% of these are dose related. 5-6% will acquire extrapyramidal reaction, <1% will acquire Dystonia including akathisia.  THIS drug is  protien bound and has a half life of 91 hours (given it takes about 5 half lives to fully eliminate a drug-- it will take THIS EQUALS 18 days. Currently she is 6 days out of stopping the medication. Which is only 1.5 half lives. Beta blocker (Propranalol), 1-2 mg daily of  Benztropine 2-3 times a day (increaseing 0.5 mg at 5-6 day intervals) or 50 mg benadryl and/or benzodiazepines have been noted to help with symptoms of akithisia.   She was already started on a low dose Benztropine at 0.5 mg BID which has been restarted today but not given.    Neurology attending:  Antony Mathis presents with subtle movement abnormalities possibly related to the administration of the [place]. She is already on Cogentin and Benadryl. She is also having some stuttering. It is unclear if that is related to a medication side effect or rather to anxiety.  Elson Clan M.D. Neuro hospitalist [phone]

## 2015-09-30 NOTE — Care Management Note (Signed)
Case Management Note  Patient Details  Name: LADAJA YUSUPOV MRN: [id number] Date of Birth: [birth year]  Subjective/Objective:  45 y/o f admitted w/acute  Neuroleptic induced dystonia. From home. Has pcp, pharmacy, also goes to [facility].No CM needs.                  Action/Plan:d/c plan home.   Expected Discharge Date:                  Expected Discharge Plan:  Home/Self Care  In-House Referral:     Discharge planning Services  CM Consult  Post Acute Care Choice:    Choice offered to:     DME Arranged:    DME Agency:     HH Arranged:    HH Agency:     Status of Service:  In process, will continue to follow  If discussed at Long Length of Stay Meetings, dates discussed:    Additional Comments:  Dessa Phi, RN 09/30/2015, 4:11 PM

## 2015-10-01 LAB — BASIC METABOLIC PANEL
Anion gap: 6 (ref 5–15)
BUN: 11 mg/dL (ref 6–20)
CHLORIDE: 107 mmol/L (ref 101–111)
CO2: 23 mmol/L (ref 22–32)
CREATININE: 0.79 mg/dL (ref 0.44–1.00)
Calcium: 8.7 mg/dL — ABNORMAL LOW (ref 8.9–10.3)
GFR calc Af Amer: >60 mL/min (ref >60)
GLUCOSE: 122 mg/dL — AB (ref 65–99)
POTASSIUM: 4.1 mmol/L (ref 3.5–5.1)
SODIUM: 136 mmol/L (ref 135–145)

## 2015-10-01 MED ORDER — VITAMINS A & D EX OINT
TOPICAL_OINTMENT | CUTANEOUS | Status: AC
  Filled 2015-10-01: qty 5

## 2015-10-01 MED ORDER — BENZTROPINE MESYLATE 0.5 MG PO TABS
1.0000 mg | ORAL_TABLET | Freq: Two times a day (BID) | ORAL | Status: DC
  Administered 2015-10-01 – 2015-10-02 (×3): 1 mg via ORAL
  Filled 2015-10-01 (×3): qty 2

## 2015-10-01 MED ORDER — MENTHOL 3 MG MT LOZG
1.0000 | LOZENGE | OROMUCOSAL | Status: DC | PRN
  Administered 2015-10-01: 3 mg via ORAL
  Filled 2015-10-01: qty 9

## 2015-10-01 NOTE — Progress Notes (Signed)
PROGRESS NOTE  Sheryl Mathis NIO:[id number] DOB: Mar 26, 1970 DOA: 09/29/2015 PCP: Pcp Not In System   LOS: 1 day   Brief Narrative: 45 y.o. female w hx depression, endometrial cancer who recently had medication changes (psych medications) and presenting with abnormal uncontrollable movements including tremors, stuttering speech and "twitching" of her arms and neck. 4-5 weeks ago she was started on a new medication called Rexulti, and 3-4 weeks afterwards started having tremors of her arms and back, cold [facility] and this medication was discontinued 4 days prior to admission. She was also placed on Cogentin.  Assessment & Plan: Principal Problem:   Acute neuroleptic-induced dystonia Active Problems:   History of uterine cancer   MDD (major depressive disorder), recurrent severe, without psychosis ([place])   Medication reaction   Acute dystonia due to drugs   Acute dystonic reaction  - to Rexulti, a new atypical antipsychotic agents, stopped 4-5 days prior to admission  - It has long half-life 4 days.  - Patient received IV Cogentin 2 mg now, placed on 1 mg twice daily IV, transition to 1 mg po BID - Added low-dose Benadryl - Neurology as well as psychiatry consulted, evaluated patient yesterday   Chronic severe depression  - psychiatry consulted  Hx uterine cancer  - rx w surgery/ brachytherapy   Hypokalemia - resolved  DVT prophylaxis: Lovenox Code Status: Full Family Communication: no family bedside Disposition Plan: home 1 day   Consultants:   Neurology  Psychiatry  Procedures:   None  Antimicrobials:  None   Subjective: - appreciates improvement  Objective: Vitals:   09/30/15 0500 09/30/15 1251 09/30/15 2106 10/01/15 0632  BP: 116/74 118/82 129/82 127/83  Pulse: 80 72 70 63  Resp: '20 19 20 20  '$ Temp: 97.8 F (36.6 C) 98.4 F (36.9 C) 98.3 F (36.8 C) 97.9 F (36.6 C)  TempSrc: Oral Oral Oral Oral  SpO2: 96% 97% 97% 99%  Weight:      Height:         Intake/Output Summary (Last 24 hours) at 10/01/15 0811 Last data filed at 10/01/15 0657  Gross per 24 hour  Intake           4117.5 ml  Output             4220 ml  Net           -102.5 ml   Filed Weights   09/29/15 2345  Weight: 130.7 kg (288 lb 2.3 oz)    Examination: Constitutional: NAD Vitals:   09/30/15 0500 09/30/15 1251 09/30/15 2106 10/01/15 0632  BP: 116/74 118/82 129/82 127/83  Pulse: 80 72 70 63  Resp: '20 19 20 20  '$ Temp: 97.8 F (36.6 C) 98.4 F (36.9 C) 98.3 F (36.8 C) 97.9 F (36.6 C)  TempSrc: Oral Oral Oral Oral  SpO2: 96% 97% 97% 99%  Weight:      Height:       Respiratory: clear to auscultation bilaterally, no wheezing, no crackles. Cardiovascular: Regular rate and rhythm, no murmurs / rubs / gallops.  Abdomen: no tenderness. Bowel sounds positive.  Neurologic: non focal, stuttering speech, arm tremors.    Data Reviewed: I have personally reviewed following labs and imaging studies  CBC:  Recent Labs Lab 09/29/15 1900  WBC 9.4  NEUTROABS 4.7  HGB 13.9  HCT 39.8  MCV 83.6  PLT 818   Basic Metabolic Panel:  Recent Labs Lab 09/29/15 1900 09/30/15 0543 10/01/15 0544  NA 130* 136 136  K  2.4* 2.9* 4.1  CL 92* 101 107  CO2 '26 27 23  '$ GLUCOSE 138* 148* 122*  BUN '15 12 11  '$ CREATININE 0.98 0.85 0.79  CALCIUM 9.2 8.7* 8.7*  MG 2.2  --   --    GFR: Estimated Creatinine Clearance: 126.4 mL/min (by C-G formula based on SCr of 0.8 mg/dL). Liver Function Tests:  Recent Labs Lab 09/29/15 1900  AST 75*  ALT 95*  ALKPHOS 99  BILITOT 0.5  PROT 8.4*  ALBUMIN 4.2   No results for input(s): LIPASE, AMYLASE in the last 168 hours. No results for input(s): AMMONIA in the last 168 hours. Coagulation Profile: No results for input(s): INR, PROTIME in the last 168 hours. Cardiac Enzymes:  Recent Labs Lab 09/29/15 1900  CKTOTAL 107   BNP (last 3 results) No results for input(s): PROBNP in the last 8760 hours. HbA1C: No results for  input(s): HGBA1C in the last 72 hours. CBG: No results for input(s): GLUCAP in the last 168 hours. Lipid Profile: No results for input(s): CHOL, HDL, LDLCALC, TRIG, CHOLHDL, LDLDIRECT in the last 72 hours. Thyroid Function Tests: No results for input(s): TSH, T4TOTAL, FREET4, T3FREE, THYROIDAB in the last 72 hours. Anemia Panel: No results for input(s): VITAMINB12, FOLATE, FERRITIN, TIBC, IRON, RETICCTPCT in the last 72 hours. Urine analysis:    Component Value Date/Time   COLORURINE YELLOW 09/29/2015 [place] 09/29/2015 1851   LABSPEC 1.016 09/29/2015 1851   LABSPEC 1.010 12/16/2014 1408   PHURINE 5.5 09/29/2015 1851   GLUCOSEU NEGATIVE 09/29/2015 1851   GLUCOSEU Negative 12/16/2014 1408   HGBUR NEGATIVE 09/29/2015 1851   BILIRUBINUR NEGATIVE 09/29/2015 1851   BILIRUBINUR Negative 12/16/2014 1408   KETONESUR NEGATIVE 09/29/2015 1851   PROTEINUR NEGATIVE 09/29/2015 1851   UROBILINOGEN 1.0 12/23/2014 1303   UROBILINOGEN 0.2 12/16/2014 1408   NITRITE NEGATIVE 09/29/2015 1851   LEUKOCYTESUR NEGATIVE 09/29/2015 1851   LEUKOCYTESUR Negative 12/16/2014 1408   Sepsis Labs: Invalid input(s): PROCALCITONIN, LACTICIDVEN  No results found for this or any previous visit (from the past 240 hour(s)).    Radiology Studies: No results found.   Scheduled Meds: . vitamin A & D      . benztropine  1 mg Oral BID  . busPIRone  10 mg Oral TID  . clonazePAM  0.5 mg Oral TID AC  . DULoxetine  60 mg Oral BID  . enoxaparin (LOVENOX) injection  0.5 mg/kg Subcutaneous Daily  . gabapentin  400 mg Oral TID  . pantoprazole  40 mg Oral Daily  . potassium chloride SA  40 mEq Oral TID  . sulfamethoxazole-trimethoprim  1 tablet Oral BID  . topiramate  50 mg Oral BID   Continuous Infusions:    Marzetta Board, MD, PhD [facility] Pager [phone]  If 7PM-7AM, please contact night-coverage [email or web address] Password [email or web address] 10/01/2015, 8:11 AM

## 2015-10-01 NOTE — Progress Notes (Signed)
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 She appears to be doing much better at this point. She does not appear to have any significant evidence of tardive dyskinesia at this time. Her speech is stuttering in nature but it is unclear if that is related to extrapyramidal dysfunction. The family has noted that the stuttering is quite intermittent and at times is not actually present.  Exam: Vitals:   09/30/15 2106 10/01/15 0632  BP: 129/82 127/83  Pulse: 70 63  Resp: 20 20  Temp: 98.3 F (36.8 C) 97.9 F (36.6 C)    HEENT-  Normocephalic, no lesions, without obvious abnormality.  Normal external eye and conjunctiva.  Normal TM's bilaterally.  Normal auditory canals and external ears. Normal external nose, mucus membranes and septum.  Normal pharynx. Cardiovascular- regular rate and rhythm, S1, S2 normal, no murmur, click, rub or gallop, pulses palpable throughout   Lungs- chest clear, no wheezing, rales, normal symmetric air entry, Heart exam - S1, S2 normal, no murmur, no gallop, rate regular Abdomen- soft, non-tender; bowel sounds normal; no masses,  no organomegaly Extremities- less then 2 second capillary refill Lymph-no adenopathy palpable Musculoskeletal-no joint tenderness, deformity or swelling Skin-warm and dry, no hyperpigmentation, vitiligo, or suspicious lesions    Gen: In bed, NAD MS: Alert and oriented 3 CN: Nerves II through XII are intact Motor: Motor exam is 5 out of 5 bilaterally Sensory: Sensation is intact DTR: Refluxes are 1-2+ bilaterally  Pertinent Labs/Diagnostics: Reviewed    Impression:   The dyskinesias appear to have resolved at this point. It is unclear of the stuttering is a related finding it or is more of a stress related event.   Recommendations:  1. Agree with present care.  2. Neurology will sign off at this time. Please reconsult with any new neurological  issues.   Pearse Shiffler A. Tasia Catchings, M.D. Neurohospitalist Phone: [phone]   10/01/2015, 7:00 PM

## 2015-10-01 NOTE — Progress Notes (Signed)
[facility] MD Progress Note  10/01/2015 11:49 AM Sheryl Mathis  MRN:  [id number] Subjective:  EPS.  Reports her neck is no longer painful but continues to have stuttering and teeth clenching.  Continue current Gabapentin and Klonopin doses.  Dr. Dwyane Dee reviewed this patient and concurs with the plan. Principal Problem: MDD (major depressive disorder), recurrent severe, without psychosis ([place]) Diagnosis:   Patient Active Problem List   Diagnosis Date Noted  . MDD (major depressive disorder), recurrent severe, without psychosis ([address]) [F33.2] 04/24/2014    Priority: High  . Acute neuroleptic-induced dystonia [G24.02] 09/29/2015  . Medication reaction [T88.7XXA] 09/29/2015  . Acute dystonia due to drugs [G24.02] 09/29/2015  . Severe episode of recurrent major depressive disorder, without psychotic features ([place]) [F33.2]   . MDD (major depressive disorder), recurrent episode, severe ([place]) [F33.2] 03/01/2015  . Vasomotor flushing [R23.2] 09/02/2014  . Vaginal candidiasis [B37.3] 09/02/2014  . Panic disorder [F41.0] 04/25/2014  . Obesity (BMI 30-39.9) [E66.9] 02/01/2014  . Uterine cancer ([place]) [C55] 03/31/2013  . History of uterine cancer [Z85.42] 02/02/2013  . S/P laparoscopic assisted vaginal hysterectomy (LAVH) and bilateral salpingectomy (BS) on 01/22/13 [Z90.710] 01/22/2013   Total Time spent with patient: 30 minutes  Past Psychiatric History: depression  Past Medical History:  Past Medical History:  Diagnosis Date  . Anemia   . Anxiety   . Cancer [facility])    ENDOMETRIAL CANCER (tx completed 05/2013)  . Complication of anesthesia    PT STATES SHE WOKE UP AFTER D&C [place].  STATES NO PROBLEMS WAKING UP AFTER HER HYSTERECTOMY  . Depression   . Headache(784.0)    MIGRAINES  . History of radiation therapy 04/23/13, 04/29/13, 05/07/13, 05/14/13, 05/21/13   30 Gy to proximal vagina  . Menorrhagia   . Migraines   . Neuropathy [facility])     Past Surgical History:  Procedure  Laterality Date  . CESAREAN SECTION     X2  . CHOLECYSTECTOMY    . DILITATION & CURRETTAGE/HYSTROSCOPY WITH HYDROTHERMAL ABLATION N/A 01/01/2013   Procedure: DILATATION & CURETTAGE/HYSTEROSCOPY WITH ATTEMPTED HYDROTHERMAL ABLATION: Change over to [place] @ 3500;  Surgeon: Osborne Oman, MD;  Location: [place] [facility];  Service: Gynecology;  Laterality: N/A;  . LAPAROSCOPIC ASSISTED VAGINAL HYSTERECTOMY N/A 01/22/2013   Procedure: LAPAROSCOPIC ASSISTED VAGINAL HYSTERECTOMY with Bilateral Fallopian Tubes and Ovaries;  Surgeon: Osborne Oman, MD;  Location: [place] [facility];  Service: Gynecology;  Laterality: N/A;  . ROBOTIC ASSISTED TOTAL HYSTERECTOMY WITH BILATERAL SALPINGO OOPHERECTOMY Bilateral 03/31/2013   Procedure: ROBOTIC ASSISTED BILATERAL OOPHORECTOMY PELVIC AND POSSIBLE PARA-AORTIC LYMPH NODE DISSECTION;  Surgeon: Imagene Gurney A. Alycia Rossetti, MD;  Location: [facility];  Service: Gynecology;  Laterality: Bilateral;  . TUBAL LIGATION     Family History:  Family History  Problem Relation Age of Onset  . Diabetes Mother   . Hypertension Mother   . Cancer Neg Hx    Family Psychiatric  History: none Social History:  History  Alcohol Use No     History  Drug Use No    Social History   Social History  . Marital status: Divorced    Spouse name: N/A  . Number of children: N/A  . Years of education: N/A   Social History Main Topics  . Smoking status: Former Smoker    Packs/day: 0.50    Years: 26.00    Types: Cigarettes    Quit date: 08/03/2013  . Smokeless tobacco: Never Used  . Alcohol use No  . Drug  use: No  . Sexual activity: Not Currently    Birth control/ protection: Surgical   Other Topics Concern  . None   Social History Narrative  . None   Additional Social History:                         Sleep: Fair  Appetite:  Good  Current Medications: Current Facility-Administered Medications  Medication Dose Route Frequency Provider Last Rate Last Dose  . vitamin A &  D ointment           . benztropine (COGENTIN) tablet 1 mg  1 mg Oral BID Caren Griffins, MD   1 mg at 10/01/15 0923  . busPIRone (BUSPAR) tablet 10 mg  10 mg Oral TID Roney Jaffe, MD   10 mg at 10/01/15 0921  . clonazePAM (KLONOPIN) tablet 0.5 mg  0.5 mg Oral TID AC Ambrose Finland, MD   0.5 mg at 10/01/15 0924  . diphenhydrAMINE (BENADRYL) injection 12.5-25 mg  12.5-25 mg Intravenous Q6H PRN Caren Griffins, MD   25 mg at 09/30/15 1700  . DULoxetine (CYMBALTA) DR capsule 60 mg  60 mg Oral BID Roney Jaffe, MD   60 mg at 10/01/15 0924  . enoxaparin (LOVENOX) injection 65 mg  0.5 mg/kg Subcutaneous Daily Luiz Ochoa, RPH   65 mg at 10/01/15 3295  . gabapentin (NEURONTIN) capsule 400 mg  400 mg Oral TID Ambrose Finland, MD   400 mg at 10/01/15 1884  . HYDROmorphone (DILAUDID) injection 0.5-1 mg  0.5-1 mg Intravenous Q6H PRN Roney Jaffe, MD   0.5 mg at 09/30/15 0049  . ibuprofen (ADVIL,MOTRIN) tablet 200-400 mg  200-400 mg Oral Q6H PRN Caren Griffins, MD   400 mg at 10/01/15 1660  . ondansetron (ZOFRAN) tablet 4 mg  4 mg Oral Q6H PRN Roney Jaffe, MD       Or  . ondansetron [facility]) injection 4 mg  4 mg Intravenous Q6H PRN Roney Jaffe, MD      . pantoprazole (PROTONIX) EC tablet 40 mg  40 mg Oral Daily Roney Jaffe, MD   40 mg at 10/01/15 0923  . phenol-menthol (CEPASTAT) lozenge 1 lozenge  1 lozenge Buccal PRN Caren Griffins, MD   1 lozenge at 09/30/15 1346  . potassium chloride SA (K-DUR,KLOR-CON) CR tablet 40 mEq  40 mEq Oral TID Roney Jaffe, MD   40 mEq at 10/01/15 0924  . sulfamethoxazole-trimethoprim (BACTRIM DS,SEPTRA DS) 800-160 MG per tablet 1 tablet  1 tablet Oral BID Roney Jaffe, MD   1 tablet at 10/01/15 0924  . topiramate (TOPAMAX) tablet 50 mg  50 mg Oral BID Roney Jaffe, MD   50 mg at 10/01/15 6301    Lab Results:  Results for orders placed or performed during the hospital encounter of 09/29/15 (from the past 48 hour(s))  Pregnancy,  urine     Status: None   Collection Time: 09/29/15  6:51 PM  Result Value Ref Range   Preg Test, Ur NEGATIVE NEGATIVE    Comment:        THE SENSITIVITY OF THIS METHODOLOGY IS >20 mIU/mL.   Urinalysis, Routine w reflex microscopic     Status: None   Collection Time: 09/29/15  6:51 PM  Result Value Ref Range   Color, Urine YELLOW YELLOW   APPearance CLEAR CLEAR   Specific Gravity, Urine 1.016 1.005 - 1.030   pH 5.5 5.0 - 8.0   Glucose, UA NEGATIVE NEGATIVE  mg/dL   Hgb urine dipstick NEGATIVE NEGATIVE   Bilirubin Urine NEGATIVE NEGATIVE   Ketones, ur NEGATIVE NEGATIVE mg/dL   Protein, ur NEGATIVE NEGATIVE mg/dL   Nitrite NEGATIVE NEGATIVE   Leukocytes, UA NEGATIVE NEGATIVE    Comment: MICROSCOPIC NOT DONE ON URINES WITH NEGATIVE PROTEIN, BLOOD, LEUKOCYTES, NITRITE, OR GLUCOSE <1000 mg/dL.  Urine rapid drug screen (hosp performed)     Status: None   Collection Time: 09/29/15  6:51 PM  Result Value Ref Range   Opiates NONE DETECTED NONE DETECTED   Cocaine NONE DETECTED NONE DETECTED   Benzodiazepines NONE DETECTED NONE DETECTED   Amphetamines NONE DETECTED NONE DETECTED   Tetrahydrocannabinol NONE DETECTED NONE DETECTED   Barbiturates NONE DETECTED NONE DETECTED    Comment:        DRUG SCREEN FOR MEDICAL PURPOSES ONLY.  IF CONFIRMATION IS NEEDED FOR ANY PURPOSE, NOTIFY LAB WITHIN 5 DAYS.        LOWEST DETECTABLE LIMITS FOR URINE DRUG SCREEN Drug Class       Cutoff (ng/mL) Amphetamine      1000 Barbiturate      200 Benzodiazepine   121 Tricyclics       975 Opiates          300 Cocaine          300 THC              50   Comprehensive metabolic panel     Status: Abnormal   Collection Time: 09/29/15  7:00 PM  Result Value Ref Range   Sodium 130 (L) 135 - 145 mmol/L   Potassium 2.4 (LL) 3.5 - 5.1 mmol/L    Comment: CRITICAL RESULT CALLED TO, READ BACK BY AND VERIFIED WITH: L.ADKINS RN AT 2010 ON 09/29/15 BY S.VANHOORNE    Chloride 92 (L) 101 - 111 mmol/L   CO2 26 22 -  32 mmol/L   Glucose, Bld 138 (H) 65 - 99 mg/dL   BUN 15 6 - 20 mg/dL   Creatinine, Ser 0.98 0.44 - 1.00 mg/dL   Calcium 9.2 8.9 - 10.3 mg/dL   Total Protein 8.4 (H) 6.5 - 8.1 g/dL   Albumin 4.2 3.5 - 5.0 g/dL   AST 75 (H) 15 - 41 U/L   ALT 95 (H) 14 - 54 U/L   Alkaline Phosphatase 99 38 - 126 U/L   Total Bilirubin 0.5 0.3 - 1.2 mg/dL   GFR calc non Af Amer >60 >60 mL/min   GFR calc Af Amer >60 >60 mL/min    Comment: (NOTE) The eGFR has been calculated using the CKD EPI equation. This calculation has not been validated in all clinical situations. eGFR's persistently <60 mL/min signify possible Chronic Kidney Disease.    Anion gap 12 5 - 15  Acetaminophen level     Status: Abnormal   Collection Time: 09/29/15  7:00 PM  Result Value Ref Range   Acetaminophen (Tylenol), Serum <10 (L) 10 - 30 ug/mL    Comment:        THERAPEUTIC CONCENTRATIONS VARY SIGNIFICANTLY. A RANGE OF 10-30 ug/mL MAY BE AN EFFECTIVE CONCENTRATION FOR MANY PATIENTS. HOWEVER, SOME ARE BEST TREATED AT CONCENTRATIONS OUTSIDE THIS RANGE. ACETAMINOPHEN CONCENTRATIONS >150 ug/mL AT 4 HOURS AFTER INGESTION AND >50 ug/mL AT 12 HOURS AFTER INGESTION ARE OFTEN ASSOCIATED WITH TOXIC REACTIONS.   Salicylate level     Status: None   Collection Time: 09/29/15  7:00 PM  Result Value Ref Range   Salicylate Lvl <8.8 2.8 -  30.0 mg/dL  Ethanol     Status: None   Collection Time: 09/29/15  7:00 PM  Result Value Ref Range   Alcohol, Ethyl (B) <5 <5 mg/dL    Comment:        LOWEST DETECTABLE LIMIT FOR SERUM ALCOHOL IS 5 mg/dL FOR MEDICAL PURPOSES ONLY   CBC with Differential     Status: None   Collection Time: 09/29/15  7:00 PM  Result Value Ref Range   WBC 9.4 4.0 - 10.5 K/uL   RBC 4.76 3.87 - 5.11 MIL/uL   Hemoglobin 13.9 12.0 - 15.0 g/dL   HCT 39.8 36.0 - 46.0 %   MCV 83.6 78.0 - 100.0 fL   MCH 29.2 26.0 - 34.0 pg   MCHC 34.9 30.0 - 36.0 g/dL   RDW 13.1 11.5 - 15.5 %   Platelets 398 150 - 400 K/uL    Neutrophils Relative % 50 %   Neutro Abs 4.7 1.7 - 7.7 K/uL   Lymphocytes Relative 40 %   Lymphs Abs 3.7 0.7 - 4.0 K/uL   Monocytes Relative 7 %   Monocytes Absolute 0.7 0.1 - 1.0 K/uL   Eosinophils Relative 2 %   Eosinophils Absolute 0.2 0.0 - 0.7 K/uL   Basophils Relative 1 %   Basophils Absolute 0.1 0.0 - 0.1 K/uL  CK     Status: None   Collection Time: 09/29/15  7:00 PM  Result Value Ref Range   Total CK 107 38 - 234 U/L  Magnesium     Status: None   Collection Time: 09/29/15  7:00 PM  Result Value Ref Range   Magnesium 2.2 1.7 - 2.4 mg/dL  Basic metabolic panel     Status: Abnormal   Collection Time: 09/30/15  5:43 AM  Result Value Ref Range   Sodium 136 135 - 145 mmol/L   Potassium 2.9 (L) 3.5 - 5.1 mmol/L    Comment: DELTA CHECK NOTED REPEATED TO VERIFY NO VISIBLE HEMOLYSIS    Chloride 101 101 - 111 mmol/L   CO2 27 22 - 32 mmol/L   Glucose, Bld 148 (H) 65 - 99 mg/dL   BUN 12 6 - 20 mg/dL   Creatinine, Ser 0.85 0.44 - 1.00 mg/dL   Calcium 8.7 (L) 8.9 - 10.3 mg/dL   GFR calc non Af Amer >60 >60 mL/min   GFR calc Af Amer >60 >60 mL/min    Comment: (NOTE) The eGFR has been calculated using the CKD EPI equation. This calculation has not been validated in all clinical situations. eGFR's persistently <60 mL/min signify possible Chronic Kidney Disease.    Anion gap 8 5 - 15  Basic metabolic panel     Status: Abnormal   Collection Time: 10/01/15  5:44 AM  Result Value Ref Range   Sodium 136 135 - 145 mmol/L   Potassium 4.1 3.5 - 5.1 mmol/L    Comment: RESULT REPEATED AND VERIFIED DELTA CHECK NOTED    Chloride 107 101 - 111 mmol/L   CO2 23 22 - 32 mmol/L   Glucose, Bld 122 (H) 65 - 99 mg/dL   BUN 11 6 - 20 mg/dL   Creatinine, Ser 0.79 0.44 - 1.00 mg/dL   Calcium 8.7 (L) 8.9 - 10.3 mg/dL   GFR calc non Af Amer >60 >60 mL/min   GFR calc Af Amer >60 >60 mL/min    Comment: (NOTE) The eGFR has been calculated using the CKD EPI equation. This calculation has not  been validated in  all clinical situations. eGFR's persistently <60 mL/min signify possible Chronic Kidney Disease.    Anion gap 6 5 - 15    Blood Alcohol level:  Lab Results  Component Value Date   ETH <5 09/29/2015   ETH <5 90/30/0923    Metabolic Disorder Labs: Lab Results  Component Value Date   HGBA1C 6.4 (H) 03/03/2015   MPG 137 03/03/2015   MPG 128 (H) 12/04/2013   No results found for: PROLACTIN Lab Results  Component Value Date   CHOL 265 (H) 12/04/2013   TRIG 154 (H) 12/04/2013   HDL 49 12/04/2013   CHOLHDL 5.4 12/04/2013   VLDL 31 12/04/2013   LDLCALC 185 (H) 12/04/2013    Physical Findings: AIMS:  , ,  ,  ,    CIWA:    COWS:     Musculoskeletal: Strength & Muscle Tone: within normal limits Gait & Station: normal Patient leans: N/A  Psychiatric Specialty Exam: Physical Exam  Constitutional: She is oriented to person, place, and time. She appears well-developed and well-nourished.  HENT:  Head: Normocephalic.  Neck: Normal range of motion.  Respiratory: Effort normal.  Musculoskeletal: Normal range of motion.  Neurological: She is alert and oriented to person, place, and time.  Skin: Skin is warm and dry.  Psychiatric: Her speech is normal and behavior is normal. Judgment and thought content normal. Her mood appears anxious. Cognition and memory are normal. She exhibits a depressed mood.    Review of Systems  Constitutional: Negative.   HENT: Negative.   Eyes: Negative.   Respiratory: Negative.   Cardiovascular: Negative.   Gastrointestinal: Negative.   Genitourinary: Negative.   Musculoskeletal: Positive for neck pain.  Skin: Negative.   Neurological: Negative.   Endo/Heme/Allergies: Negative.   Psychiatric/Behavioral: Positive for depression.    Blood pressure 127/83, pulse 63, temperature 97.9 F (36.6 C), temperature source Oral, resp. rate 20, height '5\' 7"'  (1.702 m), weight 130.7 kg (288 lb 2.3 oz), last menstrual period 11/03/2012,  SpO2 99 %.Body mass index is 45.13 kg/m.  General Appearance: Casual  Eye Contact:  Good  Speech:  Normal Rate  Volume:  Normal  Mood:  Depressed  Affect:  Congruent  Thought Process:  Coherent and Descriptions of Associations: Intact  Orientation:  Full (Time, Place, and Person)  Thought Content:  WDL  Suicidal Thoughts:  No  Homicidal Thoughts:  No  Memory:  Immediate;   Fair Recent;   Fair Remote;   Fair  Judgement:  Fair  Insight:  Fair  Psychomotor Activity:  Decreased  Concentration:  Concentration: Fair and Attention Span: Fair  Recall:  [employer] of Knowledge:  Fair  Language:  Good  Akathisia:  Yes  Handed:  Right  AIMS (if indicated):     Assets:  Housing Leisure Time  ADL's:  Intact  Cognition:  WNL  Sleep:        Treatment Plan Summary: Daily contact with patient to assess and evaluate symptoms and progress in treatment, Medication management and Plan dystonia:  Continue current regiment of gabapentin 400 mg TID for EPS and Klonopin 0.5 mg TID for tremors.  Waylan Boga, NP 10/01/2015, 11:49 AM

## 2015-10-02 LAB — BASIC METABOLIC PANEL
ANION GAP: 6 (ref 5–15)
BUN: 12 mg/dL (ref 6–20)
CO2: 23 mmol/L (ref 22–32)
CREATININE: 0.69 mg/dL (ref 0.44–1.00)
Calcium: 8.9 mg/dL (ref 8.9–10.3)
Chloride: 106 mmol/L (ref 101–111)
GFR calc non Af Amer: >60 mL/min (ref >60)
GLUCOSE: 130 mg/dL — AB (ref 65–99)
Potassium: 3.8 mmol/L (ref 3.5–5.1)
Sodium: 135 mmol/L (ref 135–145)

## 2015-10-02 MED ORDER — BENZTROPINE MESYLATE 1 MG PO TABS: 1.0000 mg | ORAL_TABLET | Freq: Two times a day (BID) | ORAL | 0 refills | Status: AC

## 2015-10-02 MED ORDER — CLONAZEPAM 0.5 MG PO TABS: 0.5000 mg | ORAL_TABLET | Freq: Three times a day (TID) | ORAL | 0 refills | Status: AC | PRN

## 2015-10-02 NOTE — Discharge Instructions (Signed)
Follow with [facility] in 1-2 weeks  Please get a complete blood count and chemistry panel checked by your Primary MD at your next visit, and again as instructed by your Primary MD. Please get your medications reviewed and adjusted by your Primary MD.  Please request your Primary MD to go over all Hospital Tests and Procedure/Radiological results at the follow up, please get all Hospital records sent to your Prim MD by signing hospital release before you go home.  If you had Pneumonia of Lung problems at the Hospital: Please get a 2 view Chest X ray done in 6-8 weeks after hospital discharge or sooner if instructed by your Primary MD.  If you have Congestive Heart Failure: Please call your Cardiologist or Primary MD anytime you have any of the following symptoms:  1) 3 pound weight gain in 24 hours or 5 pounds in 1 week  2) shortness of breath, with or without a dry hacking cough  3) swelling in the hands, feet or stomach  4) if you have to sleep on extra pillows at night in order to breathe  Follow cardiac low salt diet and 1.5 lit/day fluid restriction.  If you have diabetes Accuchecks 4 times/day, Once in AM empty stomach and then before each meal. Log in all results and show them to your primary doctor at your next visit. If any glucose reading is under 80 or above 300 call your primary MD immediately.  If you have Seizure/Convulsions/Epilepsy: Please do not drive, operate heavy machinery, participate in activities at heights or participate in high speed sports until you have seen by Primary MD or a Neurologist and advised to do so again.  If you had Gastrointestinal Bleeding: Please ask your Primary MD to check a complete blood count within one week of discharge or at your next visit. Your endoscopic/colonoscopic biopsies that are pending at the time of discharge, will also need to followed by your Primary MD.  Get Medicines reviewed and adjusted. Please take all your medications with  you for your next visit with your Primary MD  Please request your Primary MD to go over all hospital tests and procedure/radiological results at the follow up, please ask your Primary MD to get all Hospital records sent to his/her office.  If you experience worsening of your admission symptoms, develop shortness of breath, life threatening emergency, suicidal or homicidal thoughts you must seek medical attention immediately by calling 911 or calling your MD immediately  if symptoms less severe.  You must read complete instructions/literature along with all the possible adverse reactions/side effects for all the Medicines you take and that have been prescribed to you. Take any new Medicines after you have completely understood and accpet all the possible adverse reactions/side effects.   Do not drive or operate heavy machinery when taking Pain medications.   Do not take more than prescribed Pain, Sleep and Anxiety Medications  Special Instructions: If you have smoked or chewed Tobacco  in the last 2 yrs please stop smoking, stop any regular Alcohol  and or any Recreational drug use.  Wear Seat belts while driving.  Please note You were cared for by a hospitalist during your hospital stay. If you have any questions about your discharge medications or the care you received while you were in the hospital after you are discharged, you can call the unit and asked to speak with the hospitalist on call if the hospitalist that took care of you is not available. Once you are  discharged, your primary care physician will handle any further medical issues. Please note that NO REFILLS for any discharge medications will be authorized once you are discharged, as it is imperative that you return to your primary care physician (or establish a relationship with a primary care physician if you do not have one) for your aftercare needs so that they can reassess your need for medications and monitor your lab  values.  You can reach the hospitalist office at phone [phone] or fax [phone]   If you do not have a primary care physician, you can call [phone] for a physician referral.  Activity: As tolerated with Full fall precautions use walker/cane & assistance as needed  Diet: regular  Disposition Home

## 2015-10-02 NOTE — Discharge Summary (Signed)
Physician Discharge Summary  ANDREE HEEG XHB:[id number] DOB: 1970-03-22 DOA: 09/29/2015  PCP: Pcp Not In System  Admit date: 09/29/2015 Discharge date: 10/02/2015  Admitted From: home Disposition:  home  Recommendations for Outpatient Follow-up:  1. Follow up with [facility] in 1-2 weeks  Home Health: none Equipment/Devices: none  Discharge Condition: stable CODE STATUS: Full Diet recommendation: regular  HPI: Per Dr. Jonnie Finner, Sheryl Mathis is a 45 y.o. female w hx depression, endometrial cancer who recently had medication changes (psych medications) and presenting with abnormal uncontrollable movements including tremors, stuttering speech and "twitching" of her arms and neck. Her adopted mother and the patient provide the history.  She has long hx psych issues and didn't respond to Prozac according to the mother so about 4-5 weeks ago she was started on a new medication called Rexulti.  She "did well" on it for about 3 weeks, but then last week started to have tremors of her arms and her neck.  This got worse and started to have difficulty speaking w stuttering and so on Sunday 4 days ago she called [facility] and they told her to stop the medication, Rexulti.  She didn't get better and so went in on Tuesday and was given cogentin and started on Zoloft.  She took the new meds but isn't getting better so came to ED.    She denies any fever, chills, abd pain, no n/v/d, no CP, no SOB.  No HA or paralysis.    Hospital Course: Discharge Diagnoses:  Principal Problem:   MDD (major depressive disorder), recurrent severe, without psychosis ([place]) Active Problems:   History of uterine cancer   Acute neuroleptic-induced dystonia   Medication reaction   Acute dystonia due to drugs  Acute dystonic reaction - due to Rexulti, a new atypical antipsychotic agents, stopped 4-5 days prior to admission. It has long half-life 4 days. Patient received IV Cogentin 2 mg in the ER, placed on 1 mg twice daily  IV, transition to 1 mg po BID, low-dose Benadryl as needed. Neurology as well as psychiatry consulted and evaluated patient. She responded well to cogentin, to continue for at least 3 more weeks, will use Clonazepam PRN at home as well per psychiatry recommendations. Patient will call [facility] on Monday to set up an appointment in 1-2 weeks Chronic severe depression  - psychiatry consulted, stable Hx uterine cancer  - rx w surgery/ brachytherapy  Hypokalemia - resolved   Discharge Instructions     Medication List    TAKE these medications   benztropine 1 MG tablet Commonly known as:  COGENTIN Take 1 tablet (1 mg total) by mouth 2 (two) times daily.   busPIRone 10 MG tablet Commonly known as:  BUSPAR Take 10 mg by mouth 3 (three) times daily.   clonazePAM 0.5 MG tablet Commonly known as:  KLONOPIN Take 1 tablet (0.5 mg total) by mouth 3 (three) times daily as needed (muscle spasms / twitching / anxiety).   DULoxetine 60 MG capsule Commonly known as:  CYMBALTA Take 1 capsule (60 mg total) by mouth 2 (two) times daily. For depression   gabapentin 300 MG capsule Commonly known as:  NEURONTIN Take 300 mg by mouth 3 (three) times daily.   hydrOXYzine 25 MG tablet Commonly known as:  ATARAX/VISTARIL Take 1 tablet (25 mg total) by mouth every 6 (six) hours as needed for anxiety. What changed:  how much to take  when to take this   ibuprofen 800 MG tablet Commonly known  as:  ADVIL,MOTRIN Take 800 mg by mouth every 8 (eight) hours as needed for headache, mild pain or moderate pain.   loratadine 10 MG tablet Commonly known as:  CLARITIN Take 1 tablet (10 mg total) by mouth daily. For allergies   omeprazole 20 MG capsule Commonly known as:  PRILOSEC Take 1 capsule (20 mg total) by mouth daily. For acid reflux   sertraline 25 MG tablet Commonly known as:  ZOLOFT Take 25 mg by mouth daily.   sulfamethoxazole-trimethoprim 800-160 MG tablet Commonly known as:  BACTRIM  DS,SEPTRA DS Take 1 tablet by mouth 2 (two) times daily.   topiramate 25 MG tablet Commonly known as:  TOPAMAX Take 2 tablets (50 mg total) by mouth 2 (two) times daily. For migraine headache pain      Follow-up Information    [facility]. Schedule an appointment as soon as possible for a visit in 2 week(s).   Specialty:  [facility] information: [place] [address] [phone]          Allergies  Allergen Reactions  . Rexulti [Brexpiprazole] Other (See Comments)    Acute severe dystonic reaction (neck/ arm spasms, stuttering speech), Jul 2017  . Fluoxetine Other (See Comments)    Paranoia, altered mental status  . Hydrocodone-Acetaminophen Other (See Comments)    Too strong    Consultations:    Procedures/Studies:  No results found.   Subjective: - no chest pain, shortness of breath, no abdominal pain, nausea or vomiting.   Discharge Exam: Vitals:   10/01/15 2214 10/02/15 0609  BP: 124/75 (!) 123/96  Pulse: 66 66  Resp: 20 20  Temp: 98 F (36.7 C) 98.4 F (36.9 C)   Vitals:   09/30/15 2106 10/01/15 0632 10/01/15 2214 10/02/15 0609  BP: 129/82 127/83 124/75 (!) 123/96  Pulse: 70 63 66 66  Resp: '20 20 20 20  '$ Temp: 98.3 F (36.8 C) 97.9 F (36.6 C) 98 F (36.7 C) 98.4 F (36.9 C)  TempSrc: Oral Oral Axillary Axillary  SpO2: 97% 99% 98% 100%  Weight:      Height:        General: Pt is alert, awake, not in acute distress Cardiovascular: RRR, S1/S2 +, no rubs, no gallops Respiratory: CTA bilaterally, no wheezing, no rhonchi    The results of significant diagnostics from this hospitalization (including imaging, microbiology, ancillary and laboratory) are listed below for reference.     Microbiology: No results found for this or any previous visit (from the past 240 hour(s)).   Labs: BNP (last 3 results) No results for input(s): BNP in the last 8760 hours. Basic Metabolic Panel:  Recent Labs Lab 09/29/15 1900  09/30/15 0543 10/01/15 0544 10/02/15 0525  NA 130* 136 136 135  K 2.4* 2.9* 4.1 3.8  CL 92* 101 107 106  CO2 '26 27 23 23  '$ GLUCOSE 138* 148* 122* 130*  BUN '15 12 11 12  '$ CREATININE 0.98 0.85 0.79 0.69  CALCIUM 9.2 8.7* 8.7* 8.9  MG 2.2  --   --   --    Liver Function Tests:  Recent Labs Lab 09/29/15 1900  AST 75*  ALT 95*  ALKPHOS 99  BILITOT 0.5  PROT 8.4*  ALBUMIN 4.2   No results for input(s): LIPASE, AMYLASE in the last 168 hours. No results for input(s): AMMONIA in the last 168 hours. CBC:  Recent Labs Lab 09/29/15 1900  WBC 9.4  NEUTROABS 4.7  HGB 13.9  HCT 39.8  MCV 83.6  PLT 398   Cardiac Enzymes:  Recent Labs Lab 09/29/15 1900  CKTOTAL 107   BNP: Invalid input(s): POCBNP CBG: No results for input(s): GLUCAP in the last 168 hours. D-Dimer No results for input(s): DDIMER in the last 72 hours. Hgb A1c No results for input(s): HGBA1C in the last 72 hours. Lipid Profile No results for input(s): CHOL, HDL, LDLCALC, TRIG, CHOLHDL, LDLDIRECT in the last 72 hours. Thyroid function studies No results for input(s): TSH, T4TOTAL, T3FREE, THYROIDAB in the last 72 hours.  Invalid input(s): FREET3 Anemia work up No results for input(s): VITAMINB12, FOLATE, FERRITIN, TIBC, IRON, RETICCTPCT in the last 72 hours. Urinalysis    Component Value Date/Time   COLORURINE YELLOW 09/29/2015 [place] 09/29/2015 1851   LABSPEC 1.016 09/29/2015 1851   LABSPEC 1.010 12/16/2014 1408   PHURINE 5.5 09/29/2015 1851   GLUCOSEU NEGATIVE 09/29/2015 1851   GLUCOSEU Negative 12/16/2014 1408   HGBUR NEGATIVE 09/29/2015 1851   BILIRUBINUR NEGATIVE 09/29/2015 1851   BILIRUBINUR Negative 12/16/2014 1408   KETONESUR NEGATIVE 09/29/2015 1851   PROTEINUR NEGATIVE 09/29/2015 1851   UROBILINOGEN 1.0 12/23/2014 1303   UROBILINOGEN 0.2 12/16/2014 1408   NITRITE NEGATIVE 09/29/2015 1851   LEUKOCYTESUR NEGATIVE 09/29/2015 1851   LEUKOCYTESUR Negative 12/16/2014  1408   Sepsis Labs Invalid input(s): PROCALCITONIN,  WBC,  LACTICIDVEN Microbiology No results found for this or any previous visit (from the past 240 hour(s)).   Time coordinating discharge: < 30 minutes  SIGNED:  Marzetta Board, MD  [facility] 10/02/2015, 3:31 PM Pager [phone]  If 7PM-7AM, please contact night-coverage [email or web address] Password [email or web address]

## 2015-10-04 ENCOUNTER — Emergency Department (HOSPITAL_BASED_OUTPATIENT_CLINIC_OR_DEPARTMENT_OTHER)
Admission: EM | Admit: 2015-10-04 | Discharge: 2015-10-04 | Disposition: A | Discharge status: Home / Self Care | Payer: Self-pay | Attending: Emergency Medicine | Admitting: Emergency Medicine

## 2015-10-04 ENCOUNTER — Encounter (HOSPITAL_BASED_OUTPATIENT_CLINIC_OR_DEPARTMENT_OTHER): Payer: Self-pay | Admitting: *Deleted

## 2015-10-04 DIAGNOSIS — Z87891 Personal history of nicotine dependence: Secondary | ICD-10-CM | POA: Insufficient documentation

## 2015-10-04 DIAGNOSIS — J029 Acute pharyngitis, unspecified: Secondary | ICD-10-CM | POA: Insufficient documentation

## 2015-10-04 DIAGNOSIS — Z8542 Personal history of malignant neoplasm of other parts of uterus: Secondary | ICD-10-CM | POA: Insufficient documentation

## 2015-10-04 LAB — RAPID STREP SCREEN (MED CTR MEBANE ONLY): Streptococcus, Group A Screen (Direct): NEGATIVE

## 2015-10-04 MED ORDER — NAPROXEN 500 MG PO TABS: 500.0000 mg | ORAL_TABLET | Freq: Two times a day (BID) | ORAL | 1 refills | Status: DC

## 2015-10-04 NOTE — ED Provider Notes (Signed)
[facility] DEPT [facility] Provider Note   CSN: [id number] Arrival date & time: 10/04/15  1236  First Provider Contact:  First MD Initiated Contact with Patient 10/04/15 1254        History   Chief Complaint Chief Complaint  Patient presents with  . Thrush    HPI Sheryl Mathis is a 45 y.o. female.  Patient with recent discharge from [facility]. Patient started with some throat discomfort on Sunday. Today it's worse. Patient noticed a white coating on her tongue is concerned that she has a thrush infection. Patient currently is on Septra for a healing abscess. Patient denies cough or nasal congestion no fevers. Patient has not had a history of strep throat in the past. Patient states it is painful to swallow.   The history is provided by the patient and a relative.    Past Medical History:  Diagnosis Date  . Anemia   . Anxiety   . Cancer [facility])    ENDOMETRIAL CANCER (tx completed 05/2013)  . Complication of anesthesia    PT STATES SHE WOKE UP AFTER D&C [place].  STATES NO PROBLEMS WAKING UP AFTER HER HYSTERECTOMY  . Depression   . Headache(784.0)    MIGRAINES  . History of radiation therapy 04/23/13, 04/29/13, 05/07/13, 05/14/13, 05/21/13   30 Gy to proximal vagina  . Menorrhagia   . Migraines   . Neuropathy [facility])     Patient Active Problem List   Diagnosis Date Noted  . 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 Medication reaction 09/29/2015  . Acute dystonia due to drugs 09/29/2015  . Severe episode of recurrent major depressive disorder, without psychotic features ([place])   . MDD (major depressive disorder), recurrent episode, severe ([place]) 03/01/2015  . Vasomotor flushing 09/02/2014  . Vaginal candidiasis 09/02/2014  . Panic disorder 04/25/2014  . MDD (major depressive disorder), recurrent severe, without psychosis ([place]) 04/24/2014  . Obesity (BMI 30-39.9) 02/01/2014  . Uterine cancer ([place]) 03/31/2013  . History of uterine cancer  02/02/2013  . S/P laparoscopic assisted vaginal hysterectomy (LAVH) and bilateral salpingectomy (BS) on 01/22/13 01/22/2013    Past Surgical History:  Procedure Laterality Date  . CESAREAN SECTION     X2  . CHOLECYSTECTOMY    . DILITATION & CURRETTAGE/HYSTROSCOPY WITH HYDROTHERMAL ABLATION N/A 01/01/2013   Procedure: DILATATION & CURETTAGE/HYSTEROSCOPY WITH ATTEMPTED HYDROTHERMAL ABLATION: Change over to [place] @ 4081;  Surgeon: Osborne Oman, MD;  Location: [facility];  Service: Gynecology;  Laterality: N/A;  . LAPAROSCOPIC ASSISTED VAGINAL HYSTERECTOMY N/A 01/22/2013   Procedure: LAPAROSCOPIC ASSISTED VAGINAL HYSTERECTOMY with Bilateral Fallopian Tubes and Ovaries;  Surgeon: Osborne Oman, MD;  Location: [facility];  Service: Gynecology;  Laterality: N/A;  . ROBOTIC ASSISTED TOTAL HYSTERECTOMY WITH BILATERAL SALPINGO OOPHERECTOMY Bilateral 03/31/2013   Procedure: ROBOTIC ASSISTED BILATERAL OOPHORECTOMY PELVIC AND POSSIBLE PARA-AORTIC LYMPH NODE DISSECTION;  Surgeon: Imagene Gurney A. Alycia Rossetti, MD;  Location: [facility];  Service: Gynecology;  Laterality: Bilateral;  . TUBAL LIGATION      OB History    Gravida Para Term Preterm AB Living   '2 2 2     2   '$ SAB TAB Ectopic Multiple Live Births                   Home Medications    Prior to Admission medications   Medication Sig Start Date End Date Taking? Authorizing Provider  benztropine (COGENTIN) 1 MG tablet Take 1 tablet (1 mg total) by mouth  2 (two) times daily. 10/02/15   Costin Karlyne Greenspan, MD  busPIRone (BUSPAR) 10 MG tablet Take 10 mg by mouth 3 (three) times daily.    Historical Provider, MD  clonazePAM (KLONOPIN) 0.5 MG tablet Take 1 tablet (0.5 mg total) by mouth 3 (three) times daily as needed (muscle spasms / twitching / anxiety). 10/02/15   Costin Karlyne Greenspan, MD  DULoxetine (CYMBALTA) 60 MG capsule Take 1 capsule (60 mg total) by mouth 2 (two) times daily. For depression 03/07/15   Encarnacion Slates, NP  gabapentin (NEURONTIN) 300 MG  capsule Take 300 mg by mouth 3 (three) times daily.    Historical Provider, MD  hydrOXYzine (ATARAX/VISTARIL) 25 MG tablet Take 1 tablet (25 mg total) by mouth every 6 (six) hours as needed for anxiety. Patient taking differently: Take 25-50 mg by mouth at bedtime as needed for anxiety.  03/07/15   Encarnacion Slates, NP  ibuprofen (ADVIL,MOTRIN) 800 MG tablet Take 800 mg by mouth every 8 (eight) hours as needed for headache, mild pain or moderate pain.    Historical Provider, MD  loratadine (CLARITIN) 10 MG tablet Take 1 tablet (10 mg total) by mouth daily. For allergies 03/07/15   Encarnacion Slates, NP  naproxen (NAPROSYN) 500 MG tablet Take 1 tablet (500 mg total) by mouth 2 (two) times daily. 10/04/15   Fredia Sorrow, MD  omeprazole (PRILOSEC) 20 MG capsule Take 1 capsule (20 mg total) by mouth daily. For acid reflux 03/07/15   Encarnacion Slates, NP  sertraline (ZOLOFT) 25 MG tablet Take 25 mg by mouth daily.    Historical Provider, MD  sulfamethoxazole-trimethoprim (BACTRIM DS,SEPTRA DS) 800-160 MG tablet Take 1 tablet by mouth 2 (two) times daily. 09/27/15 10/04/15  Fransico Meadow, PA-C  topiramate (TOPAMAX) 25 MG tablet Take 2 tablets (50 mg total) by mouth 2 (two) times daily. For migraine headache pain 03/07/15   Encarnacion Slates, NP    Family History Family History  Problem Relation Age of Onset  . Diabetes Mother   . Hypertension Mother   . Cancer Neg Hx     Social History Social History  Substance Use Topics  . Smoking status: Former Smoker    Packs/day: 0.50    Years: 26.00    Types: Cigarettes    Quit date: 08/03/2013  . Smokeless tobacco: Never Used  . Alcohol use No     Allergies   Rexulti [brexpiprazole]; Fluoxetine; and Hydrocodone-acetaminophen   Review of Systems Review of Systems  Constitutional: Negative for fever.  HENT: Positive for sore throat and trouble swallowing. Negative for congestion.   Eyes: Negative for redness.  Respiratory: Negative for cough.   Cardiovascular:  Negative for chest pain.  Gastrointestinal: Negative for abdominal pain, diarrhea, nausea and vomiting.  Genitourinary: Negative for dysuria.  Musculoskeletal: Negative for neck stiffness.  Skin: Negative for rash.  Neurological: Negative for headaches.  Hematological: Does not bruise/bleed easily.  Psychiatric/Behavioral: Negative for confusion.     Physical Exam Updated Vital Signs BP 116/63   Pulse 80   Temp 98 F (36.7 C) (Oral)   Resp 18   Ht '5\' 7"'$  (1.702 m)   Wt 130.6 kg   LMP 11/03/2012   SpO2 99%   BMI 45.11 kg/m   Physical Exam  Constitutional: She is oriented to person, place, and time. She appears well-developed and well-nourished. No distress.  HENT:  Head: Normocephalic and atraumatic.  Mouth/Throat: Oropharynx is clear and moist. No oropharyngeal exudate.  Slight  white coating on the tong no white coating on the other mucosal surfaces. Uvula midline tonsils nonenlarged no exudate no significant tonsillar swelling.  Eyes: Conjunctivae and EOM are normal. Pupils are equal, round, and reactive to light.  Neck: Normal range of motion. Neck supple.  Cardiovascular: Normal rate, regular rhythm and normal heart sounds.   Pulmonary/Chest: Effort normal and breath sounds normal. No respiratory distress. She has no wheezes.  Abdominal: Soft. Bowel sounds are normal. There is no tenderness.  Musculoskeletal: Normal range of motion.  Neurological: She is alert and oriented to person, place, and time. No cranial nerve deficit. She exhibits normal muscle tone. Coordination normal.  Skin: Skin is warm. No erythema.  Nursing note and vitals reviewed.    ED Treatments / Results  Labs (all labs ordered are listed, but only abnormal results are displayed) Labs Reviewed  RAPID STREP SCREEN (NOT AT [facility])  CULTURE, GROUP A STREP [facility])    EKG  EKG Interpretation None       Radiology No results found.  Procedures Procedures (including critical care  time)  Medications Ordered in ED Medications - No data to display   Initial Impression / Assessment and Plan / ED Course  I have reviewed the triage vital signs and the nursing notes.  Pertinent labs & imaging results that were available during my care of the patient were reviewed by me and considered in my medical decision making (see chart for details).  Clinical Course    The patient clinically was concerned about having thrush. But suspect this is more consistent with a viral pharyngitis. Patient with a slight white coating on the tongue no evidence of any white coating on the buccal mucosa or the back of the throat. Tonsils nonenlarged uvula midline. Rapid strep was negative. Will treat as a viral pharyngitis with Naprosyn.  Final Clinical Impressions(s) / ED Diagnoses   Final diagnoses:  Pharyngitis    New Prescriptions New Prescriptions   NAPROXEN (NAPROSYN) 500 MG TABLET    Take 1 tablet (500 mg total) by mouth 2 (two) times daily.     Fredia Sorrow, MD 10/04/15 1424

## 2015-10-04 NOTE — ED Triage Notes (Signed)
Possible thrush.

## 2015-10-04 NOTE — Discharge Instructions (Signed)
Rapid strep test was negative. Take the Naprosyn as directed. Return for any new or worse symptoms.

## 2015-10-04 NOTE — ED Notes (Signed)
Patient family member came to nurse desk, asking how much longer because she has stuff to do. Notified nurse and MD.

## 2015-10-07 LAB — CULTURE, GROUP A STREP (THRC)

## 2015-12-01 ENCOUNTER — Other Ambulatory Visit (HOSPITAL_COMMUNITY)
Admission: RE | Admit: 2015-12-01 | Discharge: 2015-12-01 | Disposition: A | Discharge status: Home / Self Care | Payer: Self-pay | Source: Ambulatory Visit | Attending: Gynecologic Oncology | Admitting: Gynecologic Oncology

## 2015-12-01 ENCOUNTER — Ambulatory Visit: Payer: Self-pay | Attending: Gynecologic Oncology | Admitting: Gynecologic Oncology

## 2015-12-01 ENCOUNTER — Encounter: Payer: Self-pay | Admitting: Gynecologic Oncology

## 2015-12-01 VITALS — BP 122/74 | HR 72 | Temp 98.3°F | Resp 18 | Ht 67.0 in | Wt 291.9 lb

## 2015-12-01 DIAGNOSIS — N83202 Unspecified ovarian cyst, left side: Secondary | ICD-10-CM | POA: Insufficient documentation

## 2015-12-01 DIAGNOSIS — Z90710 Acquired absence of both cervix and uterus: Secondary | ICD-10-CM | POA: Insufficient documentation

## 2015-12-01 DIAGNOSIS — E669 Obesity, unspecified: Secondary | ICD-10-CM | POA: Insufficient documentation

## 2015-12-01 DIAGNOSIS — Z01411 Encounter for gynecological examination (general) (routine) with abnormal findings: Secondary | ICD-10-CM | POA: Insufficient documentation

## 2015-12-01 DIAGNOSIS — N939 Abnormal uterine and vaginal bleeding, unspecified: Secondary | ICD-10-CM | POA: Insufficient documentation

## 2015-12-01 DIAGNOSIS — R1013 Epigastric pain: Secondary | ICD-10-CM

## 2015-12-01 DIAGNOSIS — Z9889 Other specified postprocedural states: Secondary | ICD-10-CM | POA: Insufficient documentation

## 2015-12-01 DIAGNOSIS — R110 Nausea: Secondary | ICD-10-CM

## 2015-12-01 DIAGNOSIS — C55 Malignant neoplasm of uterus, part unspecified: Secondary | ICD-10-CM | POA: Insufficient documentation

## 2015-12-01 DIAGNOSIS — F419 Anxiety disorder, unspecified: Secondary | ICD-10-CM | POA: Insufficient documentation

## 2015-12-01 DIAGNOSIS — N83201 Unspecified ovarian cyst, right side: Secondary | ICD-10-CM | POA: Insufficient documentation

## 2015-12-01 DIAGNOSIS — C541 Malignant neoplasm of endometrium: Secondary | ICD-10-CM

## 2015-12-01 DIAGNOSIS — Z9049 Acquired absence of other specified parts of digestive tract: Secondary | ICD-10-CM | POA: Insufficient documentation

## 2015-12-01 DIAGNOSIS — F329 Major depressive disorder, single episode, unspecified: Secondary | ICD-10-CM | POA: Insufficient documentation

## 2015-12-01 NOTE — Patient Instructions (Addendum)
CT abd/pelvis on December 08, 2015 at 11am, arrive at 10:45am, nothing to eat or drink 4 hours before. Will refer for hernia repair based on CT findings Follow-up 05/2016.  Please call our office in November or December to schedule. We will also set up a mammogram for you. We will call you with the results of your pap smear from today.  Hernia A hernia happens when an organ or tissue inside your body pushes out through a weak spot in the belly (abdomen). HOME CARE  Avoid stretching or overusing (straining) the muscles near the hernia.  Do not lift anything heavier than 10 lb (4.5 kg).  Use the muscles in your leg when you lift something up. Do not use the muscles in your back.  When you cough, try to cough gently.  Eat a diet that has a lot of fiber. Eat lots of fruits and vegetables.  Drink enough fluids to keep your pee (urine) clear or pale yellow. Try to drink 6-8 glasses of water a day.  Take medicines to make your poop soft (stool softeners) as told by your doctor.  Lose weight, if you are overweight.  Do not use any tobacco products, including cigarettes, chewing tobacco, or electronic cigarettes. If you need help quitting, ask your doctor.  Keep all follow-up visits as told by your doctor. This is important. GET HELP IF:  The skin by the hernia gets puffy (swollen) or red.  The hernia is painful. GET HELP RIGHT AWAY IF:  You have a fever.  You have belly pain that is getting worse.  You feel sick to your stomach (nauseous) or you throw up (vomit).  You cannot push the hernia back in place by gently pressing on it while you are lying down.  The hernia:  Changes in shape or size.  Is stuck outside your belly.  Changes color.  Feels hard or tender.   This information is not intended to replace advice given to you by your health care provider. Make sure you discuss any questions you have with your health care provider.   Document Released: 08/09/2009 Document  Revised: 03/12/2014 Document Reviewed: 12/30/2013 Elsevier Interactive Patient Education [employer].
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
## 2015-12-01 NOTE — Addendum Note (Signed)
Addended by: [name] D on: 12/01/2015 03:39 PM   Modules accepted: Orders

## 2015-12-05 ENCOUNTER — Telehealth: Payer: Self-pay

## 2015-12-05 LAB — CYTOLOGY - PAP

## 2015-12-05 NOTE — Telephone Encounter (Signed)
Orders received from [facility] to contact the patient to update with PAP being "normal" collected on 12/01/2015 during her visit with Dr Janie Morning. Patient contacted and updated , patient states understanding ,denies further questions at this time.

## 2015-12-08 ENCOUNTER — Ambulatory Visit (HOSPITAL_COMMUNITY)
Admission: RE | Admit: 2015-12-08 | Discharge: 2015-12-08 | Disposition: A | Discharge status: Home / Self Care | Payer: Self-pay | Source: Ambulatory Visit | Attending: Gynecologic Oncology | Admitting: Gynecologic Oncology

## 2015-12-08 DIAGNOSIS — K429 Umbilical hernia without obstruction or gangrene: Secondary | ICD-10-CM | POA: Insufficient documentation

## 2015-12-08 DIAGNOSIS — R1013 Epigastric pain: Secondary | ICD-10-CM | POA: Insufficient documentation

## 2015-12-08 DIAGNOSIS — K760 Fatty (change of) liver, not elsewhere classified: Secondary | ICD-10-CM | POA: Insufficient documentation

## 2015-12-08 DIAGNOSIS — R110 Nausea: Secondary | ICD-10-CM | POA: Insufficient documentation

## 2015-12-08 MED ORDER — IOPAMIDOL (ISOVUE-300) INJECTION 61%
100.0000 mL | Freq: Once | INTRAVENOUS | Status: AC | PRN
  Administered 2015-12-08: 100 mL via INTRAVENOUS

## 2015-12-09 ENCOUNTER — Telehealth: Payer: Self-pay | Admitting: Gynecologic Oncology

## 2015-12-09 ENCOUNTER — Telehealth: Payer: Self-pay

## 2015-12-09 NOTE — Telephone Encounter (Signed)
Orders received from [facility] to contact the patient to update with results from CT of Abdomen and Pelvis. Impression , " no acute process within the abdomen and pelvis"."there is a small bowel containing periumbilical hernia without evidence of obstruction". Patient contacted and updated , patient asking if she needs to continue with 10 lbs weight restriction that Dr Janie Morning discussed with her during her last visit. Melissa Cross, APNP updated , recommendations : "dont lift any thing to heavy that would require putting stress to her abdominal muscles". Patient updated and states understanding , patient will call with additional changes , questions or concerns. Patient also wanted Melissa cross, APNP that her PCP got her in for a "amamogram" on November 1 , 2017 and that B-Sep program is not required at this time. Melissa Cross, APNP aware.

## 2015-12-09 NOTE — Telephone Encounter (Signed)
Left message with Gabriel Cirri in the [facility] program about what steps would need to be taken for the patient to have a mammogram.  She was previously in the [facility] program but her card expired.

## 2015-12-09 NOTE — Telephone Encounter (Signed)
Left message for Gabriel Cirri with the [facility] program about patient needing to have mammogram.  She was in the [facility] program previously but states her card expired.  For primary insurance, Medicaid is listed but under media the patient only has the [facility] card that expired in 05/2015.

## 2015-12-13 ENCOUNTER — Telehealth (HOSPITAL_COMMUNITY): Payer: Self-pay | Admitting: *Deleted

## 2015-12-13 NOTE — Telephone Encounter (Signed)
Telephoned patient at home number and left message to return call to [facility] 

## 2016-05-21 NOTE — Progress Notes (Signed)
GYN ONCOLOGY OFFICE VISIT   CC:  Endometrial cancer surveillance,   Assessment/Plan:  [name]  is a 46 y.o.  year old status post laparoscopic hysterectomy bilateral salpingectomy by Dr. Harolyn Rutherford  for abnormal uterine bleeding with 2 prior endometrial sampling that were negative for malignancy. The specimen was morcellated into 4 pieces to facilitate removal from the vagina.  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 Based on the grade and lymphovascular space involvement is recommended that she undergo vaginal cuff brachytherapy.Treatment was completed May 21, 2013.  Abnormal pap 12/2013 LSIL HPV+ colpo neg Pap 05/2014 ASCUS + hrHPV pap 12/2014 neg hrHPV neg  Pap 11/2015 wnl   Follow-up with Dr. Skeet Latch 11/2016  Symptomatic hernia CT abd/pelvis confirms  Sheryl Mathis has not followed-up with our referral to general surgery if hernia Advised to lose weight. Hernia precautions provided   HPI: [name]  is a 46 y.o. Mar[place] KitchenG2 P2 last normal menstrual period in 11/03/2012. She reports abnormal uterine bleeding that began approximately 6 months ago. It became very heavy in September 2014 when she presented for evaluation and was initially placed on Megace.  12/01/12 ECC - BENIGN ENDOCERVICAL MUCOSA, - DETACHED FRAGMENTS OF SQUAMOUS EPITHELIUM; NEGATIVE FOR INTRAEPITHELIAL LESION OR MALIGANNCY. Endometrium, biopsy - FRAGMENTS OF ENDOMETRIOID-TYPE POLYP(S) WITH DECIDUALIZATION OF THE STROMA, CONSISTENT WITH HORMONE EFFECT. - THERE IS NO EVIDENCE OF HYPERPLASIA OR MALIGNANCY. Repeat biopsy on 01/02/2013 Endometrium, curettage - FRAGMENTS SUGGESTIVE OF ENDOMETRIOID TYPE POLYP(S) WITH DECIDUALIZATION OF THE STROMA, CONSISTENT WITH HORMONE  EFFECT. - BENIGN ENDOCERVICAL TYPE MUCOSA. - THERE IS NO EVIDENCE OF HYPERPLASIA OR MALIGNANCY.  On 01/22/2013 she underwent a total laparoscopic hysterectomy unilateral salpingectomy with "some  vaginal morcellation to deliver the uterus vaginally after the hysterectomy was completed"  Uterus and bilateral fallopian tubes, with cervix ( 4 sections) Specimen: Uterus, cervix, one fallopian tube Procedure: Hysterectomy and salpingectomy Lymph node sampling performed: No Specimen integrity: Fragmented, please see gross description for details. Maximum tumor size: Estimated size 6.5 cm, grossly Histologic type: Invasive endometrioid carcinoma Grade: FIGO Grade II Myometrial invasion: 0.9 cm where myometrium is 2.4 cm in thickness Cervical stromal involvement: No Lymph - vascular invasion: Present, extensively FIGO Stage (based on pathologic findings, needs clinical correlation): At least IA Comment:  Sections show an invasive endometrioid carcinoma involving the upper portion of the uterus, estimated 6.5 cm in greatest dimension. There is extensive angiolymphatic invasion present. IHC Expression Result: MLH1: Preserved nuclear expression (greater 50% tumor expression) MSH2: Preserved nuclear expression (greater 50% tumor expression) MSH6: Preserved nuclear expression (greater 50% tumor expression) PMS2: Preserved nuclear expression (greater 50% tumor expression) * Internal control demonstrates intact nuclear expression  Discussion with Dr. Harolyn Rutherford confirmed that there was vaginal not intra-abdominal morcellation   CT 12/17/2015to assess for nodal or intraperioneal disease notable for no retroperitoneal or retrocrural adenopathy. Normal colon, appendix, and terminal ileum. Normal small bowel without abdominal ascites. No evidence of omental  or peritoneal disease. No pelvic adenopathy. Residual normal appearing left ovary on image 72 and right ovary on image 76. No adnexal mass. There is mild  pelvic fascia thickening and trace fluid which is presumably postoperative.   On 03/31/13 she underwent: Robotic bilateral oophorectomy, left salpingectomy, bilateral pelvic lymph node dissection, right PA node sampling   Pathology: Bilateral ovaries,  left fallopian tube, Right para-aortic lymph nodes, bilateral pelvic lymph nodes to pathology.   Operative findings: Status post hysterectomy with mild adhesive disease of the rectosigmoid colon to the left adnexa. Significant intra-abdominal obesity. Prominent bilateral pelvic lymph nodes.  Pathology revealed: Diagnosis 1. Ovary and fallopian tube, left - BENIGN OVARY WITH CYSTIC FOLLICLES. - SURFACE FOREIGN BODY GIANT CELL REACTION. - THERE IS NO EVIDENCE OF MALIGNANCY. - SEE COMMENT. 2. Ovary and fallopian tube, right - BENIGN OVARY WITH CYSTIC FOLLICLES. - THERE IS NO EVIDENCE OF MALIGNANCY. - SEE COMMENT. 3. Lymph nodes, regional resection, right pelvic - THERE IS NO EVIDENCE OF CARCINOMA IN 10 OF 10 LYMPH NODES (0/10). 4. Lymph nodes, regional resection, left pelvic - THERE IS NO EVIDENCE OF CARCINOMA IN 7 OF 7 LYMPH NODES (0/7). 5. Lymph nodes, regional resection, right para-aortic - THERE IS NO EVIDENCE OF CARCINOMA IN 1 OF 1 LYMPH NODE (0/1).  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   Given extensive LVSI adjuvant bracytherapy was recommended. Radiation treatment dates: February 19th, February 25, March 5, March 12, May 21, 2013  Site/dose: Proximal vagina 30 Gy in 5 fractions  Beams/energy: Patient was treated with intracavitary brachytherapy treatments alone using iridium 192 as the high-dose-rate source. The patient was treated with a 3 cm diameter cylinder. Prescription was to the mucosal surface. Treatment length was 4 cm  Pap 12/27/2013 LSIL +hrHPV DNA Colposcopy 01/2014 Vagina, biopsy - BENIGN SQUAMOUS MUCOSA. - NO DYSPLASIA OR EVIDENCE OF  MALIGNANCY. Repeat pap 05/2014 ASCUCs HPV+ Abnormal pap 12/2013 LSIL HPV+ colpo neg  Pap 11/2015 wnl   Interval Hx: Reported umbilical pain at visit 03/5174 CT A/P 12/2015 IMPRESSION: No acute process within the abdomen or pelvis.  There is a small bowel containing periumbilical hernia without evidence for obstruction.  Extensive hepatic steatosis.  ROS:  No cough periumbilical abdominal pain intermittent , abdominal pain with nausea for the past few weeks .  Reports weight gain because of psychiatric medications, mood and mental state are stable, no vaginal bleeding no hematochezia or hematuria no changes in bowel or bladder habits, no adenopathy.  No fever or chills,  Otherwise 10 point review of systems negative  Social Hx:   Social History   Social History  . Marital status: Divorced    Spouse name: N/A  . Number of children: N/A  . Years of education: N/A   Occupational History  . Not on file.   Social History Main Topics  . Smoking status: Former Smoker    Packs/day: 0.50    Years: 26.00    Types: Cigarettes    Quit date: 08/03/2013  . Smokeless tobacco: Never Used  . Alcohol use No  . Drug use: No  . Sexual activity: Not Currently    Birth control/ protection: Surgical   Other Topics Concern  . Not on file   Social History Narrative  . No narrative on file  Stopped smoking.   Past Surgical Hx:  Past Surgical History:  Procedure Laterality Date  . CESAREAN SECTION     X2  . CHOLECYSTECTOMY    . DILITATION & CURRETTAGE/HYSTROSCOPY WITH HYDROTHERMAL ABLATION N/A 01/01/2013   Procedure: DILATATION & CURETTAGE/HYSTEROSCOPY WITH ATTEMPTED HYDROTHERMAL ABLATION: Change over to [place] @ 1607;  Surgeon: Osborne Oman, MD;  Location: [facility];  Service: Gynecology;  Laterality: N/A;  . LAPAROSCOPIC ASSISTED VAGINAL HYSTERECTOMY N/A 01/22/2013   Procedure: LAPAROSCOPIC ASSISTED VAGINAL  HYSTERECTOMY with Bilateral Fallopian Tubes and Ovaries;  Surgeon:  Osborne Oman, MD;  Location: [facility];  Service: Gynecology;  Laterality: N/A;  . ROBOTIC ASSISTED TOTAL HYSTERECTOMY WITH BILATERAL SALPINGO OOPHERECTOMY Bilateral 03/31/2013   Procedure: ROBOTIC ASSISTED BILATERAL OOPHORECTOMY PELVIC AND POSSIBLE PARA-AORTIC LYMPH NODE DISSECTION;  Surgeon: Imagene Gurney A. Alycia Rossetti, MD;  Location: [facility];  Service: Gynecology;  Laterality: Bilateral;  . TUBAL LIGATION      Past Medical Hx:  Past Medical History:  Diagnosis Date  . Anemia   . Anxiety   . Cancer [facility])    ENDOMETRIAL CANCER (tx completed 05/2013)  . Complication of anesthesia    PT STATES SHE WOKE UP AFTER D&C [place].  STATES NO PROBLEMS WAKING UP AFTER HER HYSTERECTOMY  . Depression   . Headache(784.0)    MIGRAINES  . History of radiation therapy 04/23/13, 04/29/13, 05/07/13, 05/14/13, 05/21/13   30 Gy to proximal vagina  . Menorrhagia   . Migraines   . Neuropathy [facility])     Past Gynecological History: Gravida 2 per 2 menarche at age 65 with regular menses until 6 months ago. Reports oral contraceptive pill use for 14 years in the remote past Patient's last menstrual period was 11/12/2012. Denies h/o abnormal pap prior to hysterectomy.  Family Hx:  Family History  Problem Relation Age of Onset  . Diabetes Mother   . Hypertension Mother   . Cancer Neg Hx     Vitals:  BP 139/76 (BP Location: Left Arm, Patient Position: Sitting)   Pulse 72   Temp 98 F (36.7 C)   Resp 20   Wt 279 lb 14.4 oz (127 kg)   LMP 11/12/2012   BMI 43.84 kg/m    Wt Readings from Last 3 Encounters:  05/22/16 279 lb 14.4 oz (127 kg)  12/01/15 291 lb 14.4 oz (132.4 kg)  10/04/15 288 lb (130.6 kg)  There is no height or weight on file to calculate BMI.  Physical Exam: WD in an excellent mood Chest: Clear to auscultation LN:  No cervical supraclavicular or inguinal adenopathy Abdomen: Obese.   Bulge at the periumbilical incision with cough measuring approximately 4 cm reducible and  mildly tender. No nodules appreciated at other port sites, normal bowel sounds Back:  No CVAT Pelvic:  Nl EGBUS no masses bleeding or discharge.  Atropic vagina no cul-de-sac masses  Rectal: Deferred secondary to patient apprehension Psych:  Good mood and affect. EXT.  2+ lower extremity edema bilaterally

## 2016-05-22 ENCOUNTER — Ambulatory Visit: Payer: Self-pay | Attending: Gynecologic Oncology | Admitting: Gynecologic Oncology

## 2016-05-22 ENCOUNTER — Encounter: Payer: Self-pay | Admitting: Gynecologic Oncology

## 2016-05-22 VITALS — BP 139/76 | HR 72 | Temp 98.0°F | Resp 20 | Wt 279.9 lb

## 2016-05-22 DIAGNOSIS — Z833 Family history of diabetes mellitus: Secondary | ICD-10-CM | POA: Insufficient documentation

## 2016-05-22 DIAGNOSIS — Z90710 Acquired absence of both cervix and uterus: Secondary | ICD-10-CM | POA: Insufficient documentation

## 2016-05-22 DIAGNOSIS — F419 Anxiety disorder, unspecified: Secondary | ICD-10-CM | POA: Insufficient documentation

## 2016-05-22 DIAGNOSIS — K469 Unspecified abdominal hernia without obstruction or gangrene: Secondary | ICD-10-CM

## 2016-05-22 DIAGNOSIS — Z87891 Personal history of nicotine dependence: Secondary | ICD-10-CM | POA: Insufficient documentation

## 2016-05-22 DIAGNOSIS — C541 Malignant neoplasm of endometrium: Secondary | ICD-10-CM | POA: Insufficient documentation

## 2016-05-22 DIAGNOSIS — K429 Umbilical hernia without obstruction or gangrene: Secondary | ICD-10-CM | POA: Insufficient documentation

## 2016-05-22 DIAGNOSIS — Z90722 Acquired absence of ovaries, bilateral: Secondary | ICD-10-CM | POA: Insufficient documentation

## 2016-05-22 DIAGNOSIS — Z923 Personal history of irradiation: Secondary | ICD-10-CM | POA: Insufficient documentation

## 2016-05-22 DIAGNOSIS — F329 Major depressive disorder, single episode, unspecified: Secondary | ICD-10-CM | POA: Insufficient documentation

## 2016-05-22 DIAGNOSIS — Z8249 Family history of ischemic heart disease and other diseases of the circulatory system: Secondary | ICD-10-CM | POA: Insufficient documentation

## 2016-07-14 ENCOUNTER — Emergency Department (HOSPITAL_COMMUNITY): Payer: Self-pay

## 2016-07-14 ENCOUNTER — Emergency Department (HOSPITAL_COMMUNITY)
Admission: EM | Admit: 2016-07-14 | Discharge: 2016-07-14 | Disposition: A | Discharge status: Home / Self Care | Payer: Self-pay | Attending: Emergency Medicine | Admitting: Emergency Medicine

## 2016-07-14 ENCOUNTER — Encounter (HOSPITAL_COMMUNITY): Payer: Self-pay | Admitting: Emergency Medicine

## 2016-07-14 DIAGNOSIS — G43009 Migraine without aura, not intractable, without status migrainosus: Secondary | ICD-10-CM | POA: Insufficient documentation

## 2016-07-14 DIAGNOSIS — Z87891 Personal history of nicotine dependence: Secondary | ICD-10-CM | POA: Insufficient documentation

## 2016-07-14 DIAGNOSIS — Z79899 Other long term (current) drug therapy: Secondary | ICD-10-CM | POA: Insufficient documentation

## 2016-07-14 DIAGNOSIS — E860 Dehydration: Secondary | ICD-10-CM | POA: Insufficient documentation

## 2016-07-14 DIAGNOSIS — Z8542 Personal history of malignant neoplasm of other parts of uterus: Secondary | ICD-10-CM | POA: Insufficient documentation

## 2016-07-14 LAB — COMPREHENSIVE METABOLIC PANEL
ALBUMIN: 4 g/dL (ref 3.5–5.0)
ALT: 48 U/L (ref 14–54)
ANION GAP: 10 (ref 5–15)
AST: 43 U/L — ABNORMAL HIGH (ref 15–41)
Alkaline Phosphatase: 97 U/L (ref 38–126)
BILIRUBIN TOTAL: 0.4 mg/dL (ref 0.3–1.2)
BUN: 11 mg/dL (ref 6–20)
CO2: 26 mmol/L (ref 22–32)
Calcium: 9.5 mg/dL (ref 8.9–10.3)
Chloride: 101 mmol/L (ref 101–111)
Creatinine, Ser: 0.66 mg/dL (ref 0.44–1.00)
GFR calc Af Amer: >60 mL/min (ref >60)
GFR calc non Af Amer: >60 mL/min (ref >60)
GLUCOSE: 111 mg/dL — AB (ref 65–99)
POTASSIUM: 4 mmol/L (ref 3.5–5.1)
SODIUM: 137 mmol/L (ref 135–145)
TOTAL PROTEIN: 9.1 g/dL — AB (ref 6.5–8.1)

## 2016-07-14 LAB — CBC WITH DIFFERENTIAL/PLATELET
BASOS ABS: 0.1 10*3/uL (ref 0.0–0.1)
Basophils Relative: 1 %
Eosinophils Absolute: 0.2 10*3/uL (ref 0.0–0.7)
Eosinophils Relative: 2 %
HEMATOCRIT: 46.7 % — AB (ref 36.0–46.0)
Hemoglobin: 16.1 g/dL — ABNORMAL HIGH (ref 12.0–15.0)
LYMPHS PCT: 39 %
Lymphs Abs: 3.9 10*3/uL (ref 0.7–4.0)
MCH: 29.8 pg (ref 26.0–34.0)
MCHC: 34.5 g/dL (ref 30.0–36.0)
MCV: 86.5 fL (ref 78.0–100.0)
MONO ABS: 0.6 10*3/uL (ref 0.1–1.0)
Monocytes Relative: 6 %
NEUTROS ABS: 5.3 10*3/uL (ref 1.7–7.7)
Neutrophils Relative %: 52 %
Platelets: 410 10*3/uL — ABNORMAL HIGH (ref 150–400)
RBC: 5.4 MIL/uL — AB (ref 3.87–5.11)
RDW: 13.5 % (ref 11.5–15.5)
WBC: 10.1 10*3/uL (ref 4.0–10.5)

## 2016-07-14 LAB — URINALYSIS, ROUTINE W REFLEX MICROSCOPIC
Bilirubin Urine: NEGATIVE
GLUCOSE, UA: NEGATIVE mg/dL
Hgb urine dipstick: NEGATIVE
Ketones, ur: NEGATIVE mg/dL
LEUKOCYTES UA: NEGATIVE
Nitrite: NEGATIVE
PH: 9 — AB (ref 5.0–8.0)
Protein, ur: NEGATIVE mg/dL
SPECIFIC GRAVITY, URINE: 1.011 (ref 1.005–1.030)

## 2016-07-14 LAB — POC URINE PREG, ED: Preg Test, Ur: NEGATIVE

## 2016-07-14 MED ORDER — DIPHENHYDRAMINE HCL 50 MG/ML IJ SOLN
25.0000 mg | Freq: Once | INTRAMUSCULAR | Status: AC
  Administered 2016-07-14: 25 mg via INTRAVENOUS
  Filled 2016-07-14: qty 1

## 2016-07-14 MED ORDER — KETOROLAC TROMETHAMINE 30 MG/ML IJ SOLN
15.0000 mg | Freq: Once | INTRAMUSCULAR | Status: AC
  Administered 2016-07-14: 15 mg via INTRAVENOUS
  Filled 2016-07-14: qty 1

## 2016-07-14 MED ORDER — SODIUM CHLORIDE 0.9 % IV BOLUS (SEPSIS)
1000.0000 mL | Freq: Once | INTRAVENOUS | Status: AC
  Administered 2016-07-14: 1000 mL via INTRAVENOUS

## 2016-07-14 MED ORDER — PROCHLORPERAZINE EDISYLATE 5 MG/ML IJ SOLN
10.0000 mg | Freq: Once | INTRAMUSCULAR | Status: AC
  Administered 2016-07-14: 10 mg via INTRAVENOUS
  Filled 2016-07-14: qty 2

## 2016-07-14 NOTE — ED Triage Notes (Signed)
Pt complaint of worsening migraine with associated n/v/d and light/sounds sensitivity onset yesterday.

## 2016-07-14 NOTE — ED Provider Notes (Signed)
[facility] DEPT Provider Note   CSN: [id number] Arrival date & time: 07/14/16  1431     History   Chief Complaint Chief Complaint  Patient presents with  . Migraine    HPI MAGUIRE KILLMER is a 46 y.o. female.  HPI 46 year old Caucasian female past medical history significant for migraines that presents to the emergency department today with complaints of a migraine. Patient states that her migraine was gradual in onset yesterday. Describes it as throbbing and feels like migraines in the past. Has been taking her Topamax without any relief. She reports associated light sensitivity, sound sensitivity, nausea and emesis. Patient also states that she developed diarrhea yesterday but denies any abdominal pain. Denies any melena or hematochezia. States that she's had several episodes of loose stool since yesterday has not tried any further symptoms. Reports mild dizziness today. Patient denies any fever, chills, lightheadedness, vision changes, chest pain, shortness of breath, abdominal pain, urinary symptoms, lower extremity paresthesias, history of blood clots. Past Medical History:  Diagnosis Date  . Anemia   . Anxiety   . Cancer [facility])    ENDOMETRIAL CANCER (tx completed 05/2013)  . Complication of anesthesia    PT STATES SHE WOKE UP AFTER D&C [place].  STATES NO PROBLEMS WAKING UP AFTER HER HYSTERECTOMY  . Depression   . Headache(784.0)    MIGRAINES  . History of radiation therapy 04/23/13, 04/29/13, 05/07/13, 05/14/13, 05/21/13   30 Gy to proximal vagina  . Menorrhagia   . Migraines   . Neuropathy     Patient Active Problem List   Diagnosis Date Noted  . 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 Medication reaction 09/29/2015  . Acute dystonia due to drugs 09/29/2015  . Severe episode of recurrent major depressive disorder, without psychotic features ([place])   . MDD (major depressive disorder), recurrent episode, severe ([place]) 03/01/2015  .  Vasomotor flushing 09/02/2014  . Vaginal candidiasis 09/02/2014  . Panic disorder 04/25/2014  . MDD (major depressive disorder), recurrent severe, without psychosis ([place]) 04/24/2014  . Obesity (BMI 30-39.9) 02/01/2014  . Uterine cancer ([place]) 03/31/2013  . History of uterine cancer 02/02/2013  . S/P laparoscopic assisted vaginal hysterectomy (LAVH) and bilateral salpingectomy (BS) on 01/22/13 01/22/2013    Past Surgical History:  Procedure Laterality Date  . CESAREAN SECTION     X2  . CHOLECYSTECTOMY    . DILITATION & CURRETTAGE/HYSTROSCOPY WITH HYDROTHERMAL ABLATION N/A 01/01/2013   Procedure: DILATATION & CURETTAGE/HYSTEROSCOPY WITH ATTEMPTED HYDROTHERMAL ABLATION: Change over to [place] @ 6967;  Surgeon: Osborne Oman, MD;  Location: [facility];  Service: Gynecology;  Laterality: N/A;  . LAPAROSCOPIC ASSISTED VAGINAL HYSTERECTOMY N/A 01/22/2013   Procedure: LAPAROSCOPIC ASSISTED VAGINAL HYSTERECTOMY with Bilateral Fallopian Tubes and Ovaries;  Surgeon: Osborne Oman, MD;  Location: [facility];  Service: Gynecology;  Laterality: N/A;  . ROBOTIC ASSISTED TOTAL HYSTERECTOMY WITH BILATERAL SALPINGO OOPHERECTOMY Bilateral 03/31/2013   Procedure: ROBOTIC ASSISTED BILATERAL OOPHORECTOMY PELVIC AND POSSIBLE PARA-AORTIC LYMPH NODE DISSECTION;  Surgeon: Imagene Gurney A. Alycia Rossetti, MD;  Location: [facility];  Service: Gynecology;  Laterality: Bilateral;  . TUBAL LIGATION      OB History    Gravida Para Term Preterm AB Living   '2 2 2     2   '$ SAB TAB Ectopic Multiple Live Births                   Home Medications    Prior to Admission medications   Medication Sig  Start Date End Date Taking? Authorizing Provider  benztropine (COGENTIN) 1 MG tablet Take 1 tablet (1 mg total) by mouth 2 (two) times daily. Patient taking differently: Take 1.5 mg by mouth 2 (two) times daily.  10/02/15  Yes Gherghe, Vella Redhead, MD  clonazePAM (KLONOPIN) 0.5 MG tablet Take 1 tablet (0.5 mg total) by mouth 3 (three)  times daily as needed (muscle spasms / twitching / anxiety). 10/02/15  Yes Gherghe, Vella Redhead, MD  DULoxetine (CYMBALTA) 60 MG capsule Take 1 capsule (60 mg total) by mouth 2 (two) times daily. For depression 03/07/15  Yes Nwoko, Herbert Pun I, NP  gabapentin (NEURONTIN) 300 MG capsule Take 300 mg by mouth 3 (three) times daily.   Yes [provider]  hydrOXYzine (ATARAX/VISTARIL) 25 MG tablet Take 1 tablet (25 mg total) by mouth every 6 (six) hours as needed for anxiety. Patient taking differently: Take 25 mg by mouth every 8 (eight) hours as needed for anxiety.  03/07/15  Yes Lindell Spar I, NP  loratadine (CLARITIN) 10 MG tablet Take 1 tablet (10 mg total) by mouth daily. For allergies 03/07/15  Yes Lindell Spar I, NP  omeprazole (PRILOSEC) 20 MG capsule Take 1 capsule (20 mg total) by mouth daily. For acid reflux 03/07/15  Yes Lindell Spar I, NP  sertraline (ZOLOFT) 25 MG tablet Take 25 mg by mouth daily.   Yes [provider]  topiramate (TOPAMAX) 25 MG tablet Take 2 tablets (50 mg total) by mouth 2 (two) times daily. For migraine headache pain 03/07/15  Yes Lindell Spar I, NP  triamcinolone cream (KENALOG) 0.1 % Apply 1 application topically 2 (two) times daily.   Yes [provider]    Family History Family History  Problem Relation Age of Onset  . Diabetes Mother   . Hypertension Mother   . Cancer Neg Hx     Social History Social History  Substance Use Topics  . Smoking status: Former Smoker    Packs/day: 0.50    Years: 26.00    Types: Cigarettes    Quit date: 08/03/2013  . Smokeless tobacco: Never Used  . Alcohol use No     Allergies   Rexulti [brexpiprazole]; Fluoxetine; and Hydrocodone-acetaminophen   Review of Systems Review of Systems  Constitutional: Negative for chills and fever.  HENT: Negative for congestion.   Eyes: Positive for photophobia. Negative for visual disturbance.  Respiratory: Negative for cough and shortness of breath.   Cardiovascular:  Negative for chest pain, palpitations and leg swelling.  Gastrointestinal: Positive for diarrhea, nausea and vomiting. Negative for abdominal pain and blood in stool.  Genitourinary: Negative for dysuria, flank pain, frequency, hematuria and urgency.  Musculoskeletal: Negative for neck pain and neck stiffness.  Skin: Negative.   Neurological: Positive for dizziness and headaches. Negative for syncope, weakness and light-headedness.     Physical Exam Updated Vital Signs BP (!) 130/58 (BP Location: Right Arm)   Pulse (!) 58   Temp 97.9 F (36.6 C) (Oral)   Resp 17   LMP 11/12/2012   SpO2 100%   Physical Exam  Constitutional: She is oriented to person, place, and time. She appears well-developed and well-nourished. No distress.  HENT:  Head: Normocephalic and atraumatic.  Mouth/Throat: Oropharynx is clear and moist.  No temporal tenderness  Eyes: Conjunctivae and EOM are normal. Pupils are equal, round, and reactive to light. Right eye exhibits no discharge. Left eye exhibits no discharge. No scleral icterus.  Neck: Normal range of motion. Neck supple. No  thyromegaly present.  No nuchal rigidity  Cardiovascular: Normal rate, regular rhythm, normal heart sounds and intact distal pulses.  Exam reveals no gallop and no friction rub.   No murmur heard. Hr 58-60 with history of same.  Pulmonary/Chest: Effort normal and breath sounds normal. No respiratory distress. She has no wheezes.  Abdominal: Soft. Bowel sounds are normal. She exhibits no distension. There is no tenderness. There is no rebound and no guarding.  Musculoskeletal: Normal range of motion.  No lower extremity edema.  Lymphadenopathy:    She has no cervical adenopathy.  Neurological: She is alert and oriented to person, place, and time.  The patient is alert, attentive, and oriented x 3. Speech is clear. Cranial nerve II-VII grossly intact. Negative pronator drift. Sensation intact. Strength 5/5 in all extremities.  Reflexes 2+ and symmetric at biceps, triceps, knees, and ankles. Rapid alternating movement and fine finger movements intact. Romberg is absent. Posture and gait normal.   Skin: Skin is warm and dry. Capillary refill takes less than 2 seconds.  Nursing note and vitals reviewed.    ED Treatments / Results  Labs (all labs ordered are listed, but only abnormal results are displayed) Labs Reviewed  COMPREHENSIVE METABOLIC PANEL - Abnormal; Notable for the following:       Result Value   Glucose, Bld 111 (*)    Total Protein 9.1 (*)    AST 43 (*)    All other components within normal limits  CBC WITH DIFFERENTIAL/PLATELET - Abnormal; Notable for the following:    RBC 5.40 (*)    Hemoglobin 16.1 (*)    HCT 46.7 (*)    Platelets 410 (*)    All other components within normal limits  URINALYSIS, ROUTINE W REFLEX MICROSCOPIC - Abnormal; Notable for the following:    APPearance HAZY (*)    pH 9.0 (*)    All other components within normal limits  POC URINE PREG, ED    EKG  EKG Interpretation None       Radiology Ct Head Wo Contrast  Result Date: 07/14/2016 CLINICAL DATA:  Headache EXAM: CT HEAD WITHOUT CONTRAST TECHNIQUE: Contiguous axial images were obtained from the base of the skull through the vertex without intravenous contrast. COMPARISON:  None. FINDINGS: Brain: No evidence of acute infarction, hemorrhage, hydrocephalus, extra-axial collection or mass lesion/mass effect. Vascular: Negative for hyperdense MCA Skull: Negative Sinuses/Orbits: Negative Other: None IMPRESSION: Negative CT head Electronically Signed   By: Franchot Gallo M.D.   On: 07/14/2016 16:21    Procedures Procedures (including critical care time)  Medications Ordered in ED Medications  ketorolac (TORADOL) 30 MG/ML injection 15 mg (15 mg Intravenous Given 07/14/16 1603)  sodium chloride 0.9 % bolus 1,000 mL (0 mLs Intravenous Stopped 07/14/16 1749)  prochlorperazine (COMPAZINE) injection 10 mg (10 mg  Intravenous Given 07/14/16 1601)  diphenhydrAMINE (BENADRYL) injection 25 mg (25 mg Intravenous Given 07/14/16 1602)  sodium chloride 0.9 % bolus 1,000 mL (0 mLs Intravenous Stopped 07/14/16 1858)     Initial Impression / Assessment and Plan / ED Course  I have reviewed the triage vital signs and the nursing notes.  Pertinent labs & imaging results that were available during my care of the patient were reviewed by me and considered in my medical decision making (see chart for details).     Patient was asked to the ED with complaints of gradual onset worsening migraine that is unrelieved by her home medications. History of migraines and states this feels similar. Reports  associated nausea, emesis, photophobia, sound sensitivity. Patient also reports emesis and diarrhea that started yesterday. Abdominal exam is benign. Denies any blood in her stools. Vital signs are stable. Does have mild bradycardia noted but history of same and patient seems to be some asymptomatic with this. Denies any chest pain or shortness of breath. No leukocytosis noted. Patient seems to be mildly hemoconcentrated likely due to dehydration along with concentrated urine.  Patient given a liter of fluid and migraine cocktail. Pt HA treated and improved while in ED.  Presentation is like pts typical HA and non concerning for [facility], ICH, Meningitis, or temporal arteritis. Pt is afebrile with no focal neuro deficits, nuchal rigidity, or change in vision. Patient was reassessed and feels significantly improved. Migraine has resolved. Denies any dizziness. Able to ambulate without any dizziness. Review be discharged and is hungry. Able tolerate by mouth fluids without any difficulties. Encouraged clear liquid diet. Patient's symptoms likely due to GI illness. Encouraged plenty of fluids at home and given strict return precautions. Non toxic appearing. Pt is hemodynamically stable, in NAD, & able to ambulate in the ED. Pain has been managed &  has no complaints prior to dc. Pt is comfortable with above plan and is stable for discharge at this time. All questions were answered prior to disposition. Strict return precautions for f/u to the ED were discussed.    Final Clinical Impressions(s) / ED Diagnoses   Final diagnoses:  Dehydration  Migraine without aura and without status migrainosus, not intractable    New Prescriptions New Prescriptions   No medications on file     Aaron Edelman 07/14/16 1918    Malvin Johns, MD 07/15/16 [phone]

## 2016-07-14 NOTE — Discharge Instructions (Signed)
Please patient take your medications as prescribed at home. Follow with her primary care doctor return to the ED via worsening symptoms.

## 2016-09-02 HISTORY — PX: HERNIA REPAIR: SHX51

## 2016-11-22 IMAGING — RF DG UGI W/ HIGH DENSITY W/KUB
18 of 20 series · 18 of 20 positions shown · non-contrast
Comparison: CT abdomen pelvis dated 12/22/2014

CLINICAL DATA: Dyspepsia

EXAM:
UPPER GI SERIES WITH KUB
TECHNIQUE: After obtaining a scout radiograph a routine upper GI series was
performed using thin and high density barium.
FLUOROSCOPY TIME:  Radiation Exposure Index (as provided by the
fluoroscopic device): 131 d Gy cm2

[Series 1: run · 1 of 1 slices shown (1 of 17)]
[im 1/1]
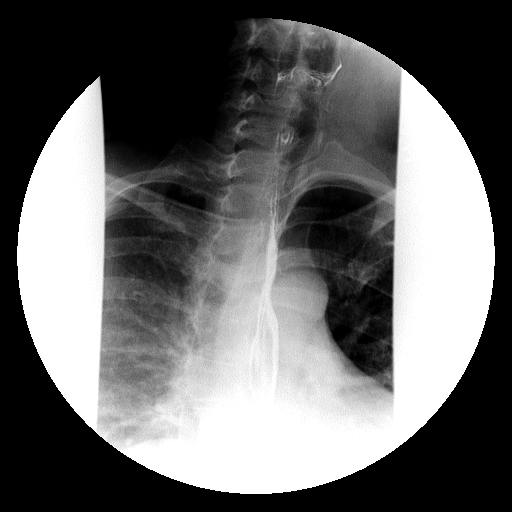

[Series 2: run · 1 of 1 slices shown (2 of 17)]
[im 1/1]
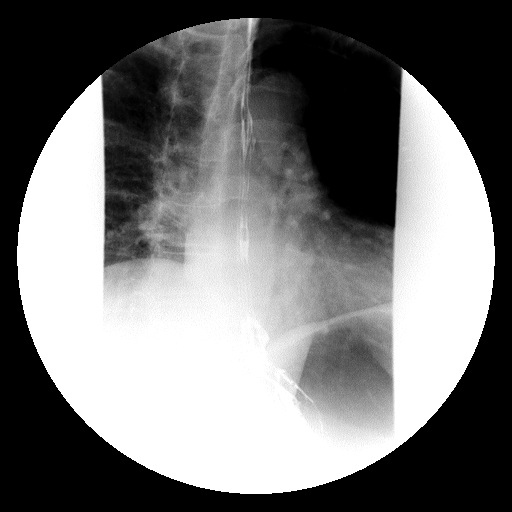

[Series 3: run · 1 of 1 slices shown (3 of 17)]
[im 1/1]
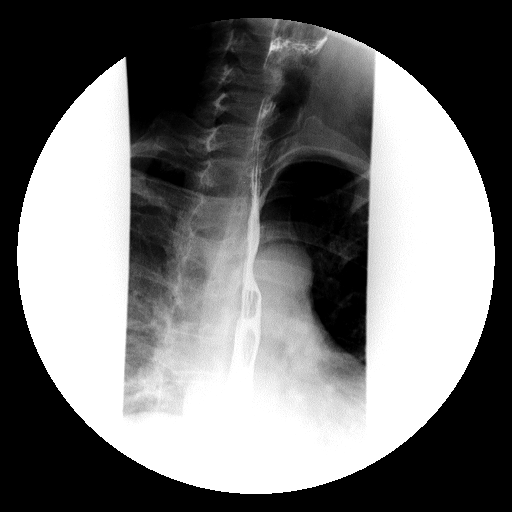

[Series 4: run · 1 of 1 slices shown (4 of 17)]
[im 1/1]
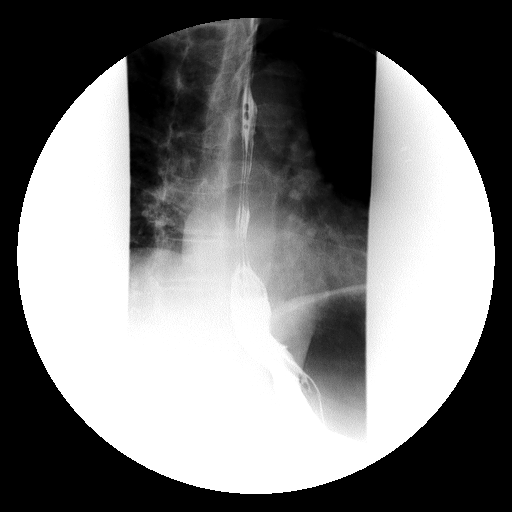

[Series 6: run · 1 of 1 slices shown (5 of 17)]
[im 1/1]
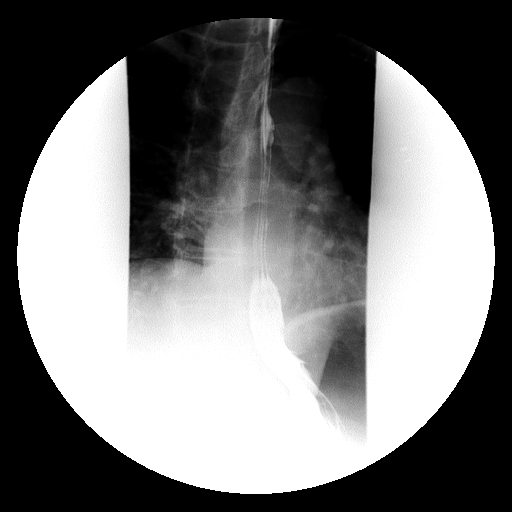

[Series 7: run · 1 of 1 slices shown (6 of 17)]
[im 1/1]
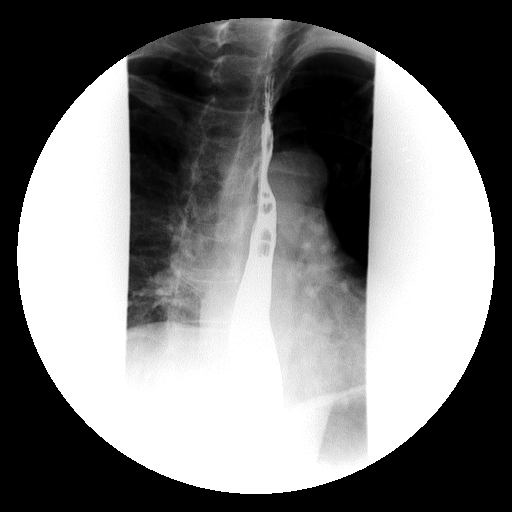

[Series 8: run · 1 of 1 slices shown (7 of 17)]
[im 1/1]
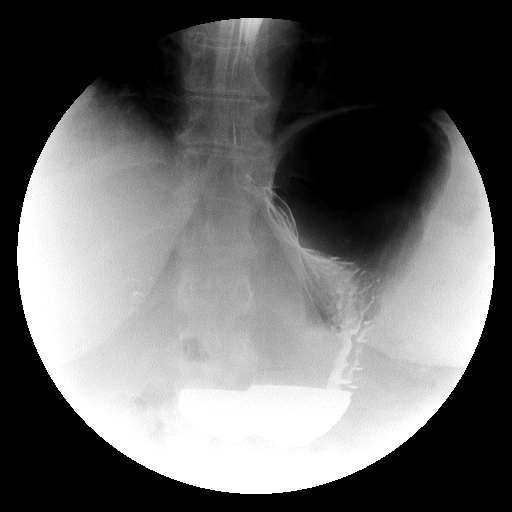

[Series 9: run · 1 of 1 slices shown (8 of 17)]
[im 1/1]
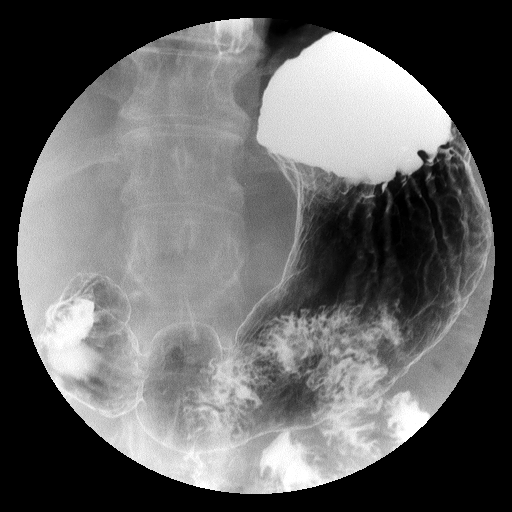

[Series 10: run · 1 of 1 slices shown (9 of 17)]
[im 1/1]
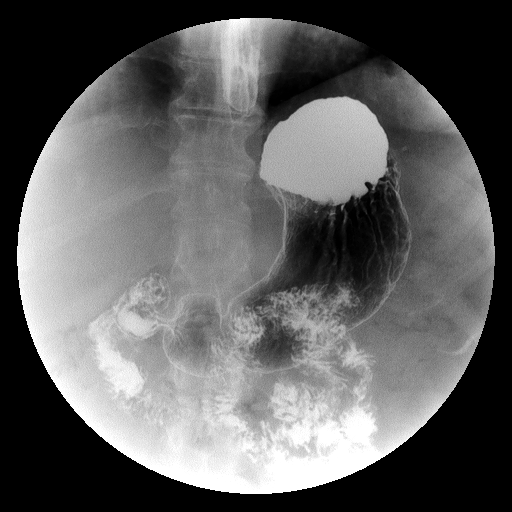

[Series 11: run · 1 of 1 slices shown (10 of 17)]
[im 1/1]
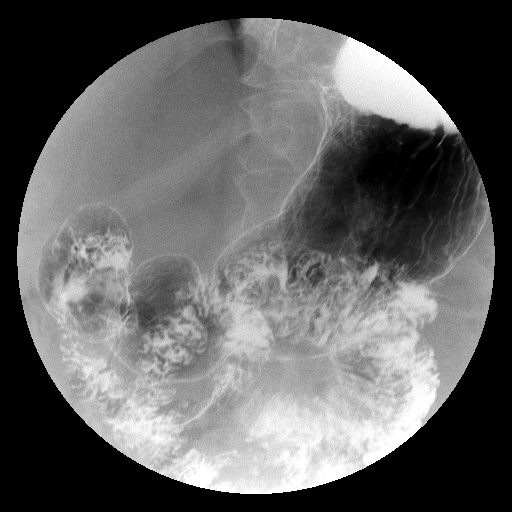

[Series 12: run · 1 of 1 slices shown (11 of 17)]
[im 1/1]
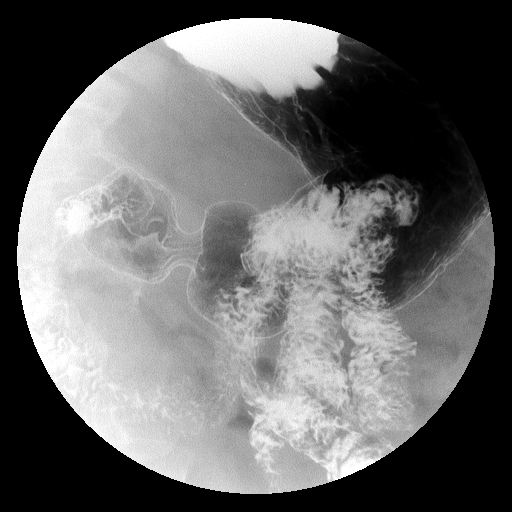

[Series 13: run · 1 of 1 slices shown (12 of 17)]
[im 1/1]
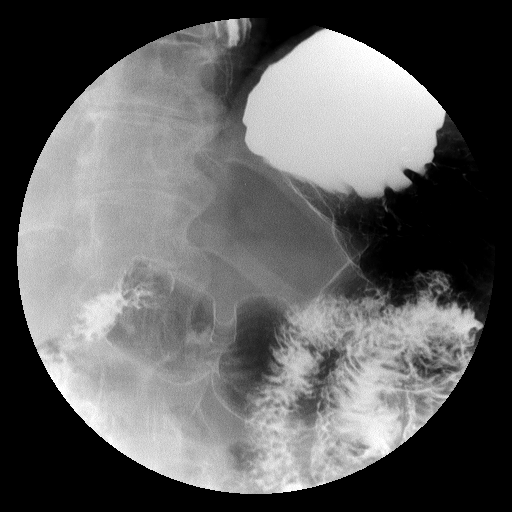

[Series 14: run · 1 of 1 slices shown (13 of 17)]
[im 1/1]
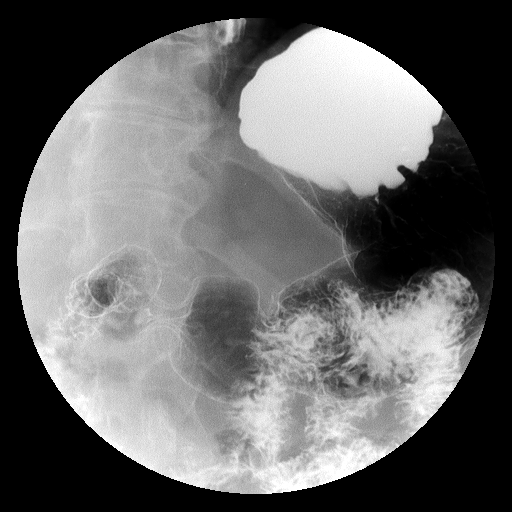

[Series 16: run · 1 of 1 slices shown (14 of 17)]
[im 1/1]
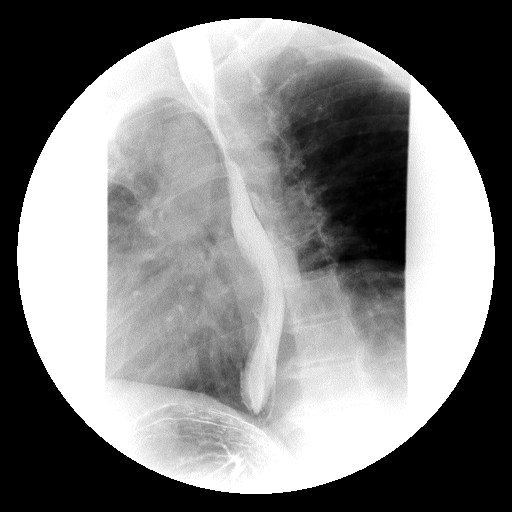

[Series 17: run · 1 of 1 slices shown (15 of 17)]
[im 1/1]
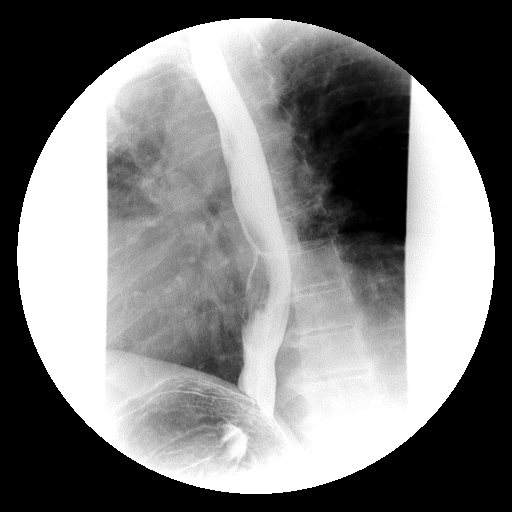

[Series 18: run · 1 of 1 slices shown (16 of 17)]
[im 1/1]
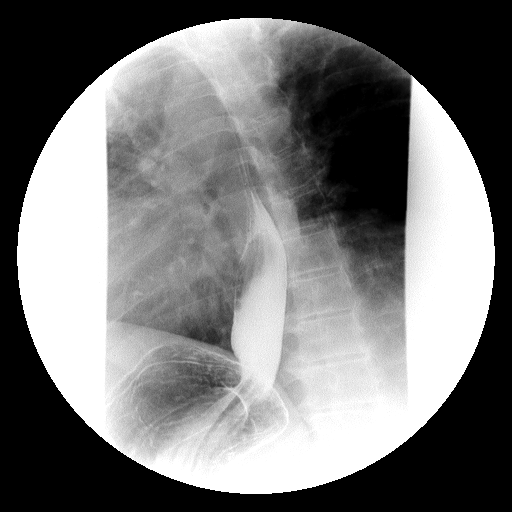

[Series 19: run · 1 of 1 slices shown (17 of 17)]
[im 1/1]
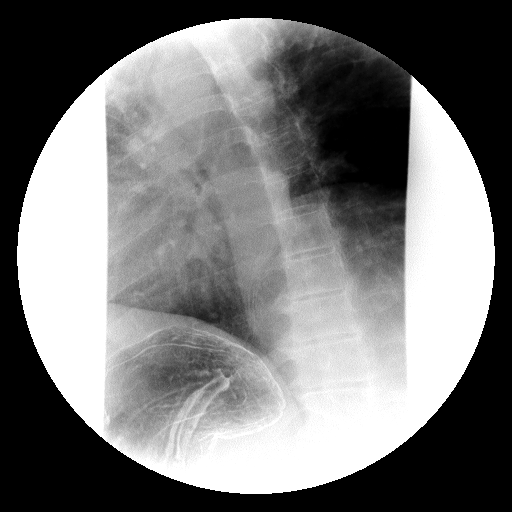

[Series 1001: view not recorded · 0.20mm/px · 1 of 1 slices shown]
[im 1/1]
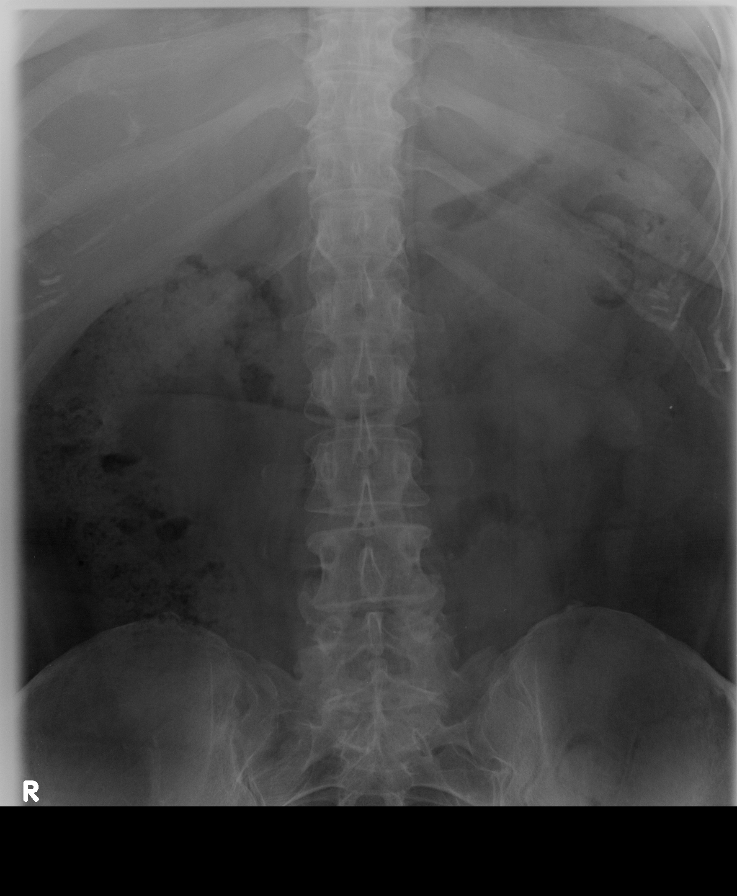

[18 of 20 positions shown; findings below may reference images not displayed]

FINDINGS: Scout radiographs demonstrate a nonobstructive bowel gas pattern.

Normal esophageal peristalsis. No fixed esophageal narrowing or
stricture.

No evidence of hiatal hernia.  Mild gastroesophageal reflux.

Normal gastric folds.

Proximal duodenum is within normal limits.
IMPRESSION: Mild gastroesophageal reflux.

## 2017-03-01 ENCOUNTER — Telehealth: Payer: Self-pay | Admitting: *Deleted

## 2017-03-01 NOTE — Telephone Encounter (Signed)
Patient called and scheduled an appt for January 31st

## 2017-03-19 ENCOUNTER — Encounter (HOSPITAL_COMMUNITY): Payer: Self-pay

## 2017-03-25 ENCOUNTER — Telehealth: Payer: Self-pay | Admitting: *Deleted

## 2017-03-25 NOTE — Telephone Encounter (Signed)
Called and left the patient a message to call the office back. Need to let the patient know Dr. Skeet Latch will be out of the office on January 31st, Dr. Denman George will be covering.

## 2017-03-27 ENCOUNTER — Telehealth: Payer: Self-pay | Admitting: *Deleted

## 2017-03-27 NOTE — Telephone Encounter (Signed)
Called and left a message for the patient on 1/22 and 1/23 to call the office back.

## 2017-03-28 ENCOUNTER — Telehealth: Payer: Self-pay | Admitting: *Deleted

## 2017-03-28 NOTE — Telephone Encounter (Signed)
Patient called and was informed that Dr. Skeet Latch will not be here on January 31st, Dr. Denman George will see her

## 2017-04-04 ENCOUNTER — Encounter: Payer: Self-pay | Admitting: Gynecologic Oncology

## 2017-04-04 ENCOUNTER — Inpatient Hospital Stay: Payer: Self-pay | Attending: Gynecologic Oncology | Admitting: Gynecologic Oncology

## 2017-04-04 VITALS — BP 146/68 | HR 66 | Temp 98.1°F | Resp 18 | Ht 67.0 in | Wt 261.5 lb

## 2017-04-04 DIAGNOSIS — C541 Malignant neoplasm of endometrium: Secondary | ICD-10-CM

## 2017-04-04 DIAGNOSIS — Z90722 Acquired absence of ovaries, bilateral: Secondary | ICD-10-CM | POA: Insufficient documentation

## 2017-04-04 DIAGNOSIS — Z923 Personal history of irradiation: Secondary | ICD-10-CM | POA: Insufficient documentation

## 2017-04-04 DIAGNOSIS — Z87891 Personal history of nicotine dependence: Secondary | ICD-10-CM | POA: Insufficient documentation

## 2017-04-04 DIAGNOSIS — M25559 Pain in unspecified hip: Secondary | ICD-10-CM | POA: Insufficient documentation

## 2017-04-04 DIAGNOSIS — R109 Unspecified abdominal pain: Secondary | ICD-10-CM | POA: Insufficient documentation

## 2017-04-04 DIAGNOSIS — Z90710 Acquired absence of both cervix and uterus: Secondary | ICD-10-CM | POA: Insufficient documentation

## 2017-04-04 NOTE — Patient Instructions (Signed)
Please notify Dr Skeet Latch at phone number [phone] if you notice vaginal bleeding, new pelvic or abdominal pains, bloating, feeling full easy, or a change in bladder or bowel function.   Please follow-up with Dr Skeet Latch in 6 months for follow-up.  A CT scan has been scheduled for you to evaluate your abdominal and pelvic pains. Dr Leone Brand office will contact you with the results.

## 2017-04-04 NOTE — Progress Notes (Signed)
GYN ONCOLOGY OFFICE VISIT   CC:  Endometrial cancer surveillance,   Assessment/Plan:  [name]  is a 47 y.o.  year old status post laparoscopic hysterectomy bilateral salpingectomy by Dr. Harolyn Mathis  for abnormal uterine bleeding with 2 prior endometrial sampling that were negative for malignancy. The specimen was morcellated into 4 pieces to facilitate removal from the vagina.  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 Based on the grade and lymphovascular space involvement is recommended that she undergo vaginal cuff brachytherapy.Treatment was completed May 21, 2013.  Abnormal pap 12/2013 LSIL HPV+ colpo neg Pap 05/2014 ASCUS + hrHPV  Pap 12/2014 neg hrHPV neg Pap 11/2015 wnl   Follow-up with Dr. Skeet Mathis 09/2017 - pap (or HPV testing) at that time for surveillance of vaginal dysplasia.  Unexplained abdominal pains. CT abd/pelvis ordered to rule out recurrence.   HPI: [name]  is a 47 y.o. Marland KitchenG2 P2 last normal menstrual period in 11/03/2012. She reports abnormal uterine bleeding that began approximately 6 months ago. It became very heavy in September 2014 when she presented for evaluation and was initially placed on Megace.  12/01/12 ECC - BENIGN ENDOCERVICAL MUCOSA, - DETACHED FRAGMENTS OF SQUAMOUS EPITHELIUM; NEGATIVE FOR INTRAEPITHELIAL LESION OR MALIGANNCY. Endometrium, biopsy - FRAGMENTS OF ENDOMETRIOID-TYPE POLYP(S) WITH DECIDUALIZATION OF THE STROMA, CONSISTENT WITH HORMONE EFFECT. - THERE IS NO EVIDENCE OF HYPERPLASIA OR MALIGNANCY. Repeat biopsy on 01/02/2013 Endometrium, curettage - FRAGMENTS SUGGESTIVE OF ENDOMETRIOID TYPE POLYP(S) WITH DECIDUALIZATION OF THE STROMA, CONSISTENT WITH HORMONE EFFECT. - BENIGN ENDOCERVICAL  TYPE MUCOSA. - THERE IS NO EVIDENCE OF HYPERPLASIA OR MALIGNANCY.  On 01/22/2013 she underwent a total laparoscopic hysterectomy unilateral salpingectomy with "some  vaginal morcellation to deliver the uterus vaginally after the hysterectomy was completed"  Uterus and bilateral fallopian tubes, with cervix ( 4 sections) Specimen: Uterus, cervix, one fallopian tube Procedure: Hysterectomy and salpingectomy Lymph node sampling performed: No Specimen integrity: Fragmented, please see gross description for details. Maximum tumor size: Estimated size 6.5 cm, grossly Histologic type: Invasive endometrioid carcinoma Grade: FIGO Grade II Myometrial invasion: 0.9 cm where myometrium is 2.4 cm in thickness Cervical stromal involvement: No Lymph - vascular invasion: Present, extensively FIGO Stage (based on pathologic findings, needs clinical correlation): At least IA Comment:  Sections show an invasive endometrioid carcinoma involving the upper portion of the uterus, estimated 6.5 cm in greatest dimension. There is extensive angiolymphatic invasion present. IHC Expression Result: MLH1: Preserved nuclear expression (greater 50% tumor expression) MSH2: Preserved nuclear expression (greater 50% tumor expression) MSH6: Preserved nuclear expression (greater 50% tumor expression) PMS2: Preserved nuclear expression (greater 50% tumor expression) * Internal control demonstrates intact nuclear expression  Discussion with Dr. Harolyn Mathis confirmed that there was vaginal not intra-abdominal morcellation   CT 12/17/2015to assess for nodal or intraperioneal disease notable for no retroperitoneal or retrocrural adenopathy. Normal colon, appendix, and terminal ileum. Normal small bowel without abdominal ascites. No evidence of omental  or peritoneal disease. No pelvic adenopathy. Residual normal appearing left ovary on image 72 and right ovary on image 76. No adnexal mass. There is mild pelvic fascia thickening and  trace fluid which is presumably postoperative.   On 03/31/13 she underwent: Robotic bilateral oophorectomy, left salpingectomy, bilateral pelvic lymph node dissection, right PA node sampling   Pathology: Bilateral ovaries, left fallopian tube, Right  para-aortic lymph nodes, bilateral pelvic lymph nodes to pathology.   Operative findings: Status post hysterectomy with mild adhesive disease of the rectosigmoid colon to the left adnexa. Significant intra-abdominal obesity. Prominent bilateral pelvic lymph nodes.  Pathology revealed: Diagnosis 1. Ovary and fallopian tube, left - BENIGN OVARY WITH CYSTIC FOLLICLES. - SURFACE FOREIGN BODY GIANT CELL REACTION. - THERE IS NO EVIDENCE OF MALIGNANCY. - SEE COMMENT. 2. Ovary and fallopian tube, right - BENIGN OVARY WITH CYSTIC FOLLICLES. - THERE IS NO EVIDENCE OF MALIGNANCY. - SEE COMMENT. 3. Lymph nodes, regional resection, right pelvic - THERE IS NO EVIDENCE OF CARCINOMA IN 10 OF 10 LYMPH NODES (0/10). 4. Lymph nodes, regional resection, left pelvic - THERE IS NO EVIDENCE OF CARCINOMA IN 7 OF 7 LYMPH NODES (0/7). 5. Lymph nodes, regional resection, right para-aortic - THERE IS NO EVIDENCE OF CARCINOMA IN 1 OF 1 LYMPH NODE (0/1).  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   Given extensive LVSI adjuvant bracytherapy was recommended. Radiation treatment dates: February 19th, February 25, March 5, March 12, May 21, 2013  Site/dose: Proximal vagina 30 Gy in 5 fractions  Beams/energy: Patient was treated with intracavitary brachytherapy treatments alone using iridium 192 as the high-dose-rate source. The patient was treated with a 3 cm diameter cylinder. Prescription was to the mucosal surface. Treatment length was 4 cm  Pap 12/27/2013 LSIL +hrHPV DNA Colposcopy 01/2014 Vagina, biopsy - BENIGN SQUAMOUS MUCOSA. - NO DYSPLASIA OR EVIDENCE OF MALIGNANCY. Repeat pap 05/2014 ASCUCs  HPV+ Abnormal pap 12/2013 LSIL HPV+ colpo neg  Pap 11/2015 wnl   Interval Hx: Reported umbilical pain at visit 06/9177 CT A/P 12/2015 IMPRESSION: No acute process within the abdomen or pelvis.  There is a small bowel containing periumbilical hernia without evidence for obstruction.  Extensive hepatic steatosis.  ROS:  No cough, Left flank pains for the past few weeks, worse at night when she lies on her side - takes her breath away. New onset deep dyspareunia x 1 month. No vaginal bleeding no hematochezia or hematuria no changes in bowel or bladder habits, no adenopathy.  No fever or chills,  Otherwise 10 point review of systems negative.  S/p umbilical hernia repair with resection of umbilicus.   Social Hx:   Social History   Socioeconomic History  . Marital status: Divorced    Spouse name: Not on file  . Number of children: Not on file  . Years of education: Not on file  . Highest education level: Not on file  Social Needs  . Financial resource strain: Not on file  . Food insecurity - worry: Not on file  . Food insecurity - inability: Not on file  . Transportation needs - medical: Not on file  . Transportation needs - non-medical: Not on file  Occupational History  . Not on file  Tobacco Use  . Smoking status: Former Smoker    Packs/day: 0.50    Years: 26.00    Pack years: 13.00    Types: Cigarettes    Last attempt to quit: 08/03/2013    Years since quitting: 3.6  . Smokeless tobacco: Never Used  Substance and Sexual Activity  . Alcohol use: No    Alcohol/week: 0.0 oz  . Drug use: No  . Sexual activity: Not Currently    Birth control/protection: Surgical  Other Topics Concern  . Not on file  Social History Narrative  . Not on file  Stopped  smoking.   Past Surgical Hx:  Past Surgical History:  Procedure Laterality Date  . CESAREAN SECTION     X2  . CHOLECYSTECTOMY    . DILITATION & CURRETTAGE/HYSTROSCOPY WITH HYDROTHERMAL ABLATION N/A 01/01/2013    Procedure: DILATATION & CURETTAGE/HYSTEROSCOPY WITH ATTEMPTED HYDROTHERMAL ABLATION: Change over to [place] @ 1173;  Surgeon: Osborne Oman, MD;  Location: [facility];  Service: Gynecology;  Laterality: N/A;  . HERNIA REPAIR  09/2016  . LAPAROSCOPIC ASSISTED VAGINAL HYSTERECTOMY N/A 01/22/2013   Procedure: LAPAROSCOPIC ASSISTED VAGINAL HYSTERECTOMY with Bilateral Fallopian Tubes and Ovaries;  Surgeon: Osborne Oman, MD;  Location: [facility];  Service: Gynecology;  Laterality: N/A;  . ROBOTIC ASSISTED TOTAL HYSTERECTOMY WITH BILATERAL SALPINGO OOPHERECTOMY Bilateral 03/31/2013   Procedure: ROBOTIC ASSISTED BILATERAL OOPHORECTOMY PELVIC AND POSSIBLE PARA-AORTIC LYMPH NODE DISSECTION;  Surgeon: Imagene Gurney A. Alycia Rossetti, MD;  Location: [facility];  Service: Gynecology;  Laterality: Bilateral;  . TUBAL LIGATION      Past Medical Hx:  Past Medical History:  Diagnosis Date  . Anemia   . Anxiety   . Cancer [facility])    ENDOMETRIAL CANCER (tx completed 05/2013)  . Complication of anesthesia    PT STATES SHE WOKE UP AFTER D&C [place].  STATES NO PROBLEMS WAKING UP AFTER HER HYSTERECTOMY  . Depression   . Headache(784.0)    MIGRAINES  . History of radiation therapy 04/23/13, 04/29/13, 05/07/13, 05/14/13, 05/21/13   30 Gy to proximal vagina  . Menorrhagia   . Migraines   . Neuropathy     Past Gynecological History: Gravida 2 per 2 menarche at age 55 with regular menses until 6 months ago. Reports oral contraceptive pill use for 14 years in the remote past Patient's last menstrual period was 11/12/2012. Denies h/o abnormal pap prior to hysterectomy.  Family Hx:  Family History  Problem Relation Age of Onset  . Diabetes Mother   . Hypertension Mother   . Cancer Neg Hx     Vitals:  BP (!) 146/68 (BP Location: Right Arm, Patient Position: Sitting)   Pulse 66   Temp 98.1 F (36.7 C) (Oral)   Resp 18   Ht '5\' 7"'  (1.702 m)   Wt 261 lb 8 oz (118.6 kg)   LMP 11/12/2012   SpO2  99%   BMI 40.96 kg/m    Wt Readings from Last 3 Encounters:  04/04/17 261 lb 8 oz (118.6 kg)  05/22/16 279 lb 14.4 oz (127 kg)  12/01/15 291 lb 14.4 oz (132.4 kg)  Body mass index is 40.96 kg/m.  Physical Exam: WD in an excellent mood Chest: Clear to auscultation LN:  No cervical supraclavicular or inguinal adenopathy Abdomen: Obese.   Umbilical hernia incision well healed (surgically absent umbilicus). No nodules appreciated at other port sites, normal bowel sounds Back:  No CVAT Pelvic:  Nl EGBUS no masses bleeding or discharge.  Atropic vagina no cul-de-sac masses  Rectal: Deferred secondary to patient apprehension Psych:  Good mood and affect. EXT.  2+ lower extremity edema bilaterally  Donaciano Eva, MD

## 2017-04-08 ENCOUNTER — Telehealth: Payer: Self-pay

## 2017-04-08 ENCOUNTER — Ambulatory Visit (HOSPITAL_COMMUNITY)
Admission: RE | Admit: 2017-04-08 | Discharge: 2017-04-08 | Disposition: A | Discharge status: Home / Self Care | Payer: Self-pay | Source: Ambulatory Visit | Attending: Gynecologic Oncology | Admitting: Gynecologic Oncology

## 2017-04-08 DIAGNOSIS — C541 Malignant neoplasm of endometrium: Secondary | ICD-10-CM | POA: Insufficient documentation

## 2017-04-08 DIAGNOSIS — R109 Unspecified abdominal pain: Secondary | ICD-10-CM | POA: Insufficient documentation

## 2017-04-08 DIAGNOSIS — K760 Fatty (change of) liver, not elsewhere classified: Secondary | ICD-10-CM | POA: Insufficient documentation

## 2017-04-08 DIAGNOSIS — M25559 Pain in unspecified hip: Secondary | ICD-10-CM | POA: Insufficient documentation

## 2017-04-08 MED ORDER — IOPAMIDOL (ISOVUE-300) INJECTION 61%
100.0000 mL | Freq: Once | INTRAVENOUS | Status: AC | PRN
  Administered 2017-04-08: 100 mL via INTRAVENOUS

## 2017-04-08 MED ORDER — IOPAMIDOL (ISOVUE-300) INJECTION 61%
INTRAVENOUS | Status: AC
  Filled 2017-04-08: qty 100

## 2017-04-08 NOTE — Telephone Encounter (Signed)
Told Sheryl Mathis that her CT A/P showed no evidence of recurrent or metastatic disease. Her fatty liver is stable per [name], NP. Encourage her to establish care with a PCP to further evaluate her abdominal discomfort. Pt verbalized understanding.

## 2017-04-08 NOTE — Telephone Encounter (Signed)
LM for pt to call back to discuss the results of her CT scan done this am.

## 2017-06-08 ENCOUNTER — Encounter (HOSPITAL_COMMUNITY): Payer: Self-pay

## 2017-06-08 ENCOUNTER — Other Ambulatory Visit: Payer: Self-pay

## 2017-06-08 DIAGNOSIS — R1084 Generalized abdominal pain: Secondary | ICD-10-CM | POA: Insufficient documentation

## 2017-06-08 DIAGNOSIS — Z79899 Other long term (current) drug therapy: Secondary | ICD-10-CM | POA: Insufficient documentation

## 2017-06-08 DIAGNOSIS — Z87891 Personal history of nicotine dependence: Secondary | ICD-10-CM | POA: Insufficient documentation

## 2017-06-08 NOTE — ED Triage Notes (Signed)
States abdominal pain worse in left side since Jan-Feb 2019 saw cancer doctor and did CT scan which was negative per pt.  No hematuria no fever with nausea voiced.

## 2017-06-08 NOTE — ED Notes (Signed)
Pt is a very hard stick. Pt will wait to get IV for blood draw

## 2017-06-09 ENCOUNTER — Emergency Department (HOSPITAL_COMMUNITY)
Admission: EM | Admit: 2017-06-09 | Discharge: 2017-06-09 | Disposition: A | Discharge status: Home / Self Care | Payer: Self-pay | Attending: Emergency Medicine | Admitting: Emergency Medicine

## 2017-06-09 DIAGNOSIS — R109 Unspecified abdominal pain: Secondary | ICD-10-CM

## 2017-06-09 DIAGNOSIS — G8929 Other chronic pain: Secondary | ICD-10-CM

## 2017-06-09 LAB — COMPREHENSIVE METABOLIC PANEL
ALT: 27 U/L (ref 14–54)
ANION GAP: 8 (ref 5–15)
AST: 24 U/L (ref 15–41)
Albumin: 3.8 g/dL (ref 3.5–5.0)
Alkaline Phosphatase: 86 U/L (ref 38–126)
BILIRUBIN TOTAL: 0.3 mg/dL (ref 0.3–1.2)
BUN: 21 mg/dL — AB (ref 6–20)
CHLORIDE: 102 mmol/L (ref 101–111)
CO2: 26 mmol/L (ref 22–32)
Calcium: 9.4 mg/dL (ref 8.9–10.3)
Creatinine, Ser: 0.65 mg/dL (ref 0.44–1.00)
Glucose, Bld: 119 mg/dL — ABNORMAL HIGH (ref 65–99)
POTASSIUM: 3.8 mmol/L (ref 3.5–5.1)
Sodium: 136 mmol/L (ref 135–145)
TOTAL PROTEIN: 8.4 g/dL — AB (ref 6.5–8.1)

## 2017-06-09 LAB — URINALYSIS, ROUTINE W REFLEX MICROSCOPIC
BILIRUBIN URINE: NEGATIVE
GLUCOSE, UA: NEGATIVE mg/dL
Hgb urine dipstick: NEGATIVE
KETONES UR: 5 mg/dL — AB
LEUKOCYTES UA: NEGATIVE
NITRITE: NEGATIVE
PH: 6 (ref 5.0–8.0)
PROTEIN: NEGATIVE mg/dL
Specific Gravity, Urine: 1.011 (ref 1.005–1.030)

## 2017-06-09 LAB — CBC
HCT: 39.4 % (ref 36.0–46.0)
Hemoglobin: 13.4 g/dL (ref 12.0–15.0)
MCH: 29.7 pg (ref 26.0–34.0)
MCHC: 34 g/dL (ref 30.0–36.0)
MCV: 87.4 fL (ref 78.0–100.0)
Platelets: 349 10*3/uL (ref 150–400)
RBC: 4.51 MIL/uL (ref 3.87–5.11)
RDW: 13 % (ref 11.5–15.5)
WBC: 10.1 10*3/uL (ref 4.0–10.5)

## 2017-06-09 LAB — LIPASE, BLOOD: LIPASE: 45 U/L (ref 11–51)

## 2017-06-09 LAB — I-STAT BETA HCG BLOOD, ED (MC, WL, AP ONLY): I-stat hCG, quantitative: <5 m[IU]/mL (ref <5)

## 2017-06-09 MED ORDER — ONDANSETRON HCL 4 MG/2ML IJ SOLN
4.0000 mg | Freq: Once | INTRAMUSCULAR | Status: AC
  Administered 2017-06-09: 4 mg via INTRAVENOUS
  Filled 2017-06-09: qty 2

## 2017-06-09 MED ORDER — KETOROLAC TROMETHAMINE 30 MG/ML IJ SOLN
30.0000 mg | Freq: Once | INTRAMUSCULAR | Status: AC
  Administered 2017-06-09: 30 mg via INTRAVENOUS
  Filled 2017-06-09: qty 1

## 2017-06-09 MED ORDER — HYDROCODONE-ACETAMINOPHEN 5-325 MG PO TABS: 0.5000 | ORAL_TABLET | Freq: Four times a day (QID) | ORAL | 0 refills | Status: DC | PRN

## 2017-06-09 MED ORDER — ONDANSETRON 4 MG PO TBDP: 4.0000 mg | ORAL_TABLET | Freq: Three times a day (TID) | ORAL | 0 refills | Status: AC | PRN

## 2017-06-09 NOTE — Discharge Instructions (Signed)
Your workup in the emergency department was reassuring and did not suggest a concerning cause for your symptoms.  As they have been ongoing since the beginning of the year, we advise follow-up with a primary care doctor.  You have been given a resource guide with primary care services listed.  He would take Norco as needed for severe pain and Zofran for persistent nausea.  We do advise daily use of Zantac as stomach inflammation or reflux could be contributing slightly to your symptoms.  We are unable to rule out a muscular cause for your discomfort.  You may take ibuprofen or Aleve for this.  Continue your other daily medications.  Return to the emergency department if symptoms worsen.

## 2017-06-09 NOTE — ED Provider Notes (Signed)
[facility] DEPT Provider Note   CSN: [id number] Arrival date & time: 06/08/17  2215     History   Chief Complaint Chief Complaint  Patient presents with  . Abdominal Pain    HPI Sheryl Mathis is a 47 y.o. female.   47 year old female presents to the emergency department for evaluation of abdominal pain.  She has had pain to her left side since January 2019.  She states that pain is aching with intermittent sharp pains.  This is worse at nighttime.  She has since developed associated nausea which waxes and wanes in severity.  She has been using Zofran for this.  Patient denies any associated vomiting, bowel changes, urinary symptoms, fever.  Pain is not worse with deep breathing nor has she experienced any chest pain.  She has a history of endometrial cancer which was treated in 2015.  She has since been in remission.  She did express concern about the symptoms to her doctor who had an outpatient CT scan performed in February which was negative.  Abdominal surgical history significant for hysterectomy, cholecystectomy, C-section, and hernia repair     Past Medical History:  Diagnosis Date  . Anemia   . Anxiety   . Cancer [facility])    ENDOMETRIAL CANCER (tx completed 05/2013)  . Complication of anesthesia    PT STATES SHE WOKE UP AFTER D&C [place].  STATES NO PROBLEMS WAKING UP AFTER HER HYSTERECTOMY  . Depression   . Headache(784.0)    MIGRAINES  . History of radiation therapy 04/23/13, 04/29/13, 05/07/13, 05/14/13, 05/21/13   30 Gy to proximal vagina  . Menorrhagia   . Migraines   . Neuropathy     Patient Active Problem List   Diagnosis Date Noted  . 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 Medication reaction 09/29/2015  . Acute dystonia due to drugs 09/29/2015  . Severe episode of recurrent major depressive disorder, without psychotic features ([place])   . MDD (major depressive disorder), recurrent episode, severe  ([place]) 03/01/2015  . Vasomotor flushing 09/02/2014  . Vaginal candidiasis 09/02/2014  . Panic disorder 04/25/2014  . MDD (major depressive disorder), recurrent severe, without psychosis ([place]) 04/24/2014  . Obesity (BMI 30-39.9) 02/01/2014  . Uterine cancer ([place]) 03/31/2013  . History of uterine cancer 02/02/2013  . S/P laparoscopic assisted vaginal hysterectomy (LAVH) and bilateral salpingectomy (BS) on 01/22/13 01/22/2013    Past Surgical History:  Procedure Laterality Date  . CESAREAN SECTION     X2  . CHOLECYSTECTOMY    . DILITATION & CURRETTAGE/HYSTROSCOPY WITH HYDROTHERMAL ABLATION N/A 01/01/2013   Procedure: DILATATION & CURETTAGE/HYSTEROSCOPY WITH ATTEMPTED HYDROTHERMAL ABLATION: Change over to [place] @ 5573;  Surgeon: Osborne Oman, MD;  Location: [facility];  Service: Gynecology;  Laterality: N/A;  . HERNIA REPAIR  09/2016  . LAPAROSCOPIC ASSISTED VAGINAL HYSTERECTOMY N/A 01/22/2013   Procedure: LAPAROSCOPIC ASSISTED VAGINAL HYSTERECTOMY with Bilateral Fallopian Tubes and Ovaries;  Surgeon: Osborne Oman, MD;  Location: [facility];  Service: Gynecology;  Laterality: N/A;  . ROBOTIC ASSISTED TOTAL HYSTERECTOMY WITH BILATERAL SALPINGO OOPHERECTOMY Bilateral 03/31/2013   Procedure: ROBOTIC ASSISTED BILATERAL OOPHORECTOMY PELVIC AND POSSIBLE PARA-AORTIC LYMPH NODE DISSECTION;  Surgeon: Imagene Gurney A. Alycia Rossetti, MD;  Location: [facility];  Service: Gynecology;  Laterality: Bilateral;  . TUBAL LIGATION       OB History    Gravida  2   Para  2   Term  2   Preterm  AB      Living  2     SAB      TAB      Ectopic      Multiple      Live Births               Home Medications    Prior to Admission medications   Medication Sig Start Date End Date Taking? Authorizing Provider  benztropine (COGENTIN) 1 MG tablet Take 1 tablet (1 mg total) by mouth 2 (two) times daily. Patient taking differently: Take 1.5 mg by mouth 2 (two) times daily.  10/02/15   Caren Griffins, MD  busPIRone (BUSPAR) 10 MG tablet Take 10 mg by mouth 3 (three) times daily.    [provider]  clonazePAM (KLONOPIN) 0.5 MG tablet Take 1 tablet (0.5 mg total) by mouth 3 (three) times daily as needed (muscle spasms / twitching / anxiety). 10/02/15   Caren Griffins, MD  DULoxetine (CYMBALTA) 60 MG capsule Take 1 capsule (60 mg total) by mouth 2 (two) times daily. For depression 03/07/15   Lindell Spar I, NP  gabapentin (NEURONTIN) 300 MG capsule Take 300 mg by mouth 3 (three) times daily.    [provider]  HYDROcodone-acetaminophen (NORCO/VICODIN) 5-325 MG tablet Take 0.5-1 tablets by mouth every 6 (six) hours as needed. 06/09/17   Antonietta Breach, PA-C  hydrOXYzine (ATARAX/VISTARIL) 25 MG tablet Take 1 tablet (25 mg total) by mouth every 6 (six) hours as needed for anxiety. Patient taking differently: Take 25 mg by mouth every 8 (eight) hours as needed for anxiety.  03/07/15   Lindell Spar I, NP  ibuprofen (ADVIL,MOTRIN) 800 MG tablet Take 800 mg by mouth.    [provider]  loratadine (CLARITIN) 10 MG tablet Take 1 tablet (10 mg total) by mouth daily. For allergies 03/07/15   Lindell Spar I, NP  mupirocin cream (BACTROBAN) 2 % Apply 1 application topically 3 (three) times daily.    [provider]  omeprazole (PRILOSEC) 20 MG capsule Take 1 capsule (20 mg total) by mouth daily. For acid reflux 03/07/15   Lindell Spar I, NP  ondansetron (ZOFRAN ODT) 4 MG disintegrating tablet Take 1 tablet (4 mg total) by mouth every 8 (eight) hours as needed for nausea or vomiting. 06/09/17   Antonietta Breach, PA-C  sertraline (ZOLOFT) 25 MG tablet Take 25 mg by mouth daily.    [provider]  topiramate (TOPAMAX) 25 MG tablet Take 2 tablets (50 mg total) by mouth 2 (two) times daily. For migraine headache pain 03/07/15   Lindell Spar I, NP  triamcinolone cream (KENALOG) 0.1 % Apply 1 application topically 2 (two) times daily.    [provider]    Family  History Family History  Problem Relation Age of Onset  . Diabetes Mother   . Hypertension Mother   . Cancer Neg Hx     Social History Social History   Tobacco Use  . Smoking status: Former Smoker    Packs/day: 0.50    Years: 26.00    Pack years: 13.00    Types: Cigarettes    Last attempt to quit: 08/03/2013    Years since quitting: 3.8  . Smokeless tobacco: Never Used  Substance Use Topics  . Alcohol use: No    Alcohol/week: 0.0 oz  . Drug use: No     Allergies   Rexulti [brexpiprazole]; Fluoxetine; and Hydrocodone-acetaminophen   Review of Systems Review of Systems Ten systems reviewed  and are negative for acute change, except as noted in the HPI.    Physical Exam Updated Vital Signs BP 125/71   Pulse (!) 59   Temp 97.8 F (36.6 C) (Oral)   Resp 20   Ht 5\' 7"  (1.702 m)   Wt 118.4 kg (261 lb)   LMP 11/12/2012   SpO2 96%   BMI 40.88 kg/m   Physical Exam  Constitutional: She is oriented to person, place, and time. She appears well-developed and well-nourished. No distress.  Nontoxic appearing and in no acute distress  HENT:  Head: Normocephalic and atraumatic.  Eyes: Conjunctivae and EOM are normal. No scleral icterus.  Neck: Normal range of motion.  Cardiovascular: Normal rate, regular rhythm and intact distal pulses.  Pulmonary/Chest: Effort normal. No stridor. No respiratory distress.  Respirations even and unlabored  Abdominal: Soft. She exhibits no mass. There is tenderness. There is no guarding.  Minimal tenderness to the left side under the rib cage.  No palpable masses.  No peritoneal signs appreciated.  Musculoskeletal: Normal range of motion.  Neurological: She is alert and oriented to person, place, and time. She exhibits normal muscle tone. Coordination normal.  GCS 15.  Moving all extremities.  Skin: Skin is warm and dry. No rash noted. She is not diaphoretic. No erythema. No pallor.  Psychiatric: She has a normal mood and affect. Her  behavior is normal.  Nursing note and vitals reviewed.    ED Treatments / Results  Labs (all labs ordered are listed, but only abnormal results are displayed) Labs Reviewed  COMPREHENSIVE METABOLIC PANEL - Abnormal; Notable for the following components:      Result Value   Glucose, Bld 119 (*)    BUN 21 (*)    Total Protein 8.4 (*)    All other components within normal limits  URINALYSIS, ROUTINE W REFLEX MICROSCOPIC - Abnormal; Notable for the following components:   Color, Urine STRAW (*)    Ketones, ur 5 (*)    Bacteria, UA RARE (*)    Squamous Epithelial / LPF 0-5 (*)    All other components within normal limits  LIPASE, BLOOD  CBC  I-STAT BETA HCG BLOOD, ED ([facility], [facility], [facility] ONLY)    EKG None  Radiology No results found.  Procedures Procedures (including critical care time)  Medications Ordered in ED Medications  ketorolac (TORADOL) 30 MG/ML injection 30 mg (30 mg Intravenous Given 06/09/17 0313)  ondansetron (ZOFRAN) injection 4 mg (4 mg Intravenous Given 06/09/17 0313)     Initial Impression / Assessment and Plan / ED Course  I have reviewed the triage vital signs and the nursing notes.  Pertinent labs & imaging results that were available during my care of the patient were reviewed by me and considered in my medical decision making (see chart for details).     Patient presents to the emergency department for ongoing left flank pain.  She has had pain fairly consistently since January 2019.  She reports worsening pain when lying down for bed.  It is only slightly aggravated with movement, not aggravated with eating.  She does report associated nausea without vomiting.  Patient is afebrile today with stable, reassuring vital signs.  Her laboratory workup is noncontributory, without leukocytosis or electrolyte derangements.  Liver and kidney function preserved.  Given that symptoms have been chronic for the past 4 months with history of negative CT scan 2 months ago for  further evaluation of this pain, I do not currently see indication  for additional or repeat imaging.  I have discussed the possibility of repeat CT scan with the patient.  She is comfortable with continuing outpatient follow-up at this time.  Patient given prescription for Norco to use for pain management as needed.  Will give Zofran for nausea management.  Return precautions discussed and provided. Patient discharged in stable condition with no unaddressed concerns.   Final Clinical Impressions(s) / ED Diagnoses   Final diagnoses:  Chronic left flank pain    ED Discharge Orders        Ordered    HYDROcodone-acetaminophen (NORCO/VICODIN) 5-325 MG tablet  Every 6 hours PRN     06/09/17 0306    ondansetron (ZOFRAN ODT) 4 MG disintegrating tablet  Every 8 hours PRN     06/09/17 0306       Antonietta Breach, PA-C 06/09/17 [place], April, MD 06/09/17 [id number]

## 2018-03-19 ENCOUNTER — Ambulatory Visit: Payer: Self-pay | Admitting: Student

## 2018-03-19 DIAGNOSIS — S42492A Other displaced fracture of lower end of left humerus, initial encounter for closed fracture: Secondary | ICD-10-CM | POA: Insufficient documentation

## 2018-03-19 NOTE — H&P (Signed)
Orthopaedic Trauma Service (OTS) Consult   Patient ID: Sheryl Mathis MRN: [id number] DOB/AGE: 04/19/1970 48 y.o.  Reason for Surgery: Left distal humerus fracture  HPI: Sheryl Mathis is an 48 y.o. female presenting for surgery of left distal humerus. Patient slipped and fell on a wet floor and landed on her left arm while at work on 03/16/18. States she had immediate pain and swelling of her fingers. Denies any visible deformity of the arm and denies sustaining any other injuries when she fell. She was seen in [facility] emergency department where x-rays and CT revealed a left distal humerus fracture. She was placed in a long arm splint and a sling. She was made non-weightbearing on the left upper extremity, was provided pain medication, and was instructed to follow up with orthopedic surgery. Pain medication has not provided much relieve for the patient, pain is currently making her feel nauseous.   Patient is right-hand dominant. 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 She is currently a 1/2 pack per day tobacco user.   Past Medical History:  Diagnosis Date  . Anemia   . Anxiety   . Cancer [facility])    ENDOMETRIAL CANCER (tx completed 05/2013)  . Complication of anesthesia    PT STATES SHE WOKE UP AFTER D&C [place].  STATES NO PROBLEMS WAKING UP AFTER HER HYSTERECTOMY  . Depression   . Headache(784.0)    MIGRAINES  . History of radiation therapy 04/23/13, 04/29/13, 05/07/13, 05/14/13, 05/21/13   30 Gy to proximal vagina  . Menorrhagia   . Migraines   . Neuropathy     Past Surgical History:  Procedure Laterality Date  . CESAREAN SECTION     X2  . CHOLECYSTECTOMY    . DILITATION & CURRETTAGE/HYSTROSCOPY WITH HYDROTHERMAL ABLATION N/A 01/01/2013   Procedure: DILATATION & CURETTAGE/HYSTEROSCOPY WITH ATTEMPTED HYDROTHERMAL ABLATION: Change over to [place] @ 4401;  Surgeon: Sheryl Oman, MD;  Location: [facility];  Service: Gynecology;  Laterality: N/A;  . HERNIA REPAIR  09/2016  . LAPAROSCOPIC ASSISTED VAGINAL HYSTERECTOMY N/A 01/22/2013   Procedure: LAPAROSCOPIC ASSISTED VAGINAL HYSTERECTOMY with Bilateral Fallopian Tubes and Ovaries;  Surgeon: Sheryl Oman, MD;  Location: [facility];  Service: Gynecology;  Laterality: N/A;  . ROBOTIC ASSISTED TOTAL HYSTERECTOMY WITH BILATERAL SALPINGO OOPHERECTOMY Bilateral 03/31/2013   Procedure: ROBOTIC ASSISTED BILATERAL OOPHORECTOMY PELVIC AND POSSIBLE PARA-AORTIC LYMPH NODE DISSECTION;  Surgeon: Sheryl Gurney A. Alycia Rossetti, MD;  Location: [facility];  Service: Gynecology;  Laterality: Bilateral;  . TUBAL LIGATION      Family History  Problem Relation Age of Onset  . Diabetes Mother   . Hypertension Mother   . Cancer Neg Hx     Social History: Patient currently smokes 1/2 pack of cigarettes per day. She has smoked for about 18 years. She has never used smokeless tobacco. She is interested in smoking cessation at this time. She reports that she does not drink alcohol or use drugs.     Allergies:  Allergies  Allergen Reactions  . Rexulti [Brexpiprazole] Other (See Comments)    Acute severe dystonic reaction (neck/ arm spasms, stuttering speech), Jul 2017  . Fluoxetine Other (See Comments)    Paranoia, altered mental status  . Hydrocodone-Acetaminophen Other (See Comments)    Too strong    Medications: I have reviewed the patient's current medications.  ROS: Constitutional: No fever or chills Vision: No changes in vision ENT: No difficulty swallowing  CV: No chest pain Pulm: No SOB or wheezing GI: Feeling nauseous due to pain GU: No urgency or inability to hold urine Skin: No poor wound healing Neurologic: Notes some tingling in left arm Psychiatric: has history of depression an anxiety Heme: No history of easy bruising. Allergic: No reaction to food. Patient reports morphine causes urticaria   Exam: Last menstrual period 11/12/2012. General: Well  appearing, in no acute distress. Orientation: Alert and oriented x 3 Mood and Affect: Pleasant and cooperative Gait: Within normal limits Coordination and balance: Within normal limits  Left Upper extremity: Long arm splint in place. Significant swelling noted in the fingers. Tenderness to palpation of mid shaft of humerus through splint. No tenderness to palpation of clavicle, shoulder, or below elbow. Able to wiggle fingers. Sensation intact. + Radial pulse with brisk cap refill.  Right upper extremity: Skin without lesions. No tenderness to palpation. Full painless ROM, full strength in each muscle groups without evidence of instability. Sensory and motor function intact. Neurovascularly intact.   Medical Decision Making: Imaging: X-rays and CT of left humerus revealed comminuted distal humerus fracture with intraarticular extension  Labs: No results found for this or any previous visit (from the past 24 hour(s)).   Medical history and chart was reviewed  Assessment/Plan: 48 year old female s/p fall at work on 03/16/18 which resulted in left distal humerus fracture with intra-articular extension. She has a severe injury. Due to her young age, activity level, and severity of injury, I recommend proceeding with open reduction internal fixation.  Did discuss with her and her mother the risk of posttraumatic arthritis and stiffness. Other risks included but not limited to bleeding, infection, nonunion, malunion, nerve and blood vessel injury, DVT, even the possibility of loss of limb.  Patient agrees to proceed with surgery. Questions were encouraged and answered to her satisfaction. Consent was obtained.  Sheryl Mathis [facility] ?[phone]? (phone)

## 2018-03-20 ENCOUNTER — Encounter (HOSPITAL_COMMUNITY): Payer: Self-pay | Admitting: *Deleted

## 2018-03-20 ENCOUNTER — Other Ambulatory Visit: Payer: Self-pay

## 2018-03-20 MED ORDER — DEXTROSE 5 % IV SOLN
3.0000 g | INTRAVENOUS | Status: AC
  Administered 2018-03-21: 3 g via INTRAVENOUS
  Filled 2018-03-20: qty 3

## 2018-03-20 NOTE — Anesthesia Preprocedure Evaluation (Addendum)
Anesthesia Evaluation  Patient identified by MRN, date of birth, ID band Patient awake    Reviewed: Allergy & Precautions, NPO status , Patient's Chart, lab work & pertinent test results  History of Anesthesia Complications (+) PONV  Airway Mallampati: II  TM Distance: >3 FB Neck ROM: Full    Dental no notable dental hx. (+) Teeth Intact, Dental Advisory Given   Pulmonary former smoker,    Pulmonary exam normal breath sounds clear to auscultation       Cardiovascular Exercise Tolerance: Good Normal cardiovascular exam Rhythm:Regular Rate:Normal     Neuro/Psych  Headaches, Anxiety Depression    GI/Hepatic Neg liver ROS,   Endo/Other  negative endocrine ROS  Renal/GU negative Renal ROS     Musculoskeletal negative musculoskeletal ROS (+)   Abdominal   Peds  Hematology  (+) anemia ,   Anesthesia Other Findings L distal radius fx  Hx of endometrial CA  Reproductive/Obstetrics                            Lab Results  Component Value Date   WBC 10.1 06/08/2017   HGB 13.4 06/08/2017   HCT 39.4 06/08/2017   MCV 87.4 06/08/2017   PLT 349 06/08/2017    Anesthesia Physical Anesthesia Plan  ASA: III  Anesthesia Plan: General   Post-op Pain Management:  Regional for Post-op pain   Induction: Intravenous  PONV Risk Score and Plan: 4 or greater and Treatment may vary due to age or medical condition, Scopolamine patch - Pre-op, Dexamethasone, Ondansetron and Midazolam  Airway Management Planned: Oral ETT  Additional Equipment:   Intra-op Plan:   Post-operative Plan: Extubation in OR  Informed Consent: I have reviewed the patients History and Physical, chart, labs and discussed the procedure including the risks, benefits and alternatives for the proposed anesthesia with the patient or authorized representative who has indicated his/her understanding and acceptance.     Dental  advisory given  Plan Discussed with: CRNA  Anesthesia Plan Comments:         Anesthesia Quick Evaluation

## 2018-03-21 ENCOUNTER — Encounter (HOSPITAL_COMMUNITY): Payer: Self-pay | Admitting: General Practice

## 2018-03-21 ENCOUNTER — Ambulatory Visit (HOSPITAL_COMMUNITY): Payer: No Typology Code available for payment source | Admitting: Certified Registered Nurse Anesthetist

## 2018-03-21 ENCOUNTER — Ambulatory Visit (HOSPITAL_COMMUNITY): Payer: No Typology Code available for payment source

## 2018-03-21 ENCOUNTER — Observation Stay (HOSPITAL_COMMUNITY): Payer: No Typology Code available for payment source

## 2018-03-21 ENCOUNTER — Other Ambulatory Visit: Payer: Self-pay

## 2018-03-21 ENCOUNTER — Observation Stay (HOSPITAL_COMMUNITY)
Admission: RE | Admit: 2018-03-21 | Discharge: 2018-03-22 | Disposition: A | Discharge status: Home / Self Care | Payer: No Typology Code available for payment source | Attending: Student | Admitting: Student

## 2018-03-21 ENCOUNTER — Encounter (HOSPITAL_COMMUNITY)
Admission: RE | Disposition: A | Discharge status: Home / Self Care | Payer: Self-pay | Source: Home / Self Care | Attending: Student

## 2018-03-21 DIAGNOSIS — Z888 Allergy status to other drugs, medicaments and biological substances status: Secondary | ICD-10-CM | POA: Insufficient documentation

## 2018-03-21 DIAGNOSIS — F17210 Nicotine dependence, cigarettes, uncomplicated: Secondary | ICD-10-CM | POA: Diagnosis not present

## 2018-03-21 DIAGNOSIS — W010XXA Fall on same level from slipping, tripping and stumbling without subsequent striking against object, initial encounter: Secondary | ICD-10-CM | POA: Insufficient documentation

## 2018-03-21 DIAGNOSIS — G629 Polyneuropathy, unspecified: Secondary | ICD-10-CM | POA: Insufficient documentation

## 2018-03-21 DIAGNOSIS — Z885 Allergy status to narcotic agent status: Secondary | ICD-10-CM | POA: Insufficient documentation

## 2018-03-21 DIAGNOSIS — S42402A Unspecified fracture of lower end of left humerus, initial encounter for closed fracture: Secondary | ICD-10-CM | POA: Diagnosis present

## 2018-03-21 DIAGNOSIS — Y990 Civilian activity done for income or pay: Secondary | ICD-10-CM | POA: Insufficient documentation

## 2018-03-21 DIAGNOSIS — Z8542 Personal history of malignant neoplasm of other parts of uterus: Secondary | ICD-10-CM | POA: Insufficient documentation

## 2018-03-21 DIAGNOSIS — T148XXA Other injury of unspecified body region, initial encounter: Secondary | ICD-10-CM

## 2018-03-21 DIAGNOSIS — G43909 Migraine, unspecified, not intractable, without status migrainosus: Secondary | ICD-10-CM | POA: Insufficient documentation

## 2018-03-21 DIAGNOSIS — Z79891 Long term (current) use of opiate analgesic: Secondary | ICD-10-CM | POA: Diagnosis not present

## 2018-03-21 DIAGNOSIS — Z419 Encounter for procedure for purposes other than remedying health state, unspecified: Secondary | ICD-10-CM

## 2018-03-21 DIAGNOSIS — Z79899 Other long term (current) drug therapy: Secondary | ICD-10-CM | POA: Diagnosis not present

## 2018-03-21 DIAGNOSIS — Z23 Encounter for immunization: Secondary | ICD-10-CM | POA: Insufficient documentation

## 2018-03-21 DIAGNOSIS — S42492A Other displaced fracture of lower end of left humerus, initial encounter for closed fracture: Secondary | ICD-10-CM | POA: Diagnosis not present

## 2018-03-21 DIAGNOSIS — F419 Anxiety disorder, unspecified: Secondary | ICD-10-CM | POA: Insufficient documentation

## 2018-03-21 DIAGNOSIS — F329 Major depressive disorder, single episode, unspecified: Secondary | ICD-10-CM | POA: Insufficient documentation

## 2018-03-21 HISTORY — DX: Other specified postprocedural states: R11.2

## 2018-03-21 HISTORY — PX: ORIF HUMERUS FRACTURE: SHX2126

## 2018-03-21 HISTORY — DX: Fatty (change of) liver, not elsewhere classified: K76.0

## 2018-03-21 HISTORY — DX: Other specified postprocedural states: Z98.890

## 2018-03-21 LAB — CBC
HCT: 45.7 % (ref 36.0–46.0)
Hemoglobin: 14.6 g/dL (ref 12.0–15.0)
MCH: 28.3 pg (ref 26.0–34.0)
MCHC: 31.9 g/dL (ref 30.0–36.0)
MCV: 88.7 fL (ref 80.0–100.0)
Platelets: 413 10*3/uL — ABNORMAL HIGH (ref 150–400)
RBC: 5.15 MIL/uL — ABNORMAL HIGH (ref 3.87–5.11)
RDW: 12.6 % (ref 11.5–15.5)
WBC: 8.8 10*3/uL (ref 4.0–10.5)
nRBC: 0 % (ref 0.0–0.2)

## 2018-03-21 LAB — BASIC METABOLIC PANEL
Anion gap: 10 (ref 5–15)
BUN: 11 mg/dL (ref 6–20)
CO2: 26 mmol/L (ref 22–32)
Calcium: 9.5 mg/dL (ref 8.9–10.3)
Chloride: 101 mmol/L (ref 98–111)
Creatinine, Ser: 0.67 mg/dL (ref 0.44–1.00)
GFR calc Af Amer: >60 mL/min (ref >60)
GFR calc non Af Amer: >60 mL/min (ref >60)
Glucose, Bld: 98 mg/dL (ref 70–99)
Potassium: 4.2 mmol/L (ref 3.5–5.1)
SODIUM: 137 mmol/L (ref 135–145)

## 2018-03-21 SURGERY — OPEN REDUCTION INTERNAL FIXATION (ORIF) DISTAL HUMERUS FRACTURE
Anesthesia: General | Laterality: Left

## 2018-03-21 MED ORDER — SUGAMMADEX SODIUM 200 MG/2ML IV SOLN
INTRAVENOUS | Status: DC | PRN
  Administered 2018-03-21: 235.8 mg via INTRAVENOUS

## 2018-03-21 MED ORDER — ACETAMINOPHEN 500 MG PO TABS
ORAL_TABLET | ORAL | Status: AC
  Filled 2018-03-21: qty 2

## 2018-03-21 MED ORDER — OXYCODONE HCL 5 MG PO TABS
ORAL_TABLET | ORAL | Status: AC
  Administered 2018-03-21: 14:00:00
  Filled 2018-03-21: qty 1

## 2018-03-21 MED ORDER — ACETAMINOPHEN 10 MG/ML IV SOLN
INTRAVENOUS | Status: AC
  Filled 2018-03-21: qty 100

## 2018-03-21 MED ORDER — ACETAMINOPHEN 325 MG PO TABS
650.0000 mg | ORAL_TABLET | Freq: Four times a day (QID) | ORAL | Status: DC
  Administered 2018-03-21 – 2018-03-22 (×3): 650 mg via ORAL
  Filled 2018-03-21 (×2): qty 2

## 2018-03-21 MED ORDER — ROCURONIUM BROMIDE 50 MG/5ML IV SOSY
PREFILLED_SYRINGE | INTRAVENOUS | Status: AC
  Filled 2018-03-21: qty 15

## 2018-03-21 MED ORDER — OXYCODONE HCL 5 MG/5ML PO SOLN: 5.0000 mg | Freq: Once | ORAL | Status: AC | PRN

## 2018-03-21 MED ORDER — ROCURONIUM BROMIDE 100 MG/10ML IV SOLN
INTRAVENOUS | Status: DC | PRN
  Administered 2018-03-21: 10 mg via INTRAVENOUS
  Administered 2018-03-21: 50 mg via INTRAVENOUS
  Administered 2018-03-21 (×2): 20 mg via INTRAVENOUS

## 2018-03-21 MED ORDER — LIDOCAINE 2% (20 MG/ML) 5 ML SYRINGE
INTRAMUSCULAR | Status: AC
  Filled 2018-03-21: qty 10

## 2018-03-21 MED ORDER — BACITRACIN ZINC 500 UNIT/GM EX OINT
TOPICAL_OINTMENT | CUTANEOUS | Status: AC
  Filled 2018-03-21: qty 28.35

## 2018-03-21 MED ORDER — MEPERIDINE HCL 50 MG/ML IJ SOLN: 6.2500 mg | INTRAMUSCULAR | Status: DC | PRN

## 2018-03-21 MED ORDER — FENTANYL CITRATE (PF) 250 MCG/5ML IJ SOLN
INTRAMUSCULAR | Status: DC | PRN
  Administered 2018-03-21 (×3): 50 ug via INTRAVENOUS
  Administered 2018-03-21: 100 ug via INTRAVENOUS

## 2018-03-21 MED ORDER — ONDANSETRON HCL 4 MG/2ML IJ SOLN
INTRAMUSCULAR | Status: DC | PRN
  Administered 2018-03-21: 4 mg via INTRAVENOUS

## 2018-03-21 MED ORDER — ACETAMINOPHEN 500 MG PO TABS
1000.0000 mg | ORAL_TABLET | Freq: Once | ORAL | Status: AC
  Administered 2018-03-21: 1000 mg via ORAL

## 2018-03-21 MED ORDER — INFLUENZA VAC SPLIT QUAD 0.5 ML IM SUSY
0.5000 mL | PREFILLED_SYRINGE | INTRAMUSCULAR | Status: AC
  Administered 2018-03-22: 0.5 mL via INTRAMUSCULAR
  Filled 2018-03-21: qty 0.5

## 2018-03-21 MED ORDER — LACTATED RINGERS IV SOLN
INTRAVENOUS | Status: DC | PRN
  Administered 2018-03-21: 07:00:00 via INTRAVENOUS

## 2018-03-21 MED ORDER — PANTOPRAZOLE SODIUM 40 MG PO TBEC
40.0000 mg | DELAYED_RELEASE_TABLET | Freq: Every day | ORAL | Status: DC
  Administered 2018-03-21 – 2018-03-22 (×2): 40 mg via ORAL
  Filled 2018-03-21 (×3): qty 1

## 2018-03-21 MED ORDER — PROMETHAZINE HCL 25 MG/ML IJ SOLN
25.0000 mg | Freq: Four times a day (QID) | INTRAMUSCULAR | Status: DC | PRN
  Administered 2018-03-21: 25 mg via INTRAVENOUS
  Filled 2018-03-21: qty 1

## 2018-03-21 MED ORDER — ACETAMINOPHEN 10 MG/ML IV SOLN
1000.0000 mg | Freq: Once | INTRAVENOUS | Status: DC | PRN
  Administered 2018-03-21: 1000 mg via INTRAVENOUS

## 2018-03-21 MED ORDER — DEXAMETHASONE SODIUM PHOSPHATE 10 MG/ML IJ SOLN
INTRAMUSCULAR | Status: AC
  Filled 2018-03-21: qty 1

## 2018-03-21 MED ORDER — HYDROMORPHONE HCL 1 MG/ML IJ SOLN
1.0000 mg | INTRAMUSCULAR | Status: DC | PRN
  Administered 2018-03-21 – 2018-03-22 (×5): 1 mg via INTRAVENOUS
  Filled 2018-03-21 (×5): qty 1

## 2018-03-21 MED ORDER — SCOPOLAMINE 1 MG/3DAYS TD PT72: 1.0000 | MEDICATED_PATCH | TRANSDERMAL | Status: DC

## 2018-03-21 MED ORDER — OXYCODONE-ACETAMINOPHEN 5-325 MG PO TABS
1.0000 | ORAL_TABLET | ORAL | Status: DC | PRN
  Administered 2018-03-22 (×2): 2 via ORAL
  Filled 2018-03-21 (×2): qty 2

## 2018-03-21 MED ORDER — PROPOFOL 10 MG/ML IV BOLUS
INTRAVENOUS | Status: AC
  Filled 2018-03-21: qty 20

## 2018-03-21 MED ORDER — ONDANSETRON 4 MG PO TBDP
4.0000 mg | ORAL_TABLET | Freq: Three times a day (TID) | ORAL | Status: DC | PRN
  Administered 2018-03-21: 4 mg via ORAL
  Filled 2018-03-21: qty 1

## 2018-03-21 MED ORDER — HYDROMORPHONE HCL 1 MG/ML IJ SOLN
INTRAMUSCULAR | Status: AC
  Administered 2018-03-21: 14:00:00
  Filled 2018-03-21: qty 2

## 2018-03-21 MED ORDER — VANCOMYCIN HCL 1000 MG IV SOLR
INTRAVENOUS | Status: DC | PRN
  Administered 2018-03-21: 1000 mg

## 2018-03-21 MED ORDER — LIDOCAINE HCL (CARDIAC) PF 100 MG/5ML IV SOSY
PREFILLED_SYRINGE | INTRAVENOUS | Status: DC | PRN
  Administered 2018-03-21: 20 mg via INTRAVENOUS
  Administered 2018-03-21: 180 mg via INTRAVENOUS

## 2018-03-21 MED ORDER — DEXAMETHASONE SODIUM PHOSPHATE 4 MG/ML IJ SOLN
INTRAMUSCULAR | Status: DC | PRN
  Administered 2018-03-21: 5 mg via INTRAVENOUS

## 2018-03-21 MED ORDER — MIDAZOLAM HCL 2 MG/2ML IJ SOLN
INTRAMUSCULAR | Status: AC
  Filled 2018-03-21: qty 2

## 2018-03-21 MED ORDER — HYDROMORPHONE HCL 1 MG/ML IJ SOLN
0.2500 mg | INTRAMUSCULAR | Status: DC | PRN
  Administered 2018-03-21 (×4): 0.5 mg via INTRAVENOUS

## 2018-03-21 MED ORDER — PROPOFOL 10 MG/ML IV BOLUS
INTRAVENOUS | Status: DC | PRN
  Administered 2018-03-21: 200 mg via INTRAVENOUS

## 2018-03-21 MED ORDER — OXYCODONE HCL 5 MG PO TABS
5.0000 mg | ORAL_TABLET | Freq: Once | ORAL | Status: AC | PRN
  Administered 2018-03-21: 5 mg via ORAL

## 2018-03-21 MED ORDER — VANCOMYCIN HCL 1000 MG IV SOLR
INTRAVENOUS | Status: AC
  Filled 2018-03-21: qty 1000

## 2018-03-21 MED ORDER — MIDAZOLAM HCL 5 MG/5ML IJ SOLN
INTRAMUSCULAR | Status: DC | PRN
  Administered 2018-03-21 (×2): 1 mg via INTRAVENOUS

## 2018-03-21 MED ORDER — CEFAZOLIN SODIUM-DEXTROSE 2-4 GM/100ML-% IV SOLN
2.0000 g | Freq: Three times a day (TID) | INTRAVENOUS | Status: AC
  Administered 2018-03-21 – 2018-03-22 (×3): 2 g via INTRAVENOUS
  Filled 2018-03-21 (×3): qty 100

## 2018-03-21 MED ORDER — FENTANYL CITRATE (PF) 250 MCG/5ML IJ SOLN
INTRAMUSCULAR | Status: AC
  Filled 2018-03-21: qty 5

## 2018-03-21 MED ORDER — ROPIVACAINE HCL 5 MG/ML IJ SOLN
INTRAMUSCULAR | Status: DC | PRN
  Administered 2018-03-21: 30 mL via PERINEURAL

## 2018-03-21 MED ORDER — SCOPOLAMINE 1 MG/3DAYS TD PT72
MEDICATED_PATCH | TRANSDERMAL | Status: AC
  Administered 2018-03-21: 07:00:00
  Filled 2018-03-21: qty 1

## 2018-03-21 MED ORDER — 0.9 % SODIUM CHLORIDE (POUR BTL) OPTIME
TOPICAL | Status: DC | PRN
  Administered 2018-03-21: 1000 mL

## 2018-03-21 MED ORDER — ONDANSETRON HCL 4 MG/2ML IJ SOLN
INTRAMUSCULAR | Status: AC
  Filled 2018-03-21: qty 2

## 2018-03-21 SURGICAL SUPPLY — 89 items
BANDAGE ACE 4X5 VEL STRL LF (GAUZE/BANDAGES/DRESSINGS) ×2 IMPLANT
BIT DRILL 2.5X110 QC LCP DISP (BIT) ×2 IMPLANT
BIT DRILL CANN LRG QC 5X300 (BIT) ×2 IMPLANT
BIT DRILL LCP QC 2X140 (BIT) ×4 IMPLANT
BIT DRILL QC 2.7MM (BIT) IMPLANT
BLADE AVERAGE 25MMX9MM (BLADE) ×1
BLADE AVERAGE 25X9 (BLADE) ×1 IMPLANT
BNDG ELASTIC 6X10 VLCR STRL LF (GAUZE/BANDAGES/DRESSINGS) ×2 IMPLANT
BNDG ESMARK 4X9 LF (GAUZE/BANDAGES/DRESSINGS) ×1 IMPLANT
BNDG GAUZE ELAST 4 BULKY (GAUZE/BANDAGES/DRESSINGS) ×2 IMPLANT
BRUSH SCRUB SURG 4.25 DISP (MISCELLANEOUS) ×4 IMPLANT
CORDS BIPOLAR (ELECTRODE) ×3 IMPLANT
COVER SURGICAL LIGHT HANDLE (MISCELLANEOUS) ×6 IMPLANT
COVER WAND RF STERILE (DRAPES) ×1 IMPLANT
DRAIN PENROSE 1/4X12 LTX STRL (WOUND CARE) IMPLANT
DRAPE C-ARM 42X72 X-RAY (DRAPES) ×3 IMPLANT
DRAPE C-ARMOR (DRAPES) ×2 IMPLANT
DRAPE HALF SHEET 40X57 (DRAPES) ×3 IMPLANT
DRAPE INCISE IOBAN 66X45 STRL (DRAPES) IMPLANT
DRAPE ORTHO SPLIT 77X108 STRL (DRAPES)
DRAPE SURG ORHT 6 SPLT 77X108 (DRAPES) IMPLANT
DRAPE U-SHAPE 47X51 STRL (DRAPES) ×6 IMPLANT
DRSG ADAPTIC 3X8 NADH LF (GAUZE/BANDAGES/DRESSINGS) ×1 IMPLANT
DRSG MEPILEX BORDER 4X8 (GAUZE/BANDAGES/DRESSINGS) ×2 IMPLANT
DRSG PAD ABDOMINAL 8X10 ST (GAUZE/BANDAGES/DRESSINGS) ×3 IMPLANT
ELECT REM PT RETURN 9FT ADLT (ELECTROSURGICAL) ×3
ELECTRODE REM PT RTRN 9FT ADLT (ELECTROSURGICAL) ×1 IMPLANT
EVACUATOR 1/8 PVC DRAIN (DRAIN) IMPLANT
GAUZE SPONGE 4X4 12PLY STRL (GAUZE/BANDAGES/DRESSINGS) ×2 IMPLANT
GLOVE BIO SURGEON STRL SZ 6.5 (GLOVE) ×6 IMPLANT
GLOVE BIO SURGEON STRL SZ7.5 (GLOVE) ×5 IMPLANT
GLOVE BIO SURGEONS STRL SZ 6.5 (GLOVE) ×3
GLOVE BIOGEL PI IND STRL 6.5 (GLOVE) ×1 IMPLANT
GLOVE BIOGEL PI IND STRL 7.5 (GLOVE) ×1 IMPLANT
GLOVE BIOGEL PI IND STRL 8 (GLOVE) ×1 IMPLANT
GLOVE BIOGEL PI INDICATOR 6.5 (GLOVE) ×2
GLOVE BIOGEL PI INDICATOR 7.5 (GLOVE) ×4
GLOVE BIOGEL PI INDICATOR 8 (GLOVE) ×4
GOWN STRL REUS W/ TWL LRG LVL3 (GOWN DISPOSABLE) ×2 IMPLANT
GOWN STRL REUS W/ TWL XL LVL3 (GOWN DISPOSABLE) ×1 IMPLANT
GOWN STRL REUS W/TWL LRG LVL3 (GOWN DISPOSABLE) ×6
GOWN STRL REUS W/TWL XL LVL3 (GOWN DISPOSABLE) ×4
GUIDEWIRE THREADED 2.8 (WIRE) ×2 IMPLANT
KIT BASIN OR (CUSTOM PROCEDURE TRAY) ×3 IMPLANT
KIT TURNOVER KIT B (KITS) ×3 IMPLANT
MANIFOLD NEPTUNE II (INSTRUMENTS) ×3 IMPLANT
NDL HYPO 25X1 1.5 SAFETY (NEEDLE) ×1 IMPLANT
NEEDLE HYPO 25X1 1.5 SAFETY (NEEDLE) IMPLANT
NS IRRIG 1000ML POUR BTL (IV SOLUTION) ×3 IMPLANT
PACK ORTHO EXTREMITY (CUSTOM PROCEDURE TRAY) ×3 IMPLANT
PAD ARMBOARD 7.5X6 YLW CONV (MISCELLANEOUS) ×6 IMPLANT
PAD CAST 4YDX4 CTTN HI CHSV (CAST SUPPLIES) IMPLANT
PADDING CAST COTTON 4X4 STRL (CAST SUPPLIES) ×4
PLATE COMP VA-LCP F 2.7X69 1H (Plate) ×2 IMPLANT
PLATE HUMERUS 4H LT 2.7/3.5 (Plate) ×2 IMPLANT
PROS DRILL BIT QC 2.7MM (BIT) ×3
SCREW CANN 6.5X125X32 (Screw) ×2 IMPLANT
SCREW CORTEX 2.7 SLF-TPNG 18MM (Screw) ×4 IMPLANT
SCREW CORTEX 2.7X30 (Screw) ×2 IMPLANT
SCREW CORTEX 2.7X50 SLF TPNG (Screw) ×2 IMPLANT
SCREW CORTICAL 5 1.9 2.7 42 (Screw) ×2 IMPLANT
SCREW LOCK T8 22X2.7XST VA (Screw) IMPLANT
SCREW LOCK VA ST 2.7X14 (Screw) ×2 IMPLANT
SCREW LOCK VA ST 2.7X18 (Screw) ×2 IMPLANT
SCREW LOCK VA ST 2.7X20 (Screw) ×2 IMPLANT
SCREW LOCK VA ST 2.7X26 (Screw) ×2 IMPLANT
SCREW LOCKING 2.7X22MM (Screw) ×2 IMPLANT
SCREW LOCKING 2.7X28 (Screw) ×2 IMPLANT
SCREW LOCKING VA 2.7X50MM (Screw) ×2 IMPLANT
SCREW METAPHYSEAL 2.7X28 (Screw) ×2 IMPLANT
SCREW METAPHYSEAL 2.7X32MM (Screw) ×2 IMPLANT
SPONGE LAP 18X18 X RAY DECT (DISPOSABLE) IMPLANT
STAPLER VISISTAT 35W (STAPLE) ×3 IMPLANT
STOCKINETTE IMPERVIOUS 9X36 MD (GAUZE/BANDAGES/DRESSINGS) IMPLANT
SUCTION FRAZIER HANDLE 10FR (MISCELLANEOUS) ×2
SUCTION TUBE FRAZIER 10FR DISP (MISCELLANEOUS) ×1 IMPLANT
SUT ETHILON 3 0 PS 1 (SUTURE) ×6 IMPLANT
SUT VIC AB 0 CT1 27 (SUTURE) ×4
SUT VIC AB 0 CT1 27XBRD ANBCTR (SUTURE) ×2 IMPLANT
SUT VIC AB 2-0 CT1 27 (SUTURE) ×4
SUT VIC AB 2-0 CT1 TAPERPNT 27 (SUTURE) ×2 IMPLANT
SYR 5ML LL (SYRINGE) IMPLANT
SYR CONTROL 10ML LL (SYRINGE) ×3 IMPLANT
TOWEL OR 17X24 6PK STRL BLUE (TOWEL DISPOSABLE) ×3 IMPLANT
TOWEL OR 17X26 10 PK STRL BLUE (TOWEL DISPOSABLE) ×7 IMPLANT
TRAY FOLEY MTR SLVR 16FR STAT (SET/KITS/TRAYS/PACK) IMPLANT
WASHER FOR 5.0 SCREWS (Washer) ×2 IMPLANT
WATER STERILE IRR 1000ML POUR (IV SOLUTION) ×1 IMPLANT
YANKAUER SUCT BULB TIP NO VENT (SUCTIONS) IMPLANT

## 2018-03-21 NOTE — Anesthesia Procedure Notes (Signed)
Anesthesia Regional Block: Interscalene brachial plexus block   Pre-Anesthetic Checklist: ,, timeout performed, Correct Patient, Correct Site, Correct Laterality, Correct Procedure, Correct Position, site marked, Risks and benefits discussed,  Surgical consent,  Pre-op evaluation,  At surgeon's request and post-op pain management  Laterality: Upper and Left  Prep: Maximum Sterile Barrier Precautions used, chloraprep       Needles:  Injection technique: Single-shot  Needle Type: Echogenic Needle     Needle Length: 5cm  Needle Gauge: 21     Additional Needles:   Procedures:,,,, ultrasound used (permanent image in chart),,,,  Narrative:  Start time: 03/21/2018 7:10 AM End time: 03/21/2018 7:24 AM Injection made incrementally with aspirations every 5 mL.  Performed by: Personally  Anesthesiologist: Barnet Glasgow, MD  Additional Notes: Block assessed prior to procedure. Patient tolerated procedure well.

## 2018-03-21 NOTE — Anesthesia Procedure Notes (Signed)
Procedure Name: Intubation Date/Time: 03/21/2018 7:47 AM Performed by: Glynda Jaeger, CRNA Pre-anesthesia Checklist: Patient identified, Patient being monitored, Timeout performed, Emergency Drugs available and Suction available Patient Re-evaluated:Patient Re-evaluated prior to induction Oxygen Delivery Method: Circle System Utilized Preoxygenation: Pre-oxygenation with 100% oxygen Induction Type: IV induction Ventilation: Mask ventilation without difficulty Laryngoscope Size: Mac and 3 Grade View: Grade I Tube type: Oral Tube size: 7.0 mm Number of attempts: 1 Airway Equipment and Method: Stylet Placement Confirmation: ETT inserted through vocal cords under direct vision,  positive ETCO2 and breath sounds checked- equal and bilateral Secured at: 21 cm Tube secured with: Tape Dental Injury: Teeth and Oropharynx as per pre-operative assessment

## 2018-03-21 NOTE — Interval H&P Note (Signed)
History and Physical Interval Note:  03/21/2018 7:11 AM  Sheryl Mathis  has presented today for surgery, with the diagnosis of Left distal humerus fracture  The various methods of treatment have been discussed with the patient and family. After consideration of risks, benefits and other options for treatment, the patient has consented to  Procedure(s): OPEN REDUCTION INTERNAL FIXATION (ORIF) DISTAL HUMERUS FRACTURE (Left) as a surgical intervention .  The patient's history has been reviewed, patient examined, no change in status, stable for surgery.  I have reviewed the patient's chart and labs.  Questions were answered to the patient's satisfaction.     Lennette Bihari P Mechelle Pates

## 2018-03-21 NOTE — Op Note (Signed)
Orthopaedic Surgery Operative Note (CSN: [id number] ) Date of Surgery: 03/21/2018  Admit Date: 03/21/2018   Diagnoses: Pre-Op Diagnoses: Left supracondylar distal humerus fracture with intracondylar extension  Post-Op Diagnosis: Same  Procedures: 1. CPT 24546-Open reduction internal fixation of left distal humerus fracture 2. CPT 25360-Olecranon osteotomy  Surgeons : Primary: Shona Needles, MD Assisting: Charlotte Crumb, MD  Assistant: Patrecia Pace, PA-C  Location:OR 3   Anesthesia:General with regional anesthesia   Antibiotics: Ancef 2g preop  Tourniquet time:None used  Estimated Blood KKXF:81 mL  Complications:None  Specimens:None   Implants: Implant Name Type Inv. Item Serial No. Manufacturer Lot No. LRB No. Used Action  SCREW CANN [id number] - [id number] Screw SCREW CANN [id number]  [employer]  Left 1 Implanted  WASHER FOR 5.0 SCREWS - [id number] Washer WASHER FOR 5.0 SCREWS  [employer]  Left 1 Implanted  PLATE HUMERUS 4H LT 6.1/4.4 - [id number] Plate PLATE HUMERUS 4H LT 6.1/9.5  [employer]  Left 1 Implanted  SCREW CORTEX 2.7 SLF-TPNG 18MM - [id number] Screw SCREW CORTEX 2.7 SLF-TPNG 18MM  [employer]  Left 1 Implanted  SCREW CORTEX 2.7X30 - [id number] Screw SCREW CORTEX 2.7X30  [employer]  Left 1 Implanted  SCREW CORTEX 2.7X50 SLF TPNG - [id number] Screw SCREW CORTEX 2.7X50 SLF TPNG  [employer]  Left 1 Implanted  SCREW LOCKING 2.7X14MM - [id number] Screw SCREW LOCKING 2.7X14MM  [employer]  Left 1 Implanted  SCREW LOCKING 2.7X18MM - [id number] Screw SCREW LOCKING 2.7X18MM  [employer]  Left 1 Implanted  SCREW LOCKING 2.7X22MM - [id number] Screw SCREW LOCKING 2.7X22MM  [employer]  Left 1 Implanted  SCREW LOCKING VA 2.7X50MM - [id number] Screw SCREW LOCKING VA 2.7X50MM  [employer]  Left 1 Implanted  SCREW LOCKING 2.7X28 - [id number] Screw SCREW LOCKING 2.7X28  [employer]  Left 1 Implanted  SCREW LOCKING 2.7X26 - [id number] Screw SCREW  LOCKING 2.7X26  [employer]  Left 1 Implanted  2.22mm screw 58mm     Synthes  Left 1 Implanted  1 hole medial plate    Synthes  Left 1 Implanted  2.60mm locking screw 48mm    Synthes  Left 1 Implanted  SCREW METAPHYSEAL 2.7X28 - [id number] Screw SCREW METAPHYSEAL 2.7X28  [employer]  Left 1 Implanted  SCREW METAPHYSEAL 2.7X32MM - [id number] Screw SCREW METAPHYSEAL 2.7X32MM  SYN[employer]ES TRAUMA  Left 1 Implanted    Indications for Surgery: 48 year old female otherwise healthy who sustained a fall at work.  Had a displaced supracondylar distal humerus fracture with intercondylar extension.  Due to her young age and activity level I felt that she would be best treated with open reduction internal fixation.  Risks and benefits were discussed with the patient.  I likely would perform an olecranon osteotomy.  Risks included but not limited to bleeding, infection, malunion, nonunion, hardware failure, posttraumatic arthritis, stiffness, nerve or blood vessel injury, specifically ulnar nerve, possibility of DVT and even loss of limb.  She agreed to proceed with surgery and consent was obtained.  Operative Findings: 1.  Supracondylar distal humerus fracture with single intercondylar split treated with open reduction internal fixation using olecranon osteotomy. 2.  Internal fixation performed with posterior lateral Synthes and straight medial VA elbow locking plates 3.  Osteotomy fixed with 6.5 mm cannulated screw with a washer size 125 mm  Procedure: The patient was identified in the preoperative holding area. Consent was confirmed with the patient and their family and all questions were answered. The operative extremity was marked after confirmation with  the patient. They were then brought back to the operating room by our anesthesia colleagues. They were placed under general anesthesia and carefully transferred over to a radiolucent flat top table. They were then placed in the lateral decubitus position with  the operative extremity up. A bean bag was used to secure them in this position. An axillary roll was used to free the axilla from pressure. A radiolucent arm board was used to position the operative extremity. The splint was taken down and fluoroscopic films were taken to again characterize the injury pattern. The arm was then prepped and draped in usual sterile fashion.  A timeout was performed to verify the patient, the procedure and the extremity. Preoperative antibiotics were dosed.   A standard posterior approach to the distal humerus was made. Subcutaneous fat and triceps fascia was incised along the incision. Medial and lateral skin flaps were mobilized to exposed the entirety of the triceps. Medially the ulnar nerve was dissected out and isolated. A branch to the joint was sacrificed as it was in the way of the reduction. The nerve was mobilized out of the cubital tunnel, all the way to the heads of the FCU. A subperisoteal dissection was carried out underneath the triceps to expose the fracture. The lateral side was exposed in a similar fashion and extended all the way intra-articular until the capitellum was visualized. A subperisoteal dissection was carried out underneath the triceps to expose the fracture on the lateral side as well.   I then performed an olecranon osteotomy.  Using AP and lateral fluoroscopic imaging I placed a 2.8 mm threaded guidepin down the medullary canal of the ulna.  I then split the triceps appropriately to fit a drill and I placed a 125 mm screw down the center of the bone to create a path for the final fixation.  I then remove the screw and the guidepin.  Using fluoroscopy as a guide I then made a reverse chevron osteotomy.  I used an oscillating blade and finished the osteotomy with an osteotome.  The triceps was then reflected back with the olecranon to expose the distal humerus.  Soft tissue was resected to be able to mobilize the distal portion of the  triceps.  The fracture pattern was relatively straightforward.  There is a large posterior lateral fragment that had a good cortical read to the humeral shaft.  This was reduced and clamped together and 2.7 mm cortical screws were placed to lag this together.  These clamps were able to be removed.  We then reduce the intra-articular split with a reduction tenaculum.  We reduce the medial condyle to the humeral shaft and had anatomic reduction.  2.7 mm positional screws were then placed from lateral to medial along the trochlea and another 2.7 mm lag screw was placed from the condyle into the humeral shaft to hold that reduction.  Were then able to remove all clamps.  A posterior lateral plate was then contoured to fit the posterior aspect of the humerus.  It was provisionally pinned in place with K wires.  We then placed 2.7 mm locking screws in the distal articular segment and then placed nonlocking screws total of 3 into the humeral shaft.  A straight medial plate was then contoured and a 3.5 millimeters screw was placed into the humeral shaft.  3 locking screws were placed in the distal segment and another 2 nonlocking screws were placed into the humeral shaft.  The olecranon osteotomy was then reduced and  the 6.5 mm cannulated screw was then placed with a washer and excellent compression was able to be obtained.  Final fluoro images were obtained which showed adequate reduction and length of all screws. A range of motion was performed under fluoroscopy which showed no instability and good range of motion.  There was no gapping of the olecranon osteotomy site. A gram of vancomycin powder was placed in the incision. The fascia was closed with 0 vicryl. The skin was closed with 2-0 vicryl, 3-0 nylon. A mepilex dressing was placed and the arm was wrapped with an ACE wrap. The patient was placed supine and awoken from anesthesia and taken to PACU in stable condition.   Post Op Plan/Instructions: The  patient will be nonweightbearing to the left upper extremity.  She will receive postoperative Ancef.  She will be admitted for observation.  We will order occupational therapy.  She will be allowed for unrestricted range of motion immediately.  I was present and performed the entire surgery.  Patrecia Pace, PA-C did assist me throughout the case. An assistant was necessary given the difficulty in approach, maintenance of reduction and ability to instrument the fracture.   Katha Hamming, MD [facility]

## 2018-03-21 NOTE — Anesthesia Postprocedure Evaluation (Signed)
Anesthesia Post Note  Patient: Sheryl Mathis  Procedure(s) Performed: OPEN REDUCTION INTERNAL FIXATION (ORIF) DISTAL HUMERUS FRACTURE (Left )     Patient location during evaluation: PACU Anesthesia Type: General Level of consciousness: awake and alert Pain management: pain level controlled Vital Signs Assessment: post-procedure vital signs reviewed and stable Respiratory status: spontaneous breathing, nonlabored ventilation, respiratory function stable and patient connected to nasal cannula oxygen Cardiovascular status: blood pressure returned to baseline and stable Postop Assessment: no apparent nausea or vomiting Anesthetic complications: no    Last Vitals:  Vitals:   03/21/18 1245 03/21/18 1259  BP: (!) 150/71 (!) 154/84  Pulse: (!) 55 (!) 52  Resp: (!) 5 (!) 8  Temp:  36.5 C  SpO2: 92% 93%    Last Pain:  Vitals:   03/21/18 1230  TempSrc:   PainSc: 5                  Barnet Glasgow

## 2018-03-21 NOTE — Transfer of Care (Signed)
Immediate Anesthesia Transfer of Care Note  Patient: Sheryl Mathis  Procedure(s) Performed: OPEN REDUCTION INTERNAL FIXATION (ORIF) DISTAL HUMERUS FRACTURE (Left )  Patient Location: PACU  Anesthesia Type:General  Level of Consciousness: awake, patient cooperative and responds to stimulation  Airway & Oxygen Therapy: Patient Spontanous Breathing and Patient connected to face mask oxygen  Post-op Assessment: Report given to RN, Post -op Vital signs reviewed and stable and Patient moving all extremities X 4  Post vital signs: Reviewed and stable  Last Vitals:  Vitals Value Taken Time  BP 156/85 03/21/2018 10:44 AM  Temp    Pulse 71 03/21/2018 10:45 AM  Resp 24 03/21/2018 10:45 AM  SpO2 96 % 03/21/2018 10:45 AM  Vitals shown include unvalidated device data.  Last Pain:  Vitals:   03/21/18 0607  TempSrc: Oral      Patients Stated Pain Goal: 2 (77/41/42 3953)  Complications: No apparent anesthesia complications

## 2018-03-22 DIAGNOSIS — S42492A Other displaced fracture of lower end of left humerus, initial encounter for closed fracture: Secondary | ICD-10-CM | POA: Diagnosis not present

## 2018-03-22 LAB — CBC
HEMATOCRIT: 41.9 % (ref 36.0–46.0)
Hemoglobin: 13.5 g/dL (ref 12.0–15.0)
MCH: 28.1 pg (ref 26.0–34.0)
MCHC: 32.2 g/dL (ref 30.0–36.0)
MCV: 87.3 fL (ref 80.0–100.0)
Platelets: 414 10*3/uL — ABNORMAL HIGH (ref 150–400)
RBC: 4.8 MIL/uL (ref 3.87–5.11)
RDW: 12.5 % (ref 11.5–15.5)
WBC: 14.2 10*3/uL — ABNORMAL HIGH (ref 4.0–10.5)
nRBC: 0 % (ref 0.0–0.2)

## 2018-03-22 NOTE — Discharge Summary (Signed)
Discharge Summary  Patient ID: Sheryl Mathis MRN: [id number] DOB/AGE: [birth year] 48 y.o.  Admit date: 03/21/2018 Discharge date: 03/22/2018  Admission Diagnoses:  Closed fracture of left distal humerus  Discharge Diagnoses:  Principal Problem:   Closed fracture of left distal humerus   Past Medical History:  Diagnosis Date  . Anemia   . Anxiety   . Cancer [facility])    ENDOMETRIAL CANCER (tx completed 05/2013)  . Complication of anesthesia    PT STATES SHE WOKE UP AFTER D&C [place].  STATES NO PROBLEMS WAKING UP AFTER HER HYSTERECTOMY  . Depression   . Fatty liver   . Headache(784.0)    MIGRAINES  . History of radiation therapy 04/23/13, 04/29/13, 05/07/13, 05/14/13, 05/21/13   30 Gy to proximal vagina  . Menorrhagia   . Migraines   . Neuropathy   . PONV (postoperative nausea and vomiting)     Surgeries: Procedure(s): OPEN REDUCTION INTERNAL FIXATION (ORIF) DISTAL HUMERUS FRACTURE on 03/21/2018   Consultants (if any):   Discharged Condition: Improved  Hospital Course: Sheryl Mathis is an 48 y.o. female who slipped and fell on a wet floor and landed on her left arm while at work on 03/16/18.  She was seen in [facility] emergency department where x-rays and CT revealed a left distal humerus fracture. She was placed in a long arm splint and a sling. She was made non-weightbearing on the left upper extremity, was provided pain medication, and was instructed to follow up with orthopedic surgery.  She was admitted 03/21/2018 with a diagnosis of Closed fracture of left distal humerus and went to the operating room on 03/21/2018 and underwent the above named procedures.    She was given perioperative antibiotics:  Anti-infectives (From admission, onward)   Start     Dose/Rate Route Frequency Ordered Stop   03/21/18 1600  ceFAZolin (ANCEF) IVPB 2g/100 mL premix     2 g 200 mL/hr over 30 Minutes Intravenous Every 8 hours 03/21/18 1324 03/22/18 0556    03/21/18 0720  vancomycin (VANCOCIN) powder  Status:  Discontinued       As needed 03/21/18 0720 03/21/18 1036   03/21/18 0630  ceFAZolin (ANCEF) 3 g in dextrose 5 % 50 mL IVPB     3 g 100 mL/hr over 30 Minutes Intravenous To [facility] 03/20/18 1315 03/21/18 0815    .  She was given sequential compression devices, and early ambulation for DVT prophylaxis.  She benefited maximally from the hospital stay and there were no complications.    Recent vital signs:  Vitals:   03/22/18 0042 03/22/18 0424  BP: 132/61 (!) 164/84  Pulse: (!) 50 (!) 58  Resp:    Temp: 98 F (36.7 C) 97.8 F (36.6 C)  SpO2: 96% 99%    Recent laboratory studies:  Lab Results  Component Value Date   HGB 13.5 03/22/2018   HGB 14.6 03/21/2018   HGB 13.4 06/08/2017   Lab Results  Component Value Date   WBC 14.2 (H) 03/22/2018   PLT 414 (H) 03/22/2018   No results found for: INR Lab Results  Component Value Date   NA 137 03/21/2018   K 4.2 03/21/2018   CL 101 03/21/2018   CO2 26 03/21/2018   BUN 11 03/21/2018   CREATININE 0.67 03/21/2018   GLUCOSE 98 03/21/2018    Discharge Medications:   Allergies as of 03/22/2018      Reactions   Rexulti [brexpiprazole]  Other (See Comments)   Acute severe dystonic reaction (neck/ arm spasms, stuttering speech), Jul 2017   Fluoxetine Other (See Comments)   Paranoia, altered mental status   Hydrocodone-acetaminophen Nausea Only   Too strong      Medication List    TAKE these medications   aluminum-magnesium hydroxide-simethicone 254-270-62 MG/5ML Susp Commonly known as:  MAALOX Take 15 mLs by mouth daily as needed (indigestion).   benztropine 1 MG tablet Commonly known as:  COGENTIN Take 1 tablet (1 mg total) by mouth 2 (two) times daily.   clonazePAM 0.5 MG tablet Commonly known as:  KLONOPIN Take 1 tablet (0.5 mg total) by mouth 3 (three) times daily as needed (muscle spasms / twitching / anxiety).   DULoxetine 60 MG capsule Commonly  known as:  CYMBALTA Take 1 capsule (60 mg total) by mouth 2 (two) times daily. For depression   ibuprofen 800 MG tablet Commonly known as:  ADVIL,MOTRIN Take 800 mg by mouth every 6 (six) hours as needed for moderate pain. Take with oxycodone   loratadine 10 MG tablet Commonly known as:  CLARITIN Take 1 tablet (10 mg total) by mouth daily. For allergies What changed:    when to take this  reasons to take this  additional instructions   nicotine 14 mg/24hr patch Commonly known as:  NICODERM CQ - dosed in mg/24 hours Place 14 mg onto the skin daily.   omeprazole 20 MG capsule Commonly known as:  PRILOSEC Take 1 capsule (20 mg total) by mouth daily. For acid reflux   ondansetron 4 MG disintegrating tablet Commonly known as:  ZOFRAN ODT Take 1 tablet (4 mg total) by mouth every 8 (eight) hours as needed for nausea or vomiting.   oxyCODONE-acetaminophen 5-325 MG tablet Commonly known as:  PERCOCET/ROXICET Take 1 tablet by mouth every 6 (six) hours as needed for severe pain.   promethazine 25 MG tablet Commonly known as:  PHENERGAN Take 25 mg by mouth 2 (two) times daily.   topiramate 25 MG tablet Commonly known as:  TOPAMAX Take 2 tablets (50 mg total) by mouth 2 (two) times daily. For migraine headache pain       Diagnostic Studies: Dg Elbow 2 Views Left  Result Date: 03/21/2018 CLINICAL DATA:  Distal left humerus fracture. EXAM: LEFT ELBOW - 2 VIEW COMPARISON:  Radiographs and CT scan dated 03/16/2018 FINDINGS: AP and lateral images demonstrate the patient has undergone open reduction and internal fixation of the comminuted fracture of the distal humerus. Alignment and position of the fracture fragments is near anatomic. Hardware appears in good position. There is also a long screw in the proximal ulna. IMPRESSION: Status post open reduction and internal fixation of left elbow fractures as described Electronically Signed   By: Lorriane Shire M.D.   On: 03/21/2018 11:44    Dg Elbow Complete Left  Result Date: 03/21/2018 CLINICAL DATA:  Intraoperative imaging for fixation a distal left humerus fracture which the patient suffered a slip and fall on a wet floor 03/16/2018. EXAM: DG C-ARM 61-120 MIN; LEFT ELBOW - COMPLETE 3+ VIEW COMPARISON:  Plain films left elbow and CT left elbow 03/16/2018. FINDINGS: We are provided with multiple intraoperative spot views of the left elbow. Images demonstrate plate and screw fixation of a complex bicondylar fracture of the distal humerus. Position and alignment appear anatomic. No acute abnormality is identified. IMPRESSION: Intraoperative imaging for fixation of a distal left humerus fracture. No acute abnormality. Electronically Signed   By: Inge Rise  M.D.   On: 03/21/2018 10:44   Dg C-arm 1-60 Min  Result Date: 03/21/2018 CLINICAL DATA:  Intraoperative imaging for fixation a distal left humerus fracture which the patient suffered a slip and fall on a wet floor 03/16/2018. EXAM: DG C-ARM 61-120 MIN; LEFT ELBOW - COMPLETE 3+ VIEW COMPARISON:  Plain films left elbow and CT left elbow 03/16/2018. FINDINGS: We are provided with multiple intraoperative spot views of the left elbow. Images demonstrate plate and screw fixation of a complex bicondylar fracture of the distal humerus. Position and alignment appear anatomic. No acute abnormality is identified. IMPRESSION: Intraoperative imaging for fixation of a distal left humerus fracture. No acute abnormality. Electronically Signed   By: Inge Rise M.D.   On: 03/21/2018 10:44    Disposition: Discharge disposition: 01-Home or Self Care       Discharge Instructions    Discharge patient   Complete by:  As directed    Discharge disposition:  01-Home or Self Care   Discharge patient date:  03/22/2018        Signed: Prudencio Burly III PA-C 03/22/2018, 1:50 PM

## 2018-03-22 NOTE — Evaluation (Signed)
Occupational Therapy Evaluation and Discharge Patient Details Name: Sheryl Mathis MRN: [id number] DOB: 12/16/1970 Today's Date: 03/22/2018    History of Present Illness Pt is a 48 y.o. female s/p ORIF L distal humerus fracture and olecranon osteotomy. She has a PMH significant for anemia, anxiety, cancer, depression, headache, migrains, and neuropathy.    Clinical Impression   PTA, pt was independent with ADL and functional mobility. She has good family support to assist her post-acute D/C. She currently requires min assist for UB ADL as well as supervision for LB ADL. Educated pt and family concerning compensatory ADL strategies and LUE gentle HEP to implement post-acute D/C. She plans to begin outpatient therapies per MD recommendation. All further needs will be met in outpatient venue. OT will sign off acutely.    Follow Up Recommendations  Outpatient OT    Equipment Recommendations  None recommended by OT    Recommendations for Other Services       Precautions / Restrictions Precautions Precautions: Other (comment) Precaution Comments: Sling at all times; s/p ORIF L distal humerus Required Braces or Orthoses: Sling Restrictions Weight Bearing Restrictions: Yes      Mobility Bed Mobility Overal bed mobility: Modified Independent                Transfers Overall transfer level: Needs assistance   Transfers: Sit to/from Stand Sit to Stand: Modified independent (Device/Increase time)         General transfer comment: Able to stand without assistance or LOB    Balance Overall balance assessment: No apparent balance deficits (not formally assessed)                                         ADL either performed or assessed with clinical judgement   ADL Overall ADL's : Needs assistance/impaired Eating/Feeding: Set up;Sitting   Grooming: Modified independent;Standing   Upper Body Bathing: Minimal assistance;Sitting;With caregiver independent  assisting   Lower Body Bathing: Supervison/ safety;Sit to/from stand   Upper Body Dressing : Minimal assistance;With caregiver independent assisting;Sitting   Lower Body Dressing: Supervision/safety;Sit to/from stand   Toilet Transfer: Modified Independent;Ambulation   Toileting- Clothing Manipulation and Hygiene: Modified independent;Sit to/from stand       Functional mobility during ADLs: Supervision/safety General ADL Comments: Pt and mother educated concerning compensatory strategies for safe participation in ADL.      Vision Patient Visual Report: No change from baseline Vision Assessment?: No apparent visual deficits     Perception     Praxis      Pertinent Vitals/Pain Pain Assessment: 0-10 Pain Score: 6  Pain Location: L elbow Pain Descriptors / Indicators: Aching;Burning;Sore Pain Intervention(s): Limited activity within patient's tolerance;Monitored during session;Premedicated before session     Hand Dominance Right   Extremity/Trunk Assessment Upper Extremity Assessment Upper Extremity Assessment: LUE deficits/detail LUE Deficits / Details: L UE tightly in ace wrap. Able to achieve elbow PROM approximately 20 degree arc around 90 degree flexed position (70-110 degrees). She is able to approximate a fist but limited by ACE bandage.   Lower Extremity Assessment Lower Extremity Assessment: Overall WFL for tasks assessed       Communication Communication Communication: No difficulties   Cognition Arousal/Alertness: Awake/alert Behavior During Therapy: WFL for tasks assessed/performed Overall Cognitive Status: Within Functional Limits for tasks assessed  General Comments  Demonstrates good understanding of precautions.     Exercises Exercises: General Upper Extremity;Other exercises General Exercises - Upper Extremity Shoulder Flexion: AROM;Left;10 reps Shoulder Horizontal ABduction: AROM;Left;10  reps Elbow Flexion: PROM;Left;10 reps(70-110 degree arc) Elbow Extension: PROM;Left;10 reps(70-110 degree arc) Wrist Flexion: AROM;Left;10 reps Digit Composite Flexion: AROM;Left;10 reps Composite Extension: AROM;Left;10 reps Other Exercises Other Exercises: Digit opposition (limited by ACE wrap)   Shoulder Instructions      Home Living Family/patient expects to be discharged to:: Private residence Living Arrangements: Spouse/significant other Available Help at Discharge: Family;Available 24 hours/day(adoptive mother; husband) Type of Home: House             Bathroom Shower/Tub: Teacher, early years/pre: Standard     Home Equipment: None          Prior Functioning/Environment Level of Independence: Independent        Comments: Working when injury occurred        OT Problem List: Decreased strength;Decreased range of motion;Decreased activity tolerance;Impaired balance (sitting and/or standing);Decreased safety awareness;Decreased knowledge of use of DME or AE;Decreased knowledge of precautions;Pain      OT Treatment/Interventions:      OT Goals(Current goals can be found in the care plan section) Acute Rehab OT Goals Patient Stated Goal: to get back home OT Goal Formulation: With patient/family  OT Frequency:     Barriers to D/C:            Co-evaluation              AM-PAC OT "6 Clicks" Daily Activity     Outcome Measure Help from another person eating meals?: None Help from another person taking care of personal grooming?: None Help from another person toileting, which includes using toliet, bedpan, or urinal?: None Help from another person bathing (including washing, rinsing, drying)?: A Little Help from another person to put on and taking off regular upper body clothing?: A Little Help from another person to put on and taking off regular lower body clothing?: None 6 Click Score: 22   End of Session Equipment Utilized During  Treatment: (L sling) Nurse Communication: Mobility status  Activity Tolerance: Patient tolerated treatment well Patient left: Other (comment);with family/visitor present(ambulating in room)  OT Visit Diagnosis: Pain Pain - Right/Left: Left Pain - part of body: Arm                Time: 5929-2446 OT Time Calculation (min): 22 min Charges:  OT General Charges $OT Visit: 1 Visit OT Evaluation $OT Eval Moderate Complexity: [place] A Mercy Leppla 03/22/2018, 9:23 AM

## 2018-03-22 NOTE — Progress Notes (Signed)
    Subjective: Patient reports pain as mild, controlled.  Tolerating diet.  Urinating.   No CP, SOB.  Mobilizing  Objective:   VITALS:   Vitals:   03/21/18 1340 03/21/18 2006 03/22/18 0042 03/22/18 0424  BP: (!) 159/85 126/60 132/61 (!) 164/84  Pulse: 62 63 (!) 50 (!) 58  Resp:      Temp: (!) 97.5 F (36.4 C) 98.4 F (36.9 C) 98 F (36.7 C) 97.8 F (36.6 C)  TempSrc: Axillary Oral Oral Oral  SpO2: 100% 96% 96% 99%  Weight:      Height:       CBC Latest Ref Rng & Units 03/22/2018 03/21/2018 06/08/2017  WBC 4.0 - 10.5 K/uL 14.2(H) 8.8 10.1  Hemoglobin 12.0 - 15.0 g/dL 13.5 14.6 13.4  Hematocrit 36.0 - 46.0 % 41.9 45.7 39.4  Platelets 150 - 400 K/uL 414(H) 413(H) 349   BMP Latest Ref Rng & Units 03/21/2018 06/08/2017 07/14/2016  Glucose 70 - 99 mg/dL 98 119(H) 111(H)  BUN 6 - 20 mg/dL 11 21(H) 11  Creatinine 0.44 - 1.00 mg/dL 0.67 0.65 0.66  Sodium 135 - 145 mmol/L 137 136 137  Potassium 3.5 - 5.1 mmol/L 4.2 3.8 4.0  Chloride 98 - 111 mmol/L 101 102 101  CO2 22 - 32 mmol/L 26 26 26   Calcium 8.9 - 10.3 mg/dL 9.5 9.4 9.5   Intake/Output      01/17 0701 - 01/18 0700 01/18 0701 - 01/19 0700   P.O. 200    I.V. (mL/kg) 900 (7.6)    IV Piggyback 100    Total Intake(mL/kg) 1200 (10.2)    Urine (mL/kg/hr) 0 (0)    Blood 50    Total Output 50    Net +1150         Urine Occurrence 3 x       Physical Exam: General: NAD.  Upright in bed eating breakfast.  Calm and conversant.  Mom at bedside. Resp: No increased wob Cardio: regular rate and rhythm ABD soft Neurologically intact MSK LUE: Neurovascularly intact Sensation intact distally Intact pulses distally Incision: dressing C/D/I   Assessment: 1 Day Post-Op  S/P Procedure(s) (LRB): OPEN REDUCTION INTERNAL FIXATION (ORIF) DISTAL HUMERUS FRACTURE (Left) by Dr. Doreatha Martin on 03/21/2018  Principal Problem:   Closed fracture of left distal humerus   Left distal humerus fracture, status post ORIF Doing well postop day  1 Eating, drinking, and voiding Pain controlled Desires discharge this morning.  Plan: Home today Maintain sling Dressing can be removed tomorrow, Sunday 03/23/2018 NWB LUE   Sheryl Mathis III, PA-C 03/22/2018, 7:54 AM

## 2018-03-22 NOTE — Progress Notes (Signed)
Patient educated on discharge papers. No complaints or concerns. Patient taken to pick up area by wheelchair and received by family member.

## 2018-03-22 NOTE — Plan of Care (Signed)

## 2018-03-25 ENCOUNTER — Encounter (HOSPITAL_COMMUNITY): Payer: Self-pay | Admitting: Student

## 2018-03-26 ENCOUNTER — Encounter (HOSPITAL_COMMUNITY): Payer: Self-pay | Admitting: Student

## 2018-03-27 ENCOUNTER — Encounter (HOSPITAL_COMMUNITY): Payer: Self-pay | Admitting: Student

## 2018-03-28 ENCOUNTER — Encounter (HOSPITAL_COMMUNITY): Payer: Self-pay | Admitting: Student

## 2020-01-31 IMAGING — RF DG ELBOW COMPLETE 3+V*L*
1 series · 11 of 11 positions shown · non-contrast
Comparison: Plain films left elbow and CT left elbow 03/16/2018.

CLINICAL DATA: Intraoperative imaging for fixation a distal left
humerus fracture which the patient suffered a slip and fall on a wet
floor 03/16/2018.

EXAM:
DG C-ARM 61-120 MIN; LEFT ELBOW - COMPLETE 3+ VIEW

[Series 1: run · 11 of 11 slices shown]
[im 1/11]
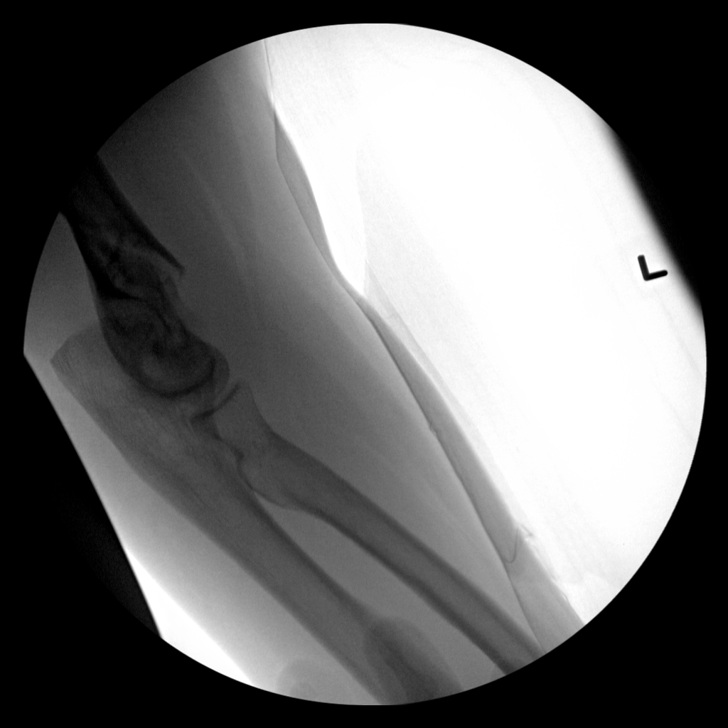
[im 2/11]
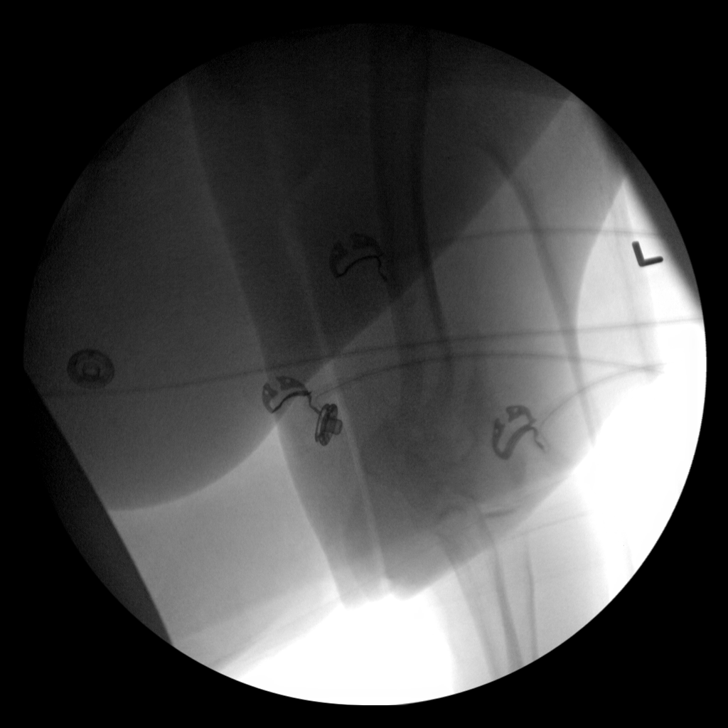
[im 3/11]
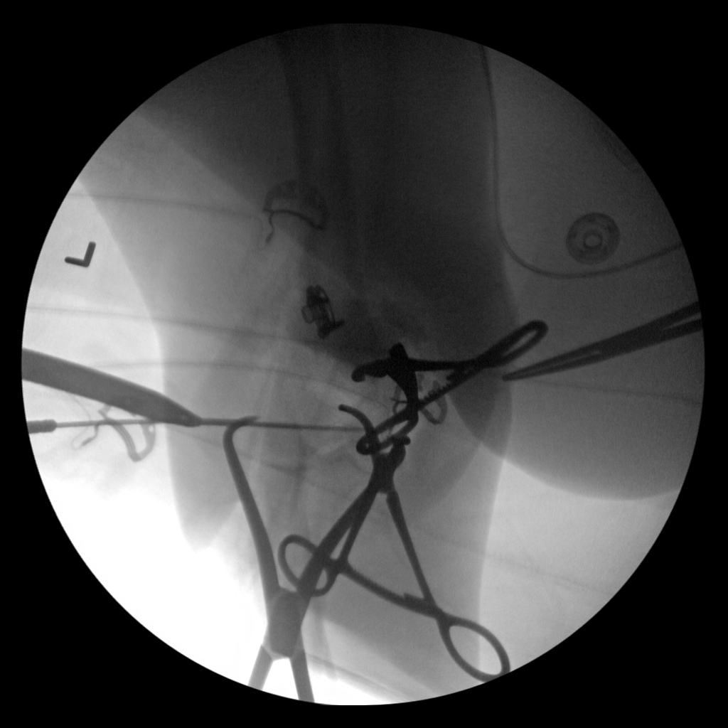
[im 4/11]
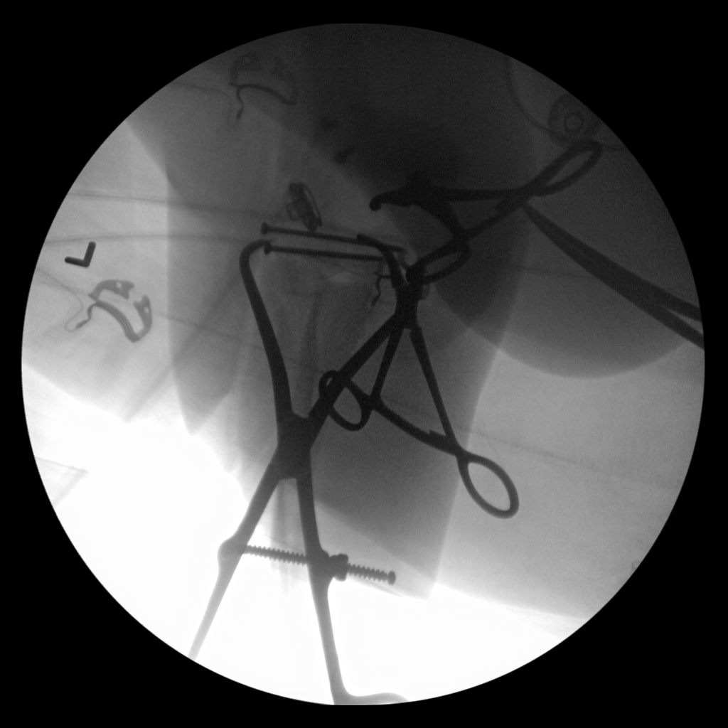
[im 5/11]
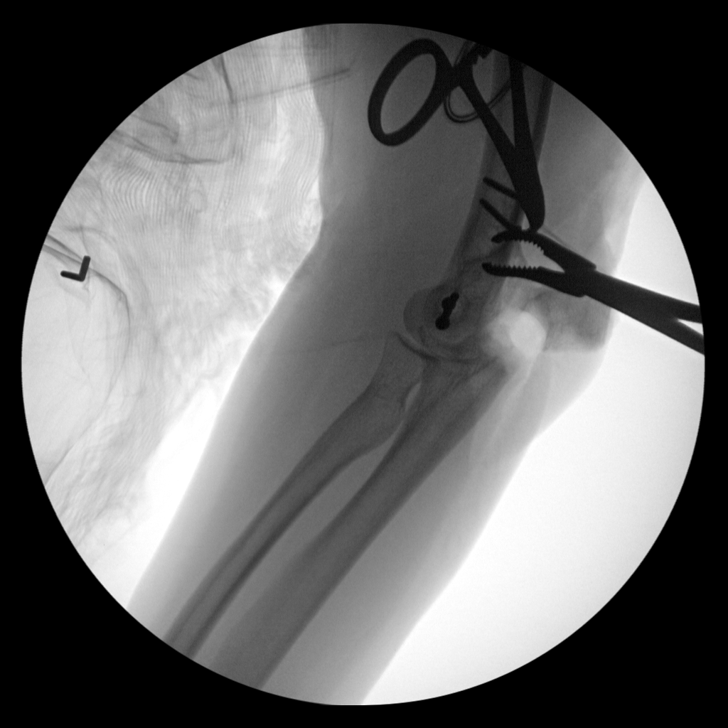
[im 6/11]
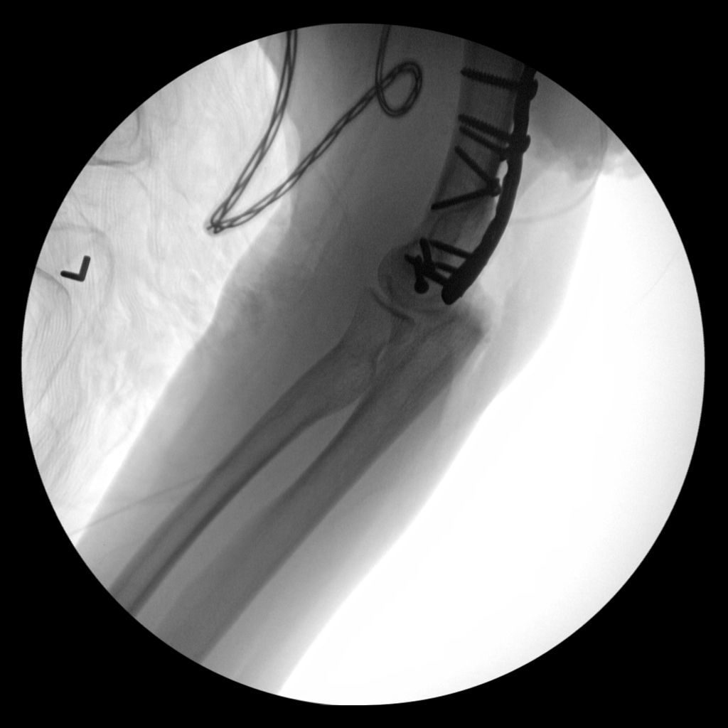
[im 7/11]
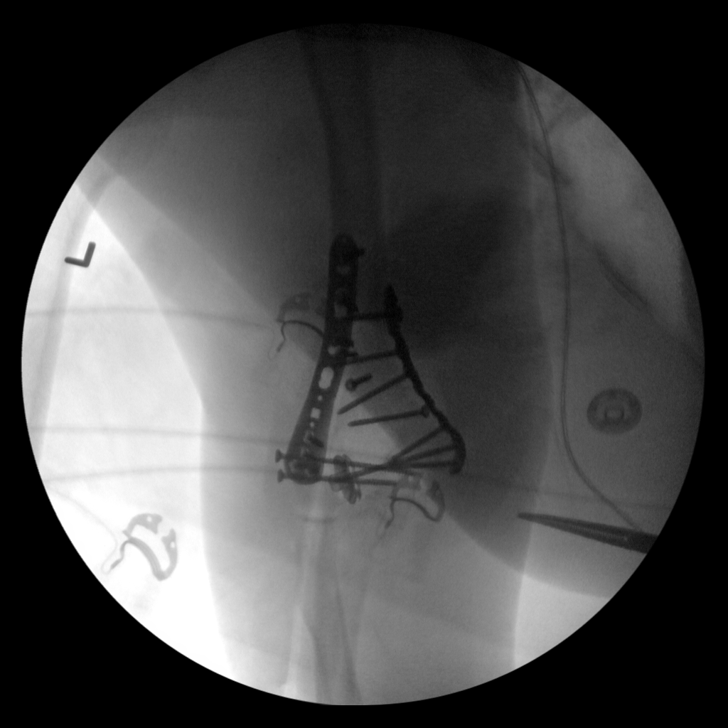
[im 8/11]
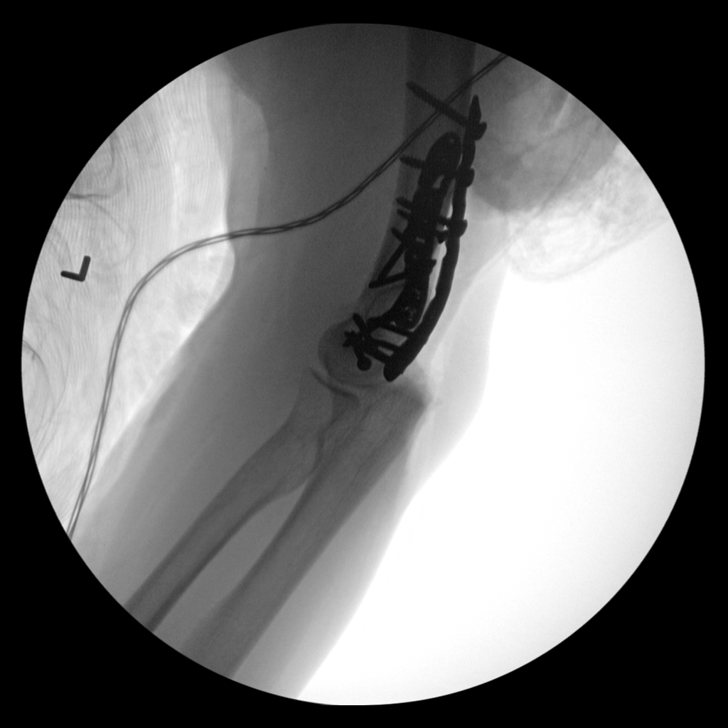
[im 9/11]
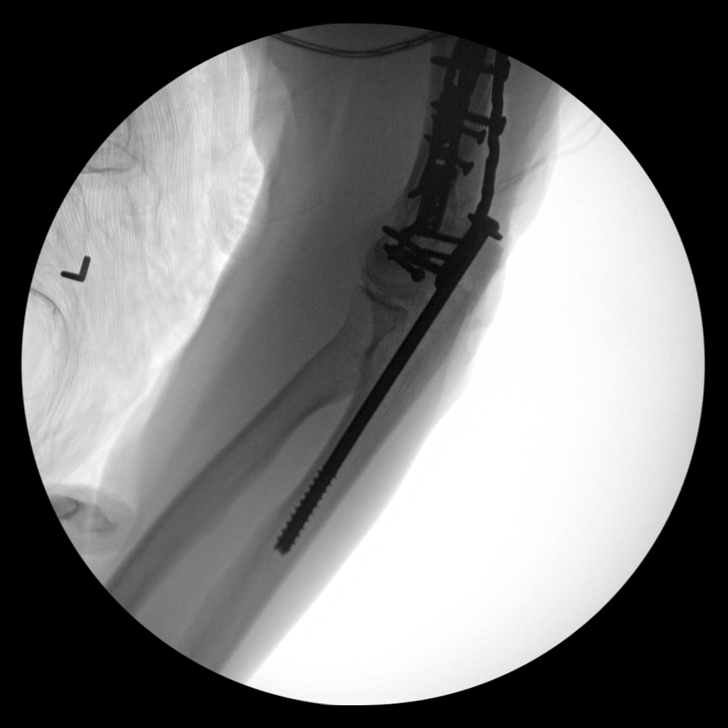
[im 10/11]
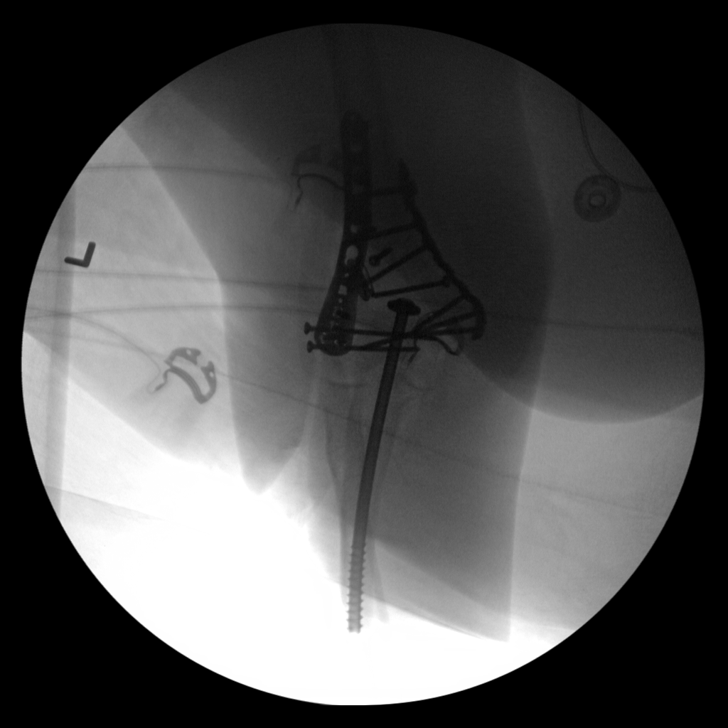
[im 11/11]
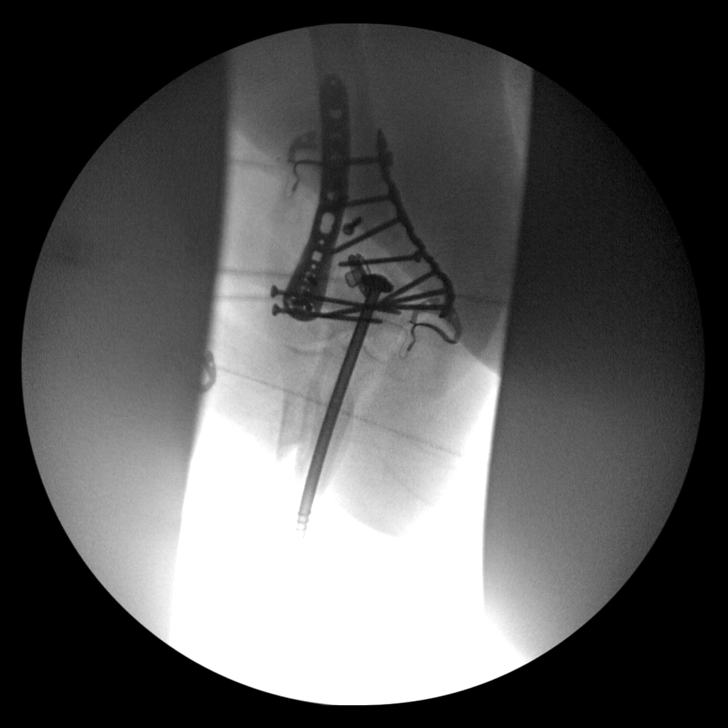

[11 of 11 positions shown; findings below may reference images not displayed]

FINDINGS: We are provided with multiple intraoperative spot views of the left
elbow. Images demonstrate plate and screw fixation of a complex
bicondylar fracture of the distal humerus. Position and alignment
appear anatomic. No acute abnormality is identified.
IMPRESSION: Intraoperative imaging for fixation of a distal left humerus
fracture. No acute abnormality.

## 2020-01-31 IMAGING — DX DG ELBOW 2V*L*
2 series · 2 of 2 positions shown · non-contrast
Comparison: Radiographs and CT scan dated 03/16/2018

CLINICAL DATA: Distal left humerus fracture.

EXAM:
LEFT ELBOW - 2 VIEW

[elbow ap]
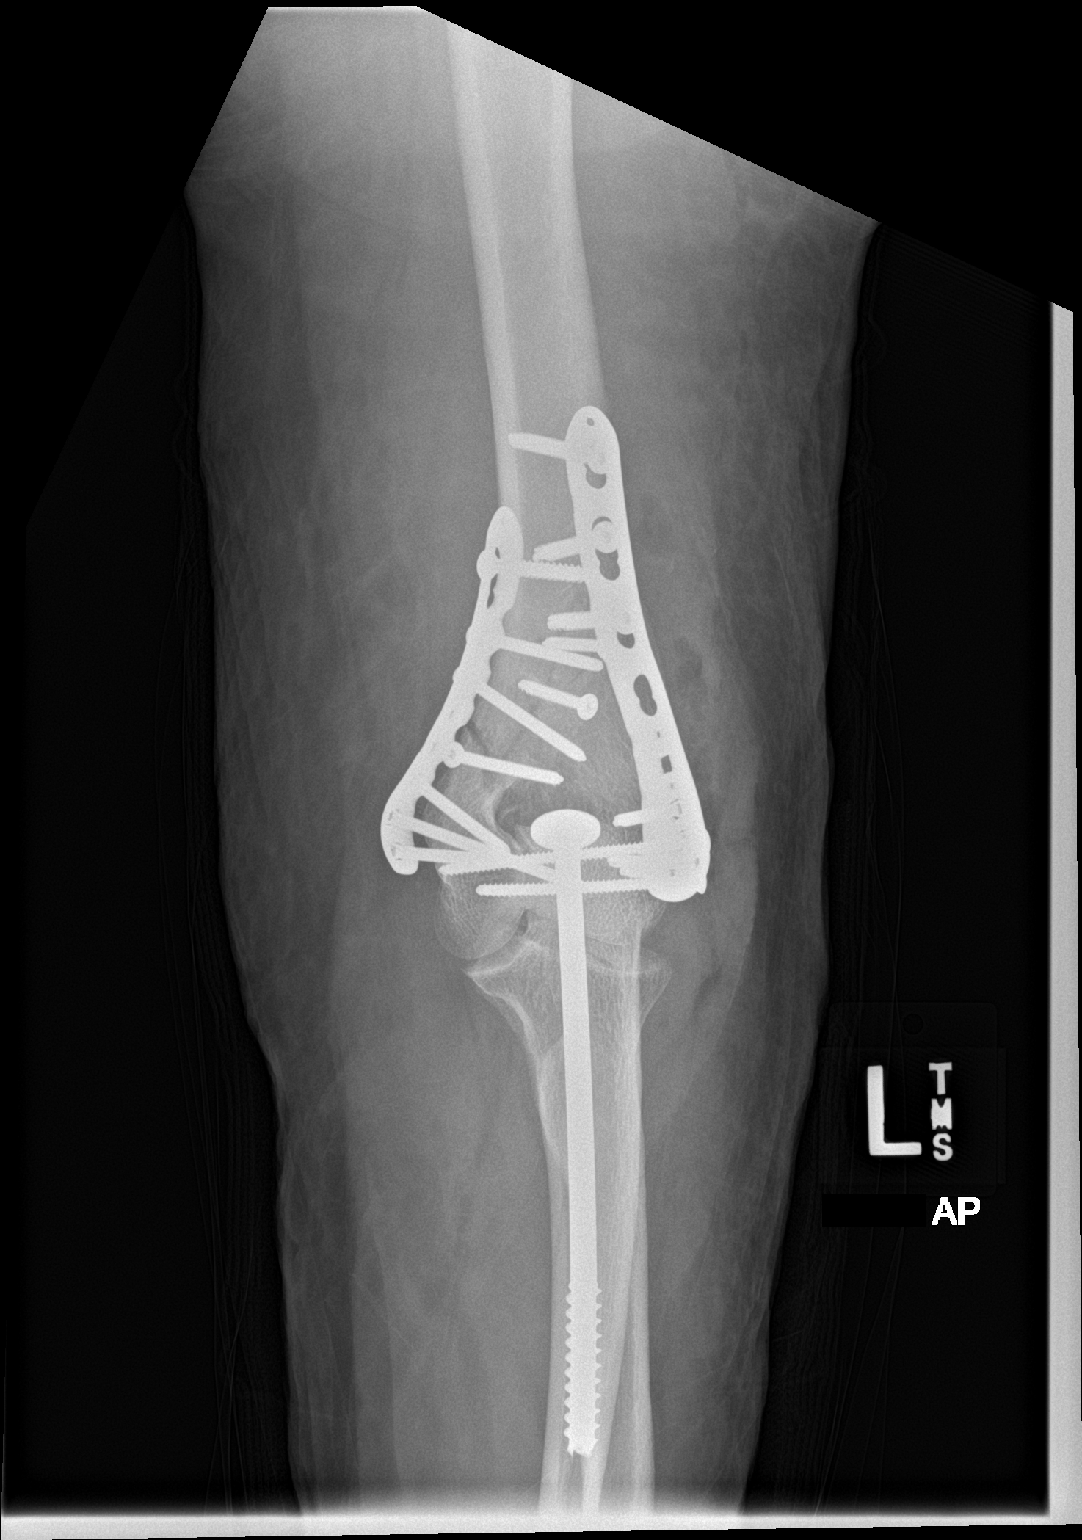

[elbow lat]
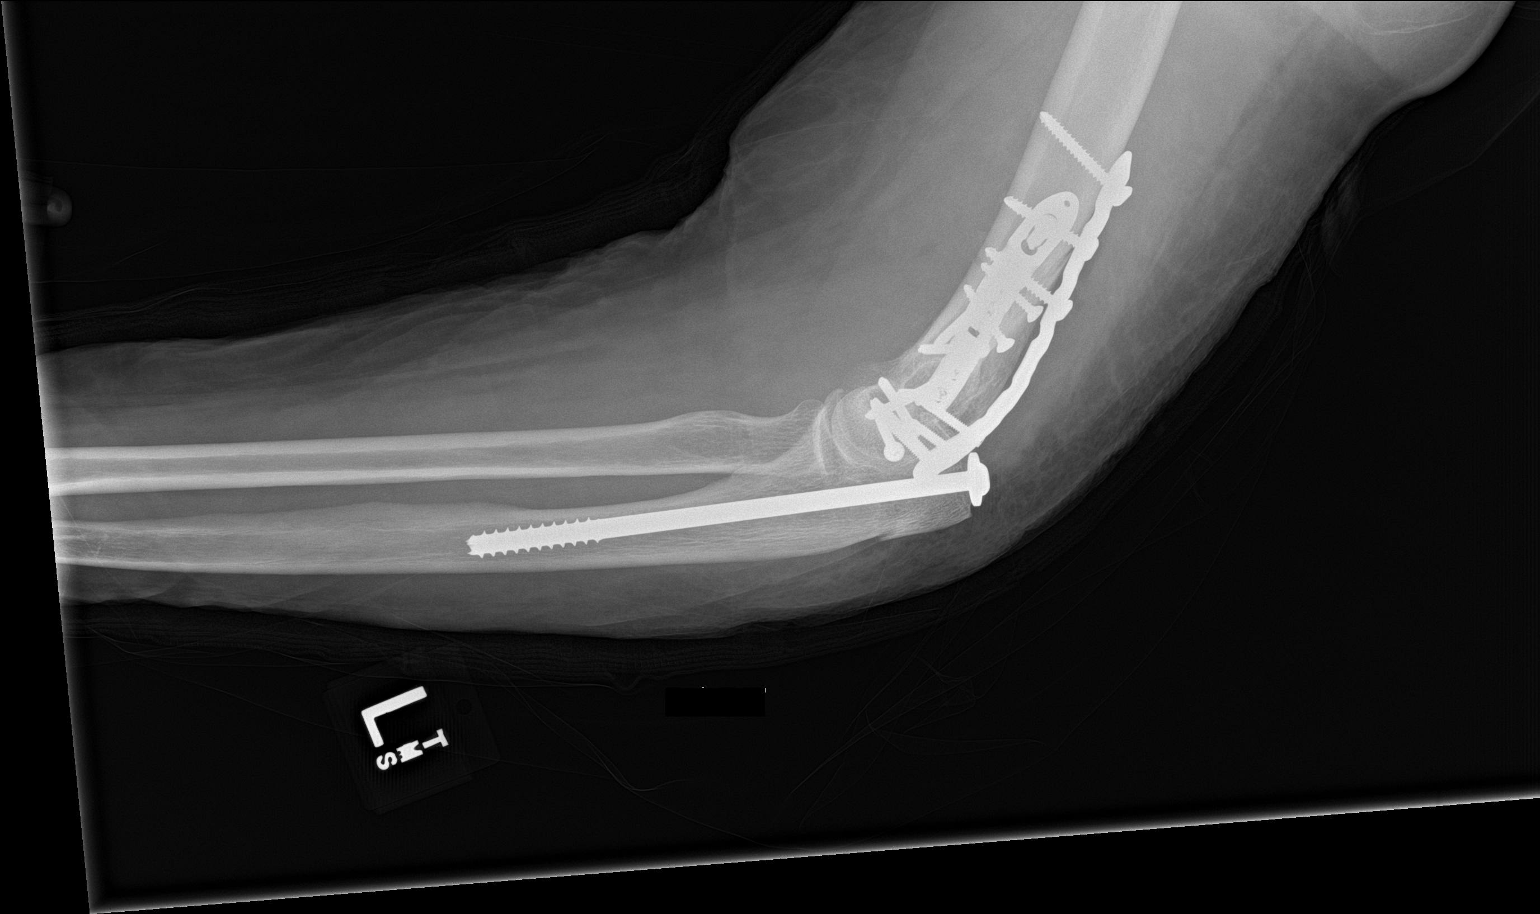

[2 of 2 positions shown; findings below may reference images not displayed]

FINDINGS: AP and lateral images demonstrate the patient has undergone open
reduction and internal fixation of the comminuted fracture of the
distal humerus. Alignment and position of the fracture fragments is
near anatomic. Hardware appears in good position. There is also a
long screw in the proximal ulna.
IMPRESSION: Status post open reduction and internal fixation of left elbow
fractures as described

## 2022-01-01 DIAGNOSIS — Z0000 Encounter for general adult medical examination without abnormal findings: Secondary | ICD-10-CM | POA: Diagnosis not present

## 2022-01-01 DIAGNOSIS — Z23 Encounter for immunization: Secondary | ICD-10-CM | POA: Diagnosis not present

## 2022-02-19 DIAGNOSIS — Z1231 Encounter for screening mammogram for malignant neoplasm of breast: Secondary | ICD-10-CM | POA: Diagnosis not present

## 2022-08-20 DIAGNOSIS — K635 Polyp of colon: Secondary | ICD-10-CM | POA: Diagnosis not present

## 2022-08-20 DIAGNOSIS — K648 Other hemorrhoids: Secondary | ICD-10-CM | POA: Diagnosis not present

## 2022-08-20 DIAGNOSIS — Z1211 Encounter for screening for malignant neoplasm of colon: Secondary | ICD-10-CM | POA: Diagnosis not present

## 2022-08-20 DIAGNOSIS — D125 Benign neoplasm of sigmoid colon: Secondary | ICD-10-CM | POA: Diagnosis not present

## 2022-08-20 DIAGNOSIS — K5730 Diverticulosis of large intestine without perforation or abscess without bleeding: Secondary | ICD-10-CM | POA: Diagnosis not present

## 2022-12-07 DIAGNOSIS — L732 Hidradenitis suppurativa: Secondary | ICD-10-CM | POA: Diagnosis not present

## 2022-12-07 DIAGNOSIS — L02411 Cutaneous abscess of right axilla: Secondary | ICD-10-CM | POA: Diagnosis not present

## 2022-12-07 DIAGNOSIS — B353 Tinea pedis: Secondary | ICD-10-CM | POA: Diagnosis not present

## 2022-12-07 DIAGNOSIS — L02214 Cutaneous abscess of groin: Secondary | ICD-10-CM | POA: Diagnosis not present

## 2023-02-26 DIAGNOSIS — Z1231 Encounter for screening mammogram for malignant neoplasm of breast: Secondary | ICD-10-CM | POA: Diagnosis not present

## 2023-12-06 DIAGNOSIS — M542 Cervicalgia: Secondary | ICD-10-CM | POA: Diagnosis not present

## 2023-12-11 DIAGNOSIS — M545 Low back pain, unspecified: Secondary | ICD-10-CM | POA: Diagnosis not present

## 2023-12-11 DIAGNOSIS — M9903 Segmental and somatic dysfunction of lumbar region: Secondary | ICD-10-CM | POA: Diagnosis not present

## 2023-12-11 DIAGNOSIS — M542 Cervicalgia: Secondary | ICD-10-CM | POA: Diagnosis not present

## 2023-12-11 DIAGNOSIS — M9905 Segmental and somatic dysfunction of pelvic region: Secondary | ICD-10-CM | POA: Diagnosis not present

## 2023-12-11 DIAGNOSIS — M9902 Segmental and somatic dysfunction of thoracic region: Secondary | ICD-10-CM | POA: Diagnosis not present

## 2023-12-11 DIAGNOSIS — M9901 Segmental and somatic dysfunction of cervical region: Secondary | ICD-10-CM | POA: Diagnosis not present

## 2023-12-11 DIAGNOSIS — M25512 Pain in left shoulder: Secondary | ICD-10-CM | POA: Diagnosis not present

## 2023-12-11 DIAGNOSIS — M9904 Segmental and somatic dysfunction of sacral region: Secondary | ICD-10-CM | POA: Diagnosis not present

## 2023-12-12 DIAGNOSIS — M9901 Segmental and somatic dysfunction of cervical region: Secondary | ICD-10-CM | POA: Diagnosis not present

## 2023-12-12 DIAGNOSIS — M545 Low back pain, unspecified: Secondary | ICD-10-CM | POA: Diagnosis not present

## 2023-12-12 DIAGNOSIS — M25512 Pain in left shoulder: Secondary | ICD-10-CM | POA: Diagnosis not present

## 2023-12-12 DIAGNOSIS — M9904 Segmental and somatic dysfunction of sacral region: Secondary | ICD-10-CM | POA: Diagnosis not present

## 2023-12-12 DIAGNOSIS — M9903 Segmental and somatic dysfunction of lumbar region: Secondary | ICD-10-CM | POA: Diagnosis not present

## 2023-12-12 DIAGNOSIS — M9902 Segmental and somatic dysfunction of thoracic region: Secondary | ICD-10-CM | POA: Diagnosis not present

## 2023-12-12 DIAGNOSIS — M9905 Segmental and somatic dysfunction of pelvic region: Secondary | ICD-10-CM | POA: Diagnosis not present

## 2023-12-12 DIAGNOSIS — M542 Cervicalgia: Secondary | ICD-10-CM | POA: Diagnosis not present

## 2023-12-13 DIAGNOSIS — M545 Low back pain, unspecified: Secondary | ICD-10-CM | POA: Diagnosis not present

## 2023-12-13 DIAGNOSIS — M9901 Segmental and somatic dysfunction of cervical region: Secondary | ICD-10-CM | POA: Diagnosis not present

## 2023-12-13 DIAGNOSIS — M9902 Segmental and somatic dysfunction of thoracic region: Secondary | ICD-10-CM | POA: Diagnosis not present

## 2023-12-13 DIAGNOSIS — M9905 Segmental and somatic dysfunction of pelvic region: Secondary | ICD-10-CM | POA: Diagnosis not present

## 2023-12-13 DIAGNOSIS — M542 Cervicalgia: Secondary | ICD-10-CM | POA: Diagnosis not present

## 2023-12-13 DIAGNOSIS — M9903 Segmental and somatic dysfunction of lumbar region: Secondary | ICD-10-CM | POA: Diagnosis not present

## 2023-12-13 DIAGNOSIS — M25512 Pain in left shoulder: Secondary | ICD-10-CM | POA: Diagnosis not present

## 2023-12-13 DIAGNOSIS — M9904 Segmental and somatic dysfunction of sacral region: Secondary | ICD-10-CM | POA: Diagnosis not present

## 2023-12-16 DIAGNOSIS — M25512 Pain in left shoulder: Secondary | ICD-10-CM | POA: Diagnosis not present

## 2023-12-16 DIAGNOSIS — M545 Low back pain, unspecified: Secondary | ICD-10-CM | POA: Diagnosis not present

## 2023-12-16 DIAGNOSIS — M9905 Segmental and somatic dysfunction of pelvic region: Secondary | ICD-10-CM | POA: Diagnosis not present

## 2023-12-16 DIAGNOSIS — M9902 Segmental and somatic dysfunction of thoracic region: Secondary | ICD-10-CM | POA: Diagnosis not present

## 2023-12-16 DIAGNOSIS — M542 Cervicalgia: Secondary | ICD-10-CM | POA: Diagnosis not present

## 2023-12-16 DIAGNOSIS — M9904 Segmental and somatic dysfunction of sacral region: Secondary | ICD-10-CM | POA: Diagnosis not present

## 2023-12-16 DIAGNOSIS — M9903 Segmental and somatic dysfunction of lumbar region: Secondary | ICD-10-CM | POA: Diagnosis not present

## 2023-12-16 DIAGNOSIS — M9901 Segmental and somatic dysfunction of cervical region: Secondary | ICD-10-CM | POA: Diagnosis not present

## 2023-12-17 DIAGNOSIS — M542 Cervicalgia: Secondary | ICD-10-CM | POA: Diagnosis not present

## 2023-12-17 DIAGNOSIS — M25512 Pain in left shoulder: Secondary | ICD-10-CM | POA: Diagnosis not present

## 2023-12-17 DIAGNOSIS — M9903 Segmental and somatic dysfunction of lumbar region: Secondary | ICD-10-CM | POA: Diagnosis not present

## 2023-12-17 DIAGNOSIS — M545 Low back pain, unspecified: Secondary | ICD-10-CM | POA: Diagnosis not present

## 2023-12-17 DIAGNOSIS — M9902 Segmental and somatic dysfunction of thoracic region: Secondary | ICD-10-CM | POA: Diagnosis not present

## 2023-12-17 DIAGNOSIS — M9901 Segmental and somatic dysfunction of cervical region: Secondary | ICD-10-CM | POA: Diagnosis not present

## 2023-12-17 DIAGNOSIS — M9904 Segmental and somatic dysfunction of sacral region: Secondary | ICD-10-CM | POA: Diagnosis not present

## 2023-12-17 DIAGNOSIS — M9905 Segmental and somatic dysfunction of pelvic region: Secondary | ICD-10-CM | POA: Diagnosis not present

## 2023-12-20 DIAGNOSIS — M545 Low back pain, unspecified: Secondary | ICD-10-CM | POA: Diagnosis not present

## 2023-12-20 DIAGNOSIS — M9901 Segmental and somatic dysfunction of cervical region: Secondary | ICD-10-CM | POA: Diagnosis not present

## 2023-12-20 DIAGNOSIS — M9902 Segmental and somatic dysfunction of thoracic region: Secondary | ICD-10-CM | POA: Diagnosis not present

## 2023-12-20 DIAGNOSIS — M9903 Segmental and somatic dysfunction of lumbar region: Secondary | ICD-10-CM | POA: Diagnosis not present

## 2023-12-20 DIAGNOSIS — M25512 Pain in left shoulder: Secondary | ICD-10-CM | POA: Diagnosis not present

## 2023-12-20 DIAGNOSIS — M9905 Segmental and somatic dysfunction of pelvic region: Secondary | ICD-10-CM | POA: Diagnosis not present

## 2023-12-20 DIAGNOSIS — M542 Cervicalgia: Secondary | ICD-10-CM | POA: Diagnosis not present

## 2023-12-20 DIAGNOSIS — M9904 Segmental and somatic dysfunction of sacral region: Secondary | ICD-10-CM | POA: Diagnosis not present

## 2023-12-23 DIAGNOSIS — M9901 Segmental and somatic dysfunction of cervical region: Secondary | ICD-10-CM | POA: Diagnosis not present

## 2023-12-23 DIAGNOSIS — M25512 Pain in left shoulder: Secondary | ICD-10-CM | POA: Diagnosis not present

## 2023-12-23 DIAGNOSIS — M9902 Segmental and somatic dysfunction of thoracic region: Secondary | ICD-10-CM | POA: Diagnosis not present

## 2023-12-23 DIAGNOSIS — M542 Cervicalgia: Secondary | ICD-10-CM | POA: Diagnosis not present

## 2023-12-23 DIAGNOSIS — M545 Low back pain, unspecified: Secondary | ICD-10-CM | POA: Diagnosis not present

## 2023-12-23 DIAGNOSIS — M9903 Segmental and somatic dysfunction of lumbar region: Secondary | ICD-10-CM | POA: Diagnosis not present

## 2023-12-23 DIAGNOSIS — M9905 Segmental and somatic dysfunction of pelvic region: Secondary | ICD-10-CM | POA: Diagnosis not present

## 2023-12-23 DIAGNOSIS — M9904 Segmental and somatic dysfunction of sacral region: Secondary | ICD-10-CM | POA: Diagnosis not present

## 2023-12-24 DIAGNOSIS — M25512 Pain in left shoulder: Secondary | ICD-10-CM | POA: Diagnosis not present

## 2023-12-24 DIAGNOSIS — M545 Low back pain, unspecified: Secondary | ICD-10-CM | POA: Diagnosis not present

## 2023-12-24 DIAGNOSIS — L732 Hidradenitis suppurativa: Secondary | ICD-10-CM | POA: Diagnosis not present

## 2023-12-24 DIAGNOSIS — M9904 Segmental and somatic dysfunction of sacral region: Secondary | ICD-10-CM | POA: Diagnosis not present

## 2023-12-24 DIAGNOSIS — M9903 Segmental and somatic dysfunction of lumbar region: Secondary | ICD-10-CM | POA: Diagnosis not present

## 2023-12-24 DIAGNOSIS — M9902 Segmental and somatic dysfunction of thoracic region: Secondary | ICD-10-CM | POA: Diagnosis not present

## 2023-12-24 DIAGNOSIS — M9905 Segmental and somatic dysfunction of pelvic region: Secondary | ICD-10-CM | POA: Diagnosis not present

## 2023-12-24 DIAGNOSIS — M9901 Segmental and somatic dysfunction of cervical region: Secondary | ICD-10-CM | POA: Diagnosis not present

## 2023-12-24 DIAGNOSIS — M542 Cervicalgia: Secondary | ICD-10-CM | POA: Diagnosis not present

## 2023-12-24 DIAGNOSIS — M25511 Pain in right shoulder: Secondary | ICD-10-CM | POA: Diagnosis not present

## 2023-12-24 DIAGNOSIS — K047 Periapical abscess without sinus: Secondary | ICD-10-CM | POA: Diagnosis not present

## 2023-12-27 DIAGNOSIS — M25512 Pain in left shoulder: Secondary | ICD-10-CM | POA: Diagnosis not present

## 2023-12-27 DIAGNOSIS — M9901 Segmental and somatic dysfunction of cervical region: Secondary | ICD-10-CM | POA: Diagnosis not present

## 2023-12-27 DIAGNOSIS — M9905 Segmental and somatic dysfunction of pelvic region: Secondary | ICD-10-CM | POA: Diagnosis not present

## 2023-12-27 DIAGNOSIS — M9904 Segmental and somatic dysfunction of sacral region: Secondary | ICD-10-CM | POA: Diagnosis not present

## 2023-12-27 DIAGNOSIS — M9903 Segmental and somatic dysfunction of lumbar region: Secondary | ICD-10-CM | POA: Diagnosis not present

## 2023-12-27 DIAGNOSIS — M9902 Segmental and somatic dysfunction of thoracic region: Secondary | ICD-10-CM | POA: Diagnosis not present

## 2023-12-27 DIAGNOSIS — M542 Cervicalgia: Secondary | ICD-10-CM | POA: Diagnosis not present

## 2023-12-27 DIAGNOSIS — M545 Low back pain, unspecified: Secondary | ICD-10-CM | POA: Diagnosis not present

## 2023-12-31 DIAGNOSIS — M9902 Segmental and somatic dysfunction of thoracic region: Secondary | ICD-10-CM | POA: Diagnosis not present

## 2023-12-31 DIAGNOSIS — M9903 Segmental and somatic dysfunction of lumbar region: Secondary | ICD-10-CM | POA: Diagnosis not present

## 2023-12-31 DIAGNOSIS — M545 Low back pain, unspecified: Secondary | ICD-10-CM | POA: Diagnosis not present

## 2023-12-31 DIAGNOSIS — M9905 Segmental and somatic dysfunction of pelvic region: Secondary | ICD-10-CM | POA: Diagnosis not present

## 2023-12-31 DIAGNOSIS — M25512 Pain in left shoulder: Secondary | ICD-10-CM | POA: Diagnosis not present

## 2023-12-31 DIAGNOSIS — M9904 Segmental and somatic dysfunction of sacral region: Secondary | ICD-10-CM | POA: Diagnosis not present

## 2023-12-31 DIAGNOSIS — M542 Cervicalgia: Secondary | ICD-10-CM | POA: Diagnosis not present

## 2023-12-31 DIAGNOSIS — M9901 Segmental and somatic dysfunction of cervical region: Secondary | ICD-10-CM | POA: Diagnosis not present

## 2024-01-02 DIAGNOSIS — M9905 Segmental and somatic dysfunction of pelvic region: Secondary | ICD-10-CM | POA: Diagnosis not present

## 2024-01-02 DIAGNOSIS — M25512 Pain in left shoulder: Secondary | ICD-10-CM | POA: Diagnosis not present

## 2024-01-02 DIAGNOSIS — M545 Low back pain, unspecified: Secondary | ICD-10-CM | POA: Diagnosis not present

## 2024-01-02 DIAGNOSIS — M9904 Segmental and somatic dysfunction of sacral region: Secondary | ICD-10-CM | POA: Diagnosis not present

## 2024-01-02 DIAGNOSIS — M542 Cervicalgia: Secondary | ICD-10-CM | POA: Diagnosis not present

## 2024-01-02 DIAGNOSIS — M9903 Segmental and somatic dysfunction of lumbar region: Secondary | ICD-10-CM | POA: Diagnosis not present

## 2024-01-02 DIAGNOSIS — M9901 Segmental and somatic dysfunction of cervical region: Secondary | ICD-10-CM | POA: Diagnosis not present

## 2024-01-02 DIAGNOSIS — M9902 Segmental and somatic dysfunction of thoracic region: Secondary | ICD-10-CM | POA: Diagnosis not present

## 2024-01-06 DIAGNOSIS — M50322 Other cervical disc degeneration at C5-C6 level: Secondary | ICD-10-CM | POA: Diagnosis not present

## 2024-01-06 DIAGNOSIS — M436 Torticollis: Secondary | ICD-10-CM | POA: Diagnosis not present

## 2024-01-06 DIAGNOSIS — R531 Weakness: Secondary | ICD-10-CM | POA: Diagnosis not present

## 2024-01-06 DIAGNOSIS — Z8542 Personal history of malignant neoplasm of other parts of uterus: Secondary | ICD-10-CM | POA: Diagnosis not present

## 2024-01-06 DIAGNOSIS — F419 Anxiety disorder, unspecified: Secondary | ICD-10-CM | POA: Diagnosis not present

## 2024-01-06 DIAGNOSIS — M50323 Other cervical disc degeneration at C6-C7 level: Secondary | ICD-10-CM | POA: Diagnosis not present

## 2024-01-06 DIAGNOSIS — M25511 Pain in right shoulder: Secondary | ICD-10-CM | POA: Diagnosis not present

## 2024-01-06 DIAGNOSIS — M50321 Other cervical disc degeneration at C4-C5 level: Secondary | ICD-10-CM | POA: Diagnosis not present

## 2024-01-06 DIAGNOSIS — Z9889 Other specified postprocedural states: Secondary | ICD-10-CM | POA: Diagnosis not present

## 2024-01-06 DIAGNOSIS — K219 Gastro-esophageal reflux disease without esophagitis: Secondary | ICD-10-CM | POA: Diagnosis not present

## 2024-01-06 DIAGNOSIS — F32A Depression, unspecified: Secondary | ICD-10-CM | POA: Diagnosis not present

## 2024-01-06 DIAGNOSIS — M19011 Primary osteoarthritis, right shoulder: Secondary | ICD-10-CM | POA: Diagnosis not present

## 2024-01-07 DIAGNOSIS — M25512 Pain in left shoulder: Secondary | ICD-10-CM | POA: Diagnosis not present

## 2024-01-07 DIAGNOSIS — M545 Low back pain, unspecified: Secondary | ICD-10-CM | POA: Diagnosis not present

## 2024-01-07 DIAGNOSIS — M542 Cervicalgia: Secondary | ICD-10-CM | POA: Diagnosis not present

## 2024-01-07 DIAGNOSIS — M9905 Segmental and somatic dysfunction of pelvic region: Secondary | ICD-10-CM | POA: Diagnosis not present

## 2024-01-07 DIAGNOSIS — M9902 Segmental and somatic dysfunction of thoracic region: Secondary | ICD-10-CM | POA: Diagnosis not present

## 2024-01-07 DIAGNOSIS — M9904 Segmental and somatic dysfunction of sacral region: Secondary | ICD-10-CM | POA: Diagnosis not present

## 2024-01-07 DIAGNOSIS — M9901 Segmental and somatic dysfunction of cervical region: Secondary | ICD-10-CM | POA: Diagnosis not present

## 2024-01-07 DIAGNOSIS — M9903 Segmental and somatic dysfunction of lumbar region: Secondary | ICD-10-CM | POA: Diagnosis not present

## 2024-01-10 DIAGNOSIS — M545 Low back pain, unspecified: Secondary | ICD-10-CM | POA: Diagnosis not present

## 2024-01-10 DIAGNOSIS — M25512 Pain in left shoulder: Secondary | ICD-10-CM | POA: Diagnosis not present

## 2024-01-10 DIAGNOSIS — M9905 Segmental and somatic dysfunction of pelvic region: Secondary | ICD-10-CM | POA: Diagnosis not present

## 2024-01-10 DIAGNOSIS — M9903 Segmental and somatic dysfunction of lumbar region: Secondary | ICD-10-CM | POA: Diagnosis not present

## 2024-01-10 DIAGNOSIS — M542 Cervicalgia: Secondary | ICD-10-CM | POA: Diagnosis not present

## 2024-01-10 DIAGNOSIS — M9901 Segmental and somatic dysfunction of cervical region: Secondary | ICD-10-CM | POA: Diagnosis not present

## 2024-01-10 DIAGNOSIS — M9902 Segmental and somatic dysfunction of thoracic region: Secondary | ICD-10-CM | POA: Diagnosis not present

## 2024-01-10 DIAGNOSIS — M9904 Segmental and somatic dysfunction of sacral region: Secondary | ICD-10-CM | POA: Diagnosis not present

## 2024-01-13 DIAGNOSIS — R531 Weakness: Secondary | ICD-10-CM | POA: Diagnosis not present

## 2024-01-13 DIAGNOSIS — Z9889 Other specified postprocedural states: Secondary | ICD-10-CM | POA: Diagnosis not present

## 2024-01-13 DIAGNOSIS — M25511 Pain in right shoulder: Secondary | ICD-10-CM | POA: Diagnosis not present

## 2024-01-13 DIAGNOSIS — F32A Depression, unspecified: Secondary | ICD-10-CM | POA: Diagnosis not present

## 2024-01-13 DIAGNOSIS — M50321 Other cervical disc degeneration at C4-C5 level: Secondary | ICD-10-CM | POA: Diagnosis not present

## 2024-01-13 DIAGNOSIS — M50322 Other cervical disc degeneration at C5-C6 level: Secondary | ICD-10-CM | POA: Diagnosis not present

## 2024-01-13 DIAGNOSIS — M50323 Other cervical disc degeneration at C6-C7 level: Secondary | ICD-10-CM | POA: Diagnosis not present

## 2024-01-13 DIAGNOSIS — K219 Gastro-esophageal reflux disease without esophagitis: Secondary | ICD-10-CM | POA: Diagnosis not present

## 2024-01-13 DIAGNOSIS — M19011 Primary osteoarthritis, right shoulder: Secondary | ICD-10-CM | POA: Diagnosis not present

## 2024-01-13 DIAGNOSIS — F419 Anxiety disorder, unspecified: Secondary | ICD-10-CM | POA: Diagnosis not present

## 2024-01-13 DIAGNOSIS — M436 Torticollis: Secondary | ICD-10-CM | POA: Diagnosis not present

## 2024-01-13 DIAGNOSIS — Z8542 Personal history of malignant neoplasm of other parts of uterus: Secondary | ICD-10-CM | POA: Diagnosis not present

## 2024-01-14 DIAGNOSIS — M545 Low back pain, unspecified: Secondary | ICD-10-CM | POA: Diagnosis not present

## 2024-01-14 DIAGNOSIS — M542 Cervicalgia: Secondary | ICD-10-CM | POA: Diagnosis not present

## 2024-01-14 DIAGNOSIS — M25512 Pain in left shoulder: Secondary | ICD-10-CM | POA: Diagnosis not present

## 2024-01-14 DIAGNOSIS — M9902 Segmental and somatic dysfunction of thoracic region: Secondary | ICD-10-CM | POA: Diagnosis not present

## 2024-01-14 DIAGNOSIS — M9905 Segmental and somatic dysfunction of pelvic region: Secondary | ICD-10-CM | POA: Diagnosis not present

## 2024-01-14 DIAGNOSIS — M9903 Segmental and somatic dysfunction of lumbar region: Secondary | ICD-10-CM | POA: Diagnosis not present

## 2024-01-14 DIAGNOSIS — M9904 Segmental and somatic dysfunction of sacral region: Secondary | ICD-10-CM | POA: Diagnosis not present

## 2024-01-14 DIAGNOSIS — M9901 Segmental and somatic dysfunction of cervical region: Secondary | ICD-10-CM | POA: Diagnosis not present

## 2024-01-20 DIAGNOSIS — F419 Anxiety disorder, unspecified: Secondary | ICD-10-CM | POA: Diagnosis not present

## 2024-01-20 DIAGNOSIS — Z9889 Other specified postprocedural states: Secondary | ICD-10-CM | POA: Diagnosis not present

## 2024-01-20 DIAGNOSIS — F32A Depression, unspecified: Secondary | ICD-10-CM | POA: Diagnosis not present

## 2024-01-20 DIAGNOSIS — R531 Weakness: Secondary | ICD-10-CM | POA: Diagnosis not present

## 2024-01-20 DIAGNOSIS — K219 Gastro-esophageal reflux disease without esophagitis: Secondary | ICD-10-CM | POA: Diagnosis not present

## 2024-01-20 DIAGNOSIS — M50322 Other cervical disc degeneration at C5-C6 level: Secondary | ICD-10-CM | POA: Diagnosis not present

## 2024-01-20 DIAGNOSIS — M19011 Primary osteoarthritis, right shoulder: Secondary | ICD-10-CM | POA: Diagnosis not present

## 2024-01-20 DIAGNOSIS — M50323 Other cervical disc degeneration at C6-C7 level: Secondary | ICD-10-CM | POA: Diagnosis not present

## 2024-01-20 DIAGNOSIS — Z8542 Personal history of malignant neoplasm of other parts of uterus: Secondary | ICD-10-CM | POA: Diagnosis not present

## 2024-01-20 DIAGNOSIS — M436 Torticollis: Secondary | ICD-10-CM | POA: Diagnosis not present

## 2024-01-20 DIAGNOSIS — M50321 Other cervical disc degeneration at C4-C5 level: Secondary | ICD-10-CM | POA: Diagnosis not present

## 2024-01-20 DIAGNOSIS — M25511 Pain in right shoulder: Secondary | ICD-10-CM | POA: Diagnosis not present

## 2024-01-21 DIAGNOSIS — M25512 Pain in left shoulder: Secondary | ICD-10-CM | POA: Diagnosis not present

## 2024-01-21 DIAGNOSIS — M9904 Segmental and somatic dysfunction of sacral region: Secondary | ICD-10-CM | POA: Diagnosis not present

## 2024-01-21 DIAGNOSIS — M545 Low back pain, unspecified: Secondary | ICD-10-CM | POA: Diagnosis not present

## 2024-01-21 DIAGNOSIS — M9902 Segmental and somatic dysfunction of thoracic region: Secondary | ICD-10-CM | POA: Diagnosis not present

## 2024-01-21 DIAGNOSIS — M9903 Segmental and somatic dysfunction of lumbar region: Secondary | ICD-10-CM | POA: Diagnosis not present

## 2024-01-21 DIAGNOSIS — M542 Cervicalgia: Secondary | ICD-10-CM | POA: Diagnosis not present

## 2024-01-21 DIAGNOSIS — M9901 Segmental and somatic dysfunction of cervical region: Secondary | ICD-10-CM | POA: Diagnosis not present

## 2024-01-21 DIAGNOSIS — M9905 Segmental and somatic dysfunction of pelvic region: Secondary | ICD-10-CM | POA: Diagnosis not present

## 2024-01-23 DIAGNOSIS — M9901 Segmental and somatic dysfunction of cervical region: Secondary | ICD-10-CM | POA: Diagnosis not present

## 2024-01-23 DIAGNOSIS — M542 Cervicalgia: Secondary | ICD-10-CM | POA: Diagnosis not present

## 2024-01-23 DIAGNOSIS — M9902 Segmental and somatic dysfunction of thoracic region: Secondary | ICD-10-CM | POA: Diagnosis not present

## 2024-01-23 DIAGNOSIS — M9905 Segmental and somatic dysfunction of pelvic region: Secondary | ICD-10-CM | POA: Diagnosis not present

## 2024-01-23 DIAGNOSIS — M25512 Pain in left shoulder: Secondary | ICD-10-CM | POA: Diagnosis not present

## 2024-01-23 DIAGNOSIS — M9904 Segmental and somatic dysfunction of sacral region: Secondary | ICD-10-CM | POA: Diagnosis not present

## 2024-01-23 DIAGNOSIS — M9903 Segmental and somatic dysfunction of lumbar region: Secondary | ICD-10-CM | POA: Diagnosis not present

## 2024-01-23 DIAGNOSIS — M545 Low back pain, unspecified: Secondary | ICD-10-CM | POA: Diagnosis not present

## 2024-01-27 DIAGNOSIS — M50321 Other cervical disc degeneration at C4-C5 level: Secondary | ICD-10-CM | POA: Diagnosis not present

## 2024-01-27 DIAGNOSIS — M25511 Pain in right shoulder: Secondary | ICD-10-CM | POA: Diagnosis not present

## 2024-01-27 DIAGNOSIS — M436 Torticollis: Secondary | ICD-10-CM | POA: Diagnosis not present

## 2024-01-27 DIAGNOSIS — R531 Weakness: Secondary | ICD-10-CM | POA: Diagnosis not present

## 2024-01-27 DIAGNOSIS — M50322 Other cervical disc degeneration at C5-C6 level: Secondary | ICD-10-CM | POA: Diagnosis not present

## 2024-01-27 DIAGNOSIS — K219 Gastro-esophageal reflux disease without esophagitis: Secondary | ICD-10-CM | POA: Diagnosis not present

## 2024-01-27 DIAGNOSIS — M50323 Other cervical disc degeneration at C6-C7 level: Secondary | ICD-10-CM | POA: Diagnosis not present

## 2024-01-27 DIAGNOSIS — F419 Anxiety disorder, unspecified: Secondary | ICD-10-CM | POA: Diagnosis not present

## 2024-01-27 DIAGNOSIS — F32A Depression, unspecified: Secondary | ICD-10-CM | POA: Diagnosis not present

## 2024-01-27 DIAGNOSIS — M19011 Primary osteoarthritis, right shoulder: Secondary | ICD-10-CM | POA: Diagnosis not present

## 2024-01-27 DIAGNOSIS — Z9889 Other specified postprocedural states: Secondary | ICD-10-CM | POA: Diagnosis not present

## 2024-01-27 DIAGNOSIS — Z8542 Personal history of malignant neoplasm of other parts of uterus: Secondary | ICD-10-CM | POA: Diagnosis not present

## 2024-01-28 DIAGNOSIS — M545 Low back pain, unspecified: Secondary | ICD-10-CM | POA: Diagnosis not present

## 2024-01-28 DIAGNOSIS — M9905 Segmental and somatic dysfunction of pelvic region: Secondary | ICD-10-CM | POA: Diagnosis not present

## 2024-01-28 DIAGNOSIS — M9902 Segmental and somatic dysfunction of thoracic region: Secondary | ICD-10-CM | POA: Diagnosis not present

## 2024-01-28 DIAGNOSIS — M9903 Segmental and somatic dysfunction of lumbar region: Secondary | ICD-10-CM | POA: Diagnosis not present

## 2024-01-28 DIAGNOSIS — M9904 Segmental and somatic dysfunction of sacral region: Secondary | ICD-10-CM | POA: Diagnosis not present

## 2024-01-28 DIAGNOSIS — M9901 Segmental and somatic dysfunction of cervical region: Secondary | ICD-10-CM | POA: Diagnosis not present

## 2024-01-28 DIAGNOSIS — M25512 Pain in left shoulder: Secondary | ICD-10-CM | POA: Diagnosis not present

## 2024-01-28 DIAGNOSIS — M542 Cervicalgia: Secondary | ICD-10-CM | POA: Diagnosis not present

## 2024-01-29 DIAGNOSIS — M9902 Segmental and somatic dysfunction of thoracic region: Secondary | ICD-10-CM | POA: Diagnosis not present

## 2024-01-29 DIAGNOSIS — M9901 Segmental and somatic dysfunction of cervical region: Secondary | ICD-10-CM | POA: Diagnosis not present

## 2024-01-29 DIAGNOSIS — M9905 Segmental and somatic dysfunction of pelvic region: Secondary | ICD-10-CM | POA: Diagnosis not present

## 2024-01-29 DIAGNOSIS — M545 Low back pain, unspecified: Secondary | ICD-10-CM | POA: Diagnosis not present

## 2024-01-29 DIAGNOSIS — M9904 Segmental and somatic dysfunction of sacral region: Secondary | ICD-10-CM | POA: Diagnosis not present

## 2024-01-29 DIAGNOSIS — M9903 Segmental and somatic dysfunction of lumbar region: Secondary | ICD-10-CM | POA: Diagnosis not present

## 2024-01-29 DIAGNOSIS — M25512 Pain in left shoulder: Secondary | ICD-10-CM | POA: Diagnosis not present

## 2024-01-29 DIAGNOSIS — M542 Cervicalgia: Secondary | ICD-10-CM | POA: Diagnosis not present

## 2024-02-04 DIAGNOSIS — M545 Low back pain, unspecified: Secondary | ICD-10-CM | POA: Diagnosis not present

## 2024-02-04 DIAGNOSIS — M9902 Segmental and somatic dysfunction of thoracic region: Secondary | ICD-10-CM | POA: Diagnosis not present

## 2024-02-04 DIAGNOSIS — M9904 Segmental and somatic dysfunction of sacral region: Secondary | ICD-10-CM | POA: Diagnosis not present

## 2024-02-04 DIAGNOSIS — M25512 Pain in left shoulder: Secondary | ICD-10-CM | POA: Diagnosis not present

## 2024-02-04 DIAGNOSIS — M9901 Segmental and somatic dysfunction of cervical region: Secondary | ICD-10-CM | POA: Diagnosis not present

## 2024-02-04 DIAGNOSIS — M9903 Segmental and somatic dysfunction of lumbar region: Secondary | ICD-10-CM | POA: Diagnosis not present

## 2024-02-04 DIAGNOSIS — M9905 Segmental and somatic dysfunction of pelvic region: Secondary | ICD-10-CM | POA: Diagnosis not present

## 2024-02-04 DIAGNOSIS — M542 Cervicalgia: Secondary | ICD-10-CM | POA: Diagnosis not present

## 2024-02-06 DIAGNOSIS — M545 Low back pain, unspecified: Secondary | ICD-10-CM | POA: Diagnosis not present

## 2024-02-06 DIAGNOSIS — M542 Cervicalgia: Secondary | ICD-10-CM | POA: Diagnosis not present

## 2024-02-06 DIAGNOSIS — M9901 Segmental and somatic dysfunction of cervical region: Secondary | ICD-10-CM | POA: Diagnosis not present

## 2024-02-06 DIAGNOSIS — M9905 Segmental and somatic dysfunction of pelvic region: Secondary | ICD-10-CM | POA: Diagnosis not present

## 2024-02-06 DIAGNOSIS — M9904 Segmental and somatic dysfunction of sacral region: Secondary | ICD-10-CM | POA: Diagnosis not present

## 2024-02-06 DIAGNOSIS — M9903 Segmental and somatic dysfunction of lumbar region: Secondary | ICD-10-CM | POA: Diagnosis not present

## 2024-02-06 DIAGNOSIS — M9902 Segmental and somatic dysfunction of thoracic region: Secondary | ICD-10-CM | POA: Diagnosis not present

## 2024-02-06 DIAGNOSIS — M25512 Pain in left shoulder: Secondary | ICD-10-CM | POA: Diagnosis not present

## 2024-02-11 DIAGNOSIS — M9902 Segmental and somatic dysfunction of thoracic region: Secondary | ICD-10-CM | POA: Diagnosis not present

## 2024-02-11 DIAGNOSIS — M9905 Segmental and somatic dysfunction of pelvic region: Secondary | ICD-10-CM | POA: Diagnosis not present

## 2024-02-11 DIAGNOSIS — M9901 Segmental and somatic dysfunction of cervical region: Secondary | ICD-10-CM | POA: Diagnosis not present

## 2024-02-11 DIAGNOSIS — M9904 Segmental and somatic dysfunction of sacral region: Secondary | ICD-10-CM | POA: Diagnosis not present

## 2024-02-11 DIAGNOSIS — M542 Cervicalgia: Secondary | ICD-10-CM | POA: Diagnosis not present

## 2024-02-11 DIAGNOSIS — M545 Low back pain, unspecified: Secondary | ICD-10-CM | POA: Diagnosis not present

## 2024-02-11 DIAGNOSIS — M9903 Segmental and somatic dysfunction of lumbar region: Secondary | ICD-10-CM | POA: Diagnosis not present

## 2024-02-11 DIAGNOSIS — M25512 Pain in left shoulder: Secondary | ICD-10-CM | POA: Diagnosis not present

## 2024-02-13 DIAGNOSIS — M9903 Segmental and somatic dysfunction of lumbar region: Secondary | ICD-10-CM | POA: Diagnosis not present

## 2024-02-13 DIAGNOSIS — M545 Low back pain, unspecified: Secondary | ICD-10-CM | POA: Diagnosis not present

## 2024-02-13 DIAGNOSIS — M9904 Segmental and somatic dysfunction of sacral region: Secondary | ICD-10-CM | POA: Diagnosis not present

## 2024-02-13 DIAGNOSIS — M9901 Segmental and somatic dysfunction of cervical region: Secondary | ICD-10-CM | POA: Diagnosis not present

## 2024-02-13 DIAGNOSIS — M542 Cervicalgia: Secondary | ICD-10-CM | POA: Diagnosis not present

## 2024-02-13 DIAGNOSIS — M9902 Segmental and somatic dysfunction of thoracic region: Secondary | ICD-10-CM | POA: Diagnosis not present

## 2024-02-13 DIAGNOSIS — M25512 Pain in left shoulder: Secondary | ICD-10-CM | POA: Diagnosis not present

## 2024-02-13 DIAGNOSIS — M9905 Segmental and somatic dysfunction of pelvic region: Secondary | ICD-10-CM | POA: Diagnosis not present

## 2024-02-18 DIAGNOSIS — M9901 Segmental and somatic dysfunction of cervical region: Secondary | ICD-10-CM | POA: Diagnosis not present

## 2024-02-18 DIAGNOSIS — M9903 Segmental and somatic dysfunction of lumbar region: Secondary | ICD-10-CM | POA: Diagnosis not present

## 2024-02-18 DIAGNOSIS — M9904 Segmental and somatic dysfunction of sacral region: Secondary | ICD-10-CM | POA: Diagnosis not present

## 2024-02-18 DIAGNOSIS — M25512 Pain in left shoulder: Secondary | ICD-10-CM | POA: Diagnosis not present

## 2024-02-18 DIAGNOSIS — M542 Cervicalgia: Secondary | ICD-10-CM | POA: Diagnosis not present

## 2024-02-18 DIAGNOSIS — M9902 Segmental and somatic dysfunction of thoracic region: Secondary | ICD-10-CM | POA: Diagnosis not present

## 2024-02-18 DIAGNOSIS — M545 Low back pain, unspecified: Secondary | ICD-10-CM | POA: Diagnosis not present

## 2024-02-18 DIAGNOSIS — M9905 Segmental and somatic dysfunction of pelvic region: Secondary | ICD-10-CM | POA: Diagnosis not present
